# Patient Record
Sex: Female | Born: 1980
Health system: Southern US, Community
[De-identification: ages and names within clinical notes are randomized; demographics above are authoritative.]

## PROBLEM LIST (undated history)

## (undated) DIAGNOSIS — Z91018 Allergy to other foods: Secondary | ICD-10-CM

## (undated) DIAGNOSIS — M791 Myalgia, unspecified site: Secondary | ICD-10-CM

## (undated) DIAGNOSIS — I889 Nonspecific lymphadenitis, unspecified: Secondary | ICD-10-CM

## (undated) DIAGNOSIS — N83209 Unspecified ovarian cyst, unspecified side: Secondary | ICD-10-CM

## (undated) DIAGNOSIS — R5383 Other fatigue: Secondary | ICD-10-CM

## (undated) DIAGNOSIS — G8929 Other chronic pain: Secondary | ICD-10-CM

## (undated) DIAGNOSIS — R519 Headache, unspecified: Secondary | ICD-10-CM

## (undated) DIAGNOSIS — J40 Bronchitis, not specified as acute or chronic: Secondary | ICD-10-CM

## (undated) DIAGNOSIS — M797 Fibromyalgia: Secondary | ICD-10-CM

## (undated) DIAGNOSIS — R51 Headache: Secondary | ICD-10-CM

## (undated) DIAGNOSIS — R5382 Chronic fatigue, unspecified: Secondary | ICD-10-CM

## (undated) DIAGNOSIS — F17200 Nicotine dependence, unspecified, uncomplicated: Secondary | ICD-10-CM

## (undated) HISTORY — DX: Allergy to other foods: Z91.018

## (undated) HISTORY — DX: Other fatigue: R53.83

## (undated) HISTORY — DX: Fibromyalgia: M79.7

## (undated) HISTORY — PX: OVARIAN CYST SURGERY: SHX726

## (undated) HISTORY — DX: Nonspecific lymphadenitis, unspecified: I88.9

## (undated) HISTORY — PX: TUBAL LIGATION: SHX77

## (undated) HISTORY — DX: Chronic fatigue, unspecified: R53.82

## (undated) HISTORY — DX: Myalgia, unspecified site: M79.10

## (undated) HISTORY — DX: Nicotine dependence, unspecified, uncomplicated: F17.200

## (undated) HISTORY — PX: APPENDECTOMY: SHX54

---

## 2000-07-27 ENCOUNTER — Emergency Department (HOSPITAL_COMMUNITY): Admission: EM | Admit: 2000-07-27 | Discharge: 2000-07-28 | Payer: Self-pay | Admitting: Internal Medicine

## 2000-07-29 ENCOUNTER — Ambulatory Visit (HOSPITAL_COMMUNITY): Admission: RE | Admit: 2000-07-29 | Discharge: 2000-07-29 | Payer: Self-pay | Admitting: Internal Medicine

## 2000-07-29 ENCOUNTER — Encounter: Payer: Self-pay | Admitting: Internal Medicine

## 2000-09-25 ENCOUNTER — Emergency Department (HOSPITAL_COMMUNITY): Admission: EM | Admit: 2000-09-25 | Discharge: 2000-09-26 | Payer: Self-pay | Admitting: *Deleted

## 2000-09-26 ENCOUNTER — Encounter: Payer: Self-pay | Admitting: *Deleted

## 2000-10-09 ENCOUNTER — Emergency Department (HOSPITAL_COMMUNITY): Admission: EM | Admit: 2000-10-09 | Discharge: 2000-10-09 | Payer: Self-pay | Admitting: *Deleted

## 2000-10-09 ENCOUNTER — Encounter: Payer: Self-pay | Admitting: *Deleted

## 2000-10-22 ENCOUNTER — Emergency Department (HOSPITAL_COMMUNITY): Admission: EM | Admit: 2000-10-22 | Discharge: 2000-10-22 | Payer: Self-pay | Admitting: Emergency Medicine

## 2001-01-23 ENCOUNTER — Emergency Department (HOSPITAL_COMMUNITY): Admission: EM | Admit: 2001-01-23 | Discharge: 2001-01-23 | Payer: Self-pay | Admitting: Emergency Medicine

## 2001-05-22 ENCOUNTER — Emergency Department (HOSPITAL_COMMUNITY): Admission: EM | Admit: 2001-05-22 | Discharge: 2001-05-22 | Payer: Self-pay | Admitting: Emergency Medicine

## 2001-05-24 ENCOUNTER — Emergency Department (HOSPITAL_COMMUNITY): Admission: EM | Admit: 2001-05-24 | Discharge: 2001-05-24 | Payer: Self-pay | Admitting: *Deleted

## 2001-07-05 ENCOUNTER — Emergency Department (HOSPITAL_COMMUNITY): Admission: EM | Admit: 2001-07-05 | Discharge: 2001-07-06 | Payer: Self-pay | Admitting: Emergency Medicine

## 2001-07-29 ENCOUNTER — Encounter: Payer: Self-pay | Admitting: *Deleted

## 2001-07-29 ENCOUNTER — Emergency Department (HOSPITAL_COMMUNITY): Admission: EM | Admit: 2001-07-29 | Discharge: 2001-07-29 | Payer: Self-pay | Admitting: *Deleted

## 2001-10-12 ENCOUNTER — Observation Stay (HOSPITAL_COMMUNITY): Admission: AD | Admit: 2001-10-12 | Discharge: 2001-10-14 | Payer: Self-pay | Admitting: Obstetrics & Gynecology

## 2001-12-30 ENCOUNTER — Emergency Department (HOSPITAL_COMMUNITY): Admission: EM | Admit: 2001-12-30 | Discharge: 2001-12-30 | Payer: Self-pay | Admitting: Emergency Medicine

## 2002-06-17 ENCOUNTER — Ambulatory Visit (HOSPITAL_COMMUNITY): Admission: AD | Admit: 2002-06-17 | Discharge: 2002-06-17 | Payer: Self-pay | Admitting: Obstetrics & Gynecology

## 2002-06-27 ENCOUNTER — Observation Stay (HOSPITAL_COMMUNITY): Admission: RE | Admit: 2002-06-27 | Discharge: 2002-06-28 | Payer: Self-pay | Admitting: Obstetrics & Gynecology

## 2002-06-30 ENCOUNTER — Observation Stay (HOSPITAL_COMMUNITY): Admission: RE | Admit: 2002-06-30 | Discharge: 2002-07-01 | Payer: Self-pay | Admitting: Obstetrics & Gynecology

## 2002-07-08 ENCOUNTER — Ambulatory Visit (HOSPITAL_COMMUNITY): Admission: AD | Admit: 2002-07-08 | Discharge: 2002-07-08 | Payer: Self-pay | Admitting: Obstetrics & Gynecology

## 2002-07-22 ENCOUNTER — Inpatient Hospital Stay (HOSPITAL_COMMUNITY): Admission: RE | Admit: 2002-07-22 | Discharge: 2002-07-24 | Payer: Self-pay | Admitting: Obstetrics & Gynecology

## 2002-11-06 ENCOUNTER — Encounter: Payer: Self-pay | Admitting: Obstetrics and Gynecology

## 2002-11-06 ENCOUNTER — Ambulatory Visit (HOSPITAL_COMMUNITY): Admission: RE | Admit: 2002-11-06 | Discharge: 2002-11-06 | Payer: Self-pay | Admitting: Obstetrics and Gynecology

## 2011-01-10 ENCOUNTER — Emergency Department (HOSPITAL_COMMUNITY): Payer: Self-pay

## 2011-01-10 ENCOUNTER — Emergency Department (HOSPITAL_COMMUNITY)
Admission: EM | Admit: 2011-01-10 | Discharge: 2011-01-10 | Disposition: A | Discharge status: Home / Self Care | Payer: Self-pay | Attending: Emergency Medicine | Admitting: Emergency Medicine

## 2011-01-10 ENCOUNTER — Encounter: Payer: Self-pay | Admitting: Emergency Medicine

## 2011-01-10 DIAGNOSIS — N390 Urinary tract infection, site not specified: Secondary | ICD-10-CM | POA: Insufficient documentation

## 2011-01-10 DIAGNOSIS — R51 Headache: Secondary | ICD-10-CM

## 2011-01-10 DIAGNOSIS — J111 Influenza due to unidentified influenza virus with other respiratory manifestations: Secondary | ICD-10-CM | POA: Insufficient documentation

## 2011-01-10 DIAGNOSIS — S0990XA Unspecified injury of head, initial encounter: Secondary | ICD-10-CM | POA: Insufficient documentation

## 2011-01-10 DIAGNOSIS — IMO0002 Reserved for concepts with insufficient information to code with codable children: Secondary | ICD-10-CM | POA: Insufficient documentation

## 2011-01-10 DIAGNOSIS — R059 Cough, unspecified: Secondary | ICD-10-CM | POA: Insufficient documentation

## 2011-01-10 DIAGNOSIS — R42 Dizziness and giddiness: Secondary | ICD-10-CM | POA: Insufficient documentation

## 2011-01-10 DIAGNOSIS — F17200 Nicotine dependence, unspecified, uncomplicated: Secondary | ICD-10-CM | POA: Insufficient documentation

## 2011-01-10 DIAGNOSIS — R05 Cough: Secondary | ICD-10-CM | POA: Insufficient documentation

## 2011-01-10 HISTORY — DX: Unspecified ovarian cyst, unspecified side: N83.209

## 2011-01-10 LAB — URINALYSIS, ROUTINE W REFLEX MICROSCOPIC
Bilirubin Urine: NEGATIVE
Glucose, UA: NEGATIVE mg/dL
Ketones, ur: NEGATIVE mg/dL
Protein, ur: NEGATIVE mg/dL
pH: 6 (ref 5.0–8.0)

## 2011-01-10 LAB — COMPREHENSIVE METABOLIC PANEL
BUN: 7 mg/dL (ref 6–23)
CO2: 30 mEq/L (ref 19–32)
Chloride: 98 mEq/L (ref 96–112)
Creatinine, Ser: 0.57 mg/dL (ref 0.50–1.10)
GFR calc Af Amer: >90 mL/min (ref >90)
GFR calc non Af Amer: >90 mL/min (ref >90)
Total Bilirubin: 0.6 mg/dL (ref 0.3–1.2)

## 2011-01-10 LAB — POCT PREGNANCY, URINE: Preg Test, Ur: NEGATIVE

## 2011-01-10 LAB — CBC
HCT: 40.1 % (ref 36.0–46.0)
MCH: 32.1 pg (ref 26.0–34.0)
MCV: 89.5 fL (ref 78.0–100.0)
RDW: 12.8 % (ref 11.5–15.5)
WBC: 3.7 10*3/uL — ABNORMAL LOW (ref 4.0–10.5)

## 2011-01-10 LAB — DIFFERENTIAL
Basophils Absolute: 0 10*3/uL (ref 0.0–0.1)
Eosinophils Absolute: 0 10*3/uL (ref 0.0–0.7)
Eosinophils Relative: 1 % (ref 0–5)
Lymphocytes Relative: 37 % (ref 12–46)
Lymphs Abs: 1.3 10*3/uL (ref 0.7–4.0)
Monocytes Absolute: 0.6 10*3/uL (ref 0.1–1.0)

## 2011-01-10 LAB — URINE MICROSCOPIC-ADD ON

## 2011-01-10 MED ORDER — SODIUM CHLORIDE 0.9 % IV SOLN: INTRAVENOUS | Status: DC

## 2011-01-10 MED ORDER — ALBUTEROL SULFATE HFA 108 (90 BASE) MCG/ACT IN AERS: 2.0000 | INHALATION_SPRAY | RESPIRATORY_TRACT | Status: DC | PRN

## 2011-01-10 MED ORDER — PREDNISONE 20 MG PO TABS
60.0000 mg | ORAL_TABLET | Freq: Once | ORAL | Status: AC
  Administered 2011-01-10: 60 mg via ORAL
  Filled 2011-01-10: qty 3

## 2011-01-10 MED ORDER — ALBUTEROL SULFATE (5 MG/ML) 0.5% IN NEBU
2.5000 mg | INHALATION_SOLUTION | Freq: Once | RESPIRATORY_TRACT | Status: AC
  Administered 2011-01-10: 2.5 mg via RESPIRATORY_TRACT
  Filled 2011-01-10: qty 0.5

## 2011-01-10 MED ORDER — SODIUM CHLORIDE 0.9 % IV BOLUS (SEPSIS)
2000.0000 mL | Freq: Once | INTRAVENOUS | Status: AC
  Administered 2011-01-10: 2000 mL via INTRAVENOUS

## 2011-01-10 MED ORDER — METOCLOPRAMIDE HCL 10 MG PO TABS: 10.0000 mg | ORAL_TABLET | Freq: Four times a day (QID) | ORAL | Status: AC | PRN

## 2011-01-10 MED ORDER — PREDNISONE 20 MG PO TABS: ORAL_TABLET | ORAL | Status: AC

## 2011-01-10 MED ORDER — FENTANYL CITRATE 0.05 MG/ML IJ SOLN
100.0000 ug | Freq: Once | INTRAMUSCULAR | Status: AC
  Administered 2011-01-10: 100 ug via INTRAVENOUS
  Filled 2011-01-10: qty 2

## 2011-01-10 MED ORDER — CEPHALEXIN 500 MG PO CAPS: ORAL_CAPSULE | ORAL | Status: AC

## 2011-01-10 MED ORDER — METOCLOPRAMIDE HCL 5 MG/ML IJ SOLN
10.0000 mg | Freq: Once | INTRAMUSCULAR | Status: AC
  Administered 2011-01-10: 10 mg via INTRAVENOUS
  Filled 2011-01-10: qty 2

## 2011-01-10 NOTE — ED Notes (Signed)
Patient reports generalized body aches, dizziness, and nose pain for 4 days. Patient reports she hit her nose with a door Thursday. Swelling and bruising noted to nose. Patient c/o chills, denies fever. Patient c/o headache since hitting her head, Pupils 5mm and PERRL.

## 2011-01-10 NOTE — ED Provider Notes (Signed)
History   This chart was scribed for Lisa Horn, MD by Clarita Crane. The patient was seen in room APA01/APA01 and the patient's care was started at 10:12AM.   CSN: [id number] Arrival date & time: 01/10/2011  9:36 AM   First MD Initiated Contact with Patient 01/10/11 1009      Chief Complaint  Patient presents with  . Dizziness  . Generalized Body Aches  . Facial Injury    (Consider location/radiation/quality/duration/timing/severity/associated sxs/prior treatment) HPI Lisa Fields is a 30 y.o. female who presents to the Emergency Department complaining of constant moderate to severe flu-like symptoms including fever, sore throat, cough, congestion, myalgias, arthralgias, fatigue, decreased appetite and generalized weakness onset 4 days ago and persistent since. Patient also reports sustaining facial/head injury 3 days ago when she accidentally walked into a door. Denies chest pain, SOB, abdominal pain, States after she was struck by the door she had awaken on the floor by her child. Notes experiencing moderate to severe nasal pain and a constant throbbing HA with associated photophobia since injury was sustained. Also indicates experiencing nausea and vomiting initially following incident but states n/v resolved 2 days ago. Family member states patient has also c/o numbness/tingling of bilateral upper and lower extremities intermittently since the incident occurred. Denies confusion, changes in vision, diplopia.  Past Medical History  Diagnosis Date  . Ovarian cyst     Past Surgical History  Procedure Date  . Tubal ligation   . Appendectomy   . Ovarian cyst surgery     No family history on file.  History  Substance Use Topics  . Smoking status: Current Everyday Smoker -- 0.5 packs/day    Types: Cigarettes  . Smokeless tobacco: Not on file  . Alcohol Use: No    OB History    Grav Para Term Preterm Abortions TAB SAB Ect Mult Living                  Review of  Systems  Constitutional: Negative for fever.       10 Systems reviewed and are negative for acute change except as noted in the HPI.  HENT: Positive for congestion, sore throat and rhinorrhea.        Facial injury.   Eyes: Positive for photophobia. Negative for discharge, redness and visual disturbance.  Respiratory: Positive for cough. Negative for shortness of breath.   Cardiovascular: Negative for chest pain.  Gastrointestinal: Positive for nausea and vomiting. Negative for abdominal pain and diarrhea.  Musculoskeletal: Positive for myalgias and arthralgias. Negative for back pain.  Skin: Negative for rash.  Neurological: Positive for dizziness, weakness, numbness and headaches. Negative for syncope.       Possible LOC  Psychiatric/Behavioral: Negative for confusion.       No behavior change.    Allergies  Ivp dye; Sulfa antibiotics; Codeine; Latex; Morphine and related; and Vicodin  Home Medications   Current Outpatient Rx  Name Route Sig Dispense Refill  . DM-GUAIFENESIN ER 30-600 MG PO TB12 Oral Take 1 tablet by mouth every 12 (twelve) hours.      . IBUPROFEN 800 MG PO TABS Oral Take 800 mg by mouth every 8 (eight) hours as needed. For headaches     . THERA M PLUS PO TABS Oral Take 1 tablet by mouth daily.      . ALBUTEROL SULFATE HFA 108 (90 BASE) MCG/ACT IN AERS Inhalation Inhale 2 puffs into the lungs every 2 (two) hours as needed for wheezing or shortness  of breath (cough). 1 Inhaler 0  . CEPHALEXIN 500 MG PO CAPS  2 caps po bid x 3 days 12 capsule 0  . METOCLOPRAMIDE HCL 10 MG PO TABS Oral Take 1 tablet (10 mg total) by mouth every 6 (six) hours as needed (nausea/headache). 6 tablet 0  . PREDNISONE 20 MG PO TABS  2 tabs po daily x 4 days 8 tablet 0    BP 107/65  Pulse 88  Temp(Src) 98 F (36.7 C) (Oral)  Resp 18  Ht 5\' 9"  (1.753 m)  Wt 130 lb (58.968 kg)  BMI 19.20 kg/m2  SpO2 100%  LMP 01/03/2011  Physical Exam  Nursing note and vitals  reviewed. Constitutional: She is oriented to person, place, and time. She appears well-developed and well-nourished.       Awake, alert, nontoxic appearance.  HENT:  Head: Normocephalic and atraumatic.  Mouth/Throat: Oropharynx is clear and moist. No oropharyngeal exudate.       Nasal bridge is tender but no deformity. No septal hematoma or edema.   Eyes: Conjunctivae and EOM are normal. Pupils are equal, round, and reactive to light. Right eye exhibits no discharge. Left eye exhibits no discharge.       Peripheral vision intact.   Neck: Neck supple.       Neck non-tender.   Cardiovascular: Normal rate and regular rhythm.   No murmur heard. Pulmonary/Chest: Effort normal. No stridor. No respiratory distress. She has wheezes. She has no rales. She exhibits no tenderness.       Scattered rhonchi and wheezes throughout all fields.   Abdominal: Soft. Bowel sounds are normal. She exhibits no mass. There is no tenderness. There is no rebound.  Musculoskeletal: Normal range of motion. She exhibits tenderness.       Baseline ROM, no obvious new focal weakness. No C-spine tenderness. Back diffusely tender. Upper and lower extremities diffusely tender.   Lymphadenopathy:    She has no cervical adenopathy.  Neurological: She is alert and oriented to person, place, and time.       Mental status and motor strength appears baseline for patient and situation. Distal sensation intact. Awake, alert, cooperative and aware of situation; motor strength bilaterally; sensation normal to light touch bilaterally; peripheral visual fields full to confrontation; no facial asymmetry; tongue midline; major cranial nerves appear intact; no pronator drift, normal finger to nose bilaterally.  Skin: Skin is warm and dry. No rash noted.  Psychiatric: She has a normal mood and affect.    ED Course  Procedures (including critical care time)  DIAGNOSTIC STUDIES: Oxygen Saturation is 100% on room air, normal by my  interpretation.    COORDINATION OF CARE: 10:20AM- Patient and family member informed of intent to obtain CT- Head, CXR and additional labs as well as treat current symptoms with medications and nebulizer treatment. Patient agreeable to plan. 1:36PM- PAtient reports she is feeling better at this time following administration of albuterol, Prednisone, IVFs, Reglan and fentanyl via IV. Informed of intent to d/c home with prescription for Reglan. Patient refuses offer to prescribe narcotic pain medication. Patient agrees with current plan set forth.  Pt feels improved after observation and/or treatment in ED.Patient / Family / Caregiver informed of clinical course, understand medical decision-making process, and agree with plan.  Labs Reviewed  CBC - Abnormal; Notable for the following:    WBC 3.7 (*)    All other components within normal limits  DIFFERENTIAL - Abnormal; Notable for the following:  Monocytes Relative 16 (*)    All other components within normal limits  URINALYSIS, ROUTINE W REFLEX MICROSCOPIC - Abnormal; Notable for the following:    Leukocytes, UA TRACE (*)    All other components within normal limits  URINE MICROSCOPIC-ADD ON - Abnormal; Notable for the following:    Squamous Epithelial / LPF MANY (*)    Bacteria, UA MANY (*)    All other components within normal limits  COMPREHENSIVE METABOLIC PANEL  POCT PREGNANCY, URINE   Dg Chest 2 View  01/10/2011  *RADIOLOGY REPORT*  Clinical Data: Cough and headache.  Fall.  Dizziness  CHEST - 2 VIEW  Comparison: None  Findings: The heart size and mediastinal contours are within normal limits.  Both lungs are clear.  The visualized skeletal structures are unremarkable.  IMPRESSION: No active cardiopulmonary abnormalities.  Original Report Authenticated By: Rosealee Albee, M.D.   Ct Head Wo Contrast  01/10/2011  *RADIOLOGY REPORT*  Clinical Data: Dizziness.  Fall for 3 days.  CT HEAD WITHOUT CONTRAST  Technique:   Contiguous axial images were obtained from the base of the skull through the vertex without contrast.  Comparison: None  Findings: The brain has a normal appearance without evidence for hemorrhage, infarction, hydrocephalus, or mass lesion.  There is no extra axial fluid collection. The skull appears intact.  There is mild mucosal thickening involving the maxillary sinuses and ethmoid air cells.  IMPRESSION:  1. No acute intracranial abnormalities. 2.  Mucosal thickening within the sinuses.  Original Report Authenticated By: Rosealee Albee, M.D.     1. Influenza   2. Headache   3. Minor head injury   4. Urinary tract infection       MDM   Ct scan, CXR, medications to treat symptoms, nebulizer treatment.  I doubt any other EMC precluding discharge at this time including, but not necessarily limited to the following:sepsis, meningitis.  I personally performed the services described in this documentation, which was scribed in my presence. The recorded information has been reviewed and considered.    Lisa Horn, MD 01/10/11 2111

## 2011-01-10 NOTE — ED Notes (Signed)
Pt reports hit nose with door 4 days ago.  C/O headache, dizziness, and nose pain.  Pt also reports nonproductive cough, fever, and body aches as well.  Pt alert and oriented.  C/O headache, pupils equal and reactive.  Bruising noted across bridge of nose.

## 2012-06-10 ENCOUNTER — Emergency Department (HOSPITAL_COMMUNITY)
Admission: EM | Admit: 2012-06-10 | Discharge: 2012-06-10 | Disposition: A | Discharge status: Home / Self Care | Payer: PRIVATE HEALTH INSURANCE | Attending: Emergency Medicine | Admitting: Emergency Medicine

## 2012-06-10 ENCOUNTER — Encounter (HOSPITAL_COMMUNITY): Payer: Self-pay

## 2012-06-10 ENCOUNTER — Emergency Department (HOSPITAL_COMMUNITY): Payer: PRIVATE HEALTH INSURANCE

## 2012-06-10 DIAGNOSIS — Y93F9 Activity, other caregiving: Secondary | ICD-10-CM | POA: Insufficient documentation

## 2012-06-10 DIAGNOSIS — S9000XA Contusion of unspecified ankle, initial encounter: Secondary | ICD-10-CM | POA: Insufficient documentation

## 2012-06-10 DIAGNOSIS — Z8742 Personal history of other diseases of the female genital tract: Secondary | ICD-10-CM | POA: Insufficient documentation

## 2012-06-10 DIAGNOSIS — Z91040 Latex allergy status: Secondary | ICD-10-CM | POA: Insufficient documentation

## 2012-06-10 DIAGNOSIS — F17200 Nicotine dependence, unspecified, uncomplicated: Secondary | ICD-10-CM | POA: Insufficient documentation

## 2012-06-10 DIAGNOSIS — Z79899 Other long term (current) drug therapy: Secondary | ICD-10-CM | POA: Insufficient documentation

## 2012-06-10 DIAGNOSIS — Y990 Civilian activity done for income or pay: Secondary | ICD-10-CM | POA: Insufficient documentation

## 2012-06-10 DIAGNOSIS — S9002XA Contusion of left ankle, initial encounter: Secondary | ICD-10-CM

## 2012-06-10 DIAGNOSIS — IMO0002 Reserved for concepts with insufficient information to code with codable children: Secondary | ICD-10-CM | POA: Insufficient documentation

## 2012-06-10 DIAGNOSIS — Y9289 Other specified places as the place of occurrence of the external cause: Secondary | ICD-10-CM | POA: Insufficient documentation

## 2012-06-10 NOTE — ED Provider Notes (Signed)
History  This chart was scribed for Geoffery Lyons, MD by Bennett Scrape, ED Scribe. This patient was seen in room APA03/APA03 and the patient's care was started at 4:50 PM.  CSN: [id number]  Arrival date & time 06/10/12  1619   First MD Initiated Contact with Patient 06/10/12 1649      Chief Complaint  Patient presents with  . Ankle Pain    The history is provided by the patient. No language interpreter was used.    HPI Comments: 4:50 PM Lisa Fields is a 32 y.o. female who presents to the Emergency Department complaining of sudden onset, non-changing, constant left ankle pain described as shooting after a wheel chair tipped over onto her foot. Pt states was transferring a pt at work today when the wheelchair tipped over and landed on her ankle. She reports that the pain is worse with ambulation but denies difficulty ambulating. She reports that there was swelling with the original onset of the injury but this has resolved with the application of ice. She denies any other injuries currently. Pt does not have a h/o chronic medical conditions. Pt is a current everyday smoker but denies alcohol use.  Works as a Licensed conveyancer at [employer]  Past Medical History  Diagnosis Date  . Ovarian cyst     Past Surgical History  Procedure Laterality Date  . Tubal ligation    . Appendectomy    . Ovarian cyst surgery      No family history on file.  History  Substance Use Topics  . Smoking status: Current Every Day Smoker -- 0.50 packs/day    Types: Cigarettes  . Smokeless tobacco: Not on file  . Alcohol Use: No    No OB history provided.  Review of Systems  HENT: Negative for neck pain.   Musculoskeletal: Positive for arthralgias (left ankle pain). Negative for back pain.  Skin: Negative for wound.    Allergies  Ivp dye; Sulfa antibiotics; Codeine; Latex; Morphine and related; and Vicodin  Home Medications   Current Outpatient Rx  Name  Route  Sig   Dispense  Refill  . ibuprofen (ADVIL,MOTRIN) 800 MG tablet   Oral   Take 800 mg by mouth every 8 (eight) hours as needed. For headaches          . Multiple Vitamins-Minerals (MULTIVITAMINS THER. W/MINERALS) TABS   Oral   Take 1 tablet by mouth daily.           Marland Kitchen EXPIRED: albuterol (PROVENTIL HFA;VENTOLIN HFA) 108 (90 BASE) MCG/ACT inhaler   Inhalation   Inhale 2 puffs into the lungs every 2 (two) hours as needed for wheezing or shortness of breath (cough).   1 Inhaler   0     Triage Vitals: BP 107/80  Pulse 106  Temp(Src) 97 F (36.1 C) (Oral)  Resp 20  SpO2 100%  LMP 05/27/2012  Physical Exam  Nursing note and vitals reviewed. Constitutional: She is oriented to person, place, and time. She appears well-developed and well-nourished. No distress.  HENT:  Head: Normocephalic and atraumatic.  Eyes: EOM are normal.  Neck: Neck supple. No tracheal deviation present.  Cardiovascular: Normal rate.   Pulmonary/Chest: Effort normal. No respiratory distress.  Musculoskeletal: Normal range of motion.  Small contusion to medial aspect of left ankle. Tender to palpate. No deformity, and appears otherwise grossly normal. Distal pulsles and sensation are intact.   Neurological: She is alert and oriented to person, place, and time.  Skin:  Skin is warm and dry.  Psychiatric: She has a normal mood and affect. Her behavior is normal.    ED Course  Procedures (including critical care time)  DIAGNOSTIC STUDIES: Oxygen Saturation is 100% on room air, normal by my interpretation.    COORDINATION OF CARE: 4:53 PM- Informed pt of radiology results. Discussed discharge plan which includes ACE bandage, ice and rest with pt and pt agreed to plan. Also advised pt to follow up as needed and pt agreed. Addressed symptoms to return for with pt.    Labs Reviewed - No data to display  Dg Ankle Complete Left  06/10/2012   *RADIOLOGY REPORT*  Clinical Data: Ankle pain  LEFT ANKLE COMPLETE - 3+  VIEW  Comparison: None  Findings: There is no evidence of fracture or dislocation.  There is no evidence of arthropathy or other focal bone abnormality. Soft tissues are unremarkable.  IMPRESSION: Negative exam.   Original Report Authenticated By: Signa Kell, M.D.     No diagnosis found.    MDM  No fractures on xray.  Will treat as sprain / contusion with rest, ice, ace, motrin.  Follow up prn.         I personally performed the services described in this documentation, which was scribed in my presence. The recorded information has been reviewed and is accurate.        Geoffery Lyons, MD 06/11/12 [phone]

## 2012-06-10 NOTE — ED Notes (Signed)
Pt was transferring a pt at the [facility], the wheelchair w/ a pt in it landed on the pt's left ankle.  Pt having sever pain and numbness and shooting pain.  Swelling noted to left foot/ankle.

## 2012-07-30 ENCOUNTER — Encounter (HOSPITAL_COMMUNITY): Payer: Self-pay | Admitting: Emergency Medicine

## 2012-07-30 ENCOUNTER — Emergency Department (HOSPITAL_COMMUNITY)
Admission: EM | Admit: 2012-07-30 | Discharge: 2012-07-30 | Disposition: A | Discharge status: Home / Self Care | Payer: Self-pay | Attending: Emergency Medicine | Admitting: Emergency Medicine

## 2012-07-30 ENCOUNTER — Emergency Department (HOSPITAL_COMMUNITY): Payer: Self-pay

## 2012-07-30 DIAGNOSIS — R110 Nausea: Secondary | ICD-10-CM | POA: Insufficient documentation

## 2012-07-30 DIAGNOSIS — R5383 Other fatigue: Secondary | ICD-10-CM | POA: Insufficient documentation

## 2012-07-30 DIAGNOSIS — F17200 Nicotine dependence, unspecified, uncomplicated: Secondary | ICD-10-CM | POA: Insufficient documentation

## 2012-07-30 DIAGNOSIS — R51 Headache: Secondary | ICD-10-CM | POA: Insufficient documentation

## 2012-07-30 DIAGNOSIS — Z8742 Personal history of other diseases of the female genital tract: Secondary | ICD-10-CM | POA: Insufficient documentation

## 2012-07-30 DIAGNOSIS — R5381 Other malaise: Secondary | ICD-10-CM | POA: Insufficient documentation

## 2012-07-30 DIAGNOSIS — R630 Anorexia: Secondary | ICD-10-CM | POA: Insufficient documentation

## 2012-07-30 DIAGNOSIS — J069 Acute upper respiratory infection, unspecified: Secondary | ICD-10-CM

## 2012-07-30 DIAGNOSIS — Z91040 Latex allergy status: Secondary | ICD-10-CM | POA: Insufficient documentation

## 2012-07-30 DIAGNOSIS — R059 Cough, unspecified: Secondary | ICD-10-CM | POA: Insufficient documentation

## 2012-07-30 DIAGNOSIS — Z79899 Other long term (current) drug therapy: Secondary | ICD-10-CM | POA: Insufficient documentation

## 2012-07-30 DIAGNOSIS — K047 Periapical abscess without sinus: Secondary | ICD-10-CM

## 2012-07-30 DIAGNOSIS — K089 Disorder of teeth and supporting structures, unspecified: Secondary | ICD-10-CM | POA: Insufficient documentation

## 2012-07-30 DIAGNOSIS — R0789 Other chest pain: Secondary | ICD-10-CM | POA: Insufficient documentation

## 2012-07-30 DIAGNOSIS — R6883 Chills (without fever): Secondary | ICD-10-CM | POA: Insufficient documentation

## 2012-07-30 DIAGNOSIS — R05 Cough: Secondary | ICD-10-CM | POA: Insufficient documentation

## 2012-07-30 LAB — CBC WITH DIFFERENTIAL/PLATELET
Basophils Absolute: 0 10*3/uL (ref 0.0–0.1)
Eosinophils Relative: 1 % (ref 0–5)
HCT: 36.5 % (ref 36.0–46.0)
Lymphocytes Relative: 12 % (ref 12–46)
MCH: 33.7 pg (ref 26.0–34.0)
MCHC: 36.7 g/dL — ABNORMAL HIGH (ref 30.0–36.0)
MCV: 91.7 fL (ref 78.0–100.0)
Monocytes Absolute: 0.7 10*3/uL (ref 0.1–1.0)
RDW: 12.4 % (ref 11.5–15.5)

## 2012-07-30 LAB — BASIC METABOLIC PANEL
CO2: 28 mEq/L (ref 19–32)
Calcium: 9.1 mg/dL (ref 8.4–10.5)
Creatinine, Ser: 0.92 mg/dL (ref 0.50–1.10)

## 2012-07-30 MED ORDER — SODIUM CHLORIDE 0.9 % IV SOLN: INTRAVENOUS | Status: DC

## 2012-07-30 MED ORDER — ONDANSETRON HCL 4 MG/2ML IJ SOLN
4.0000 mg | Freq: Once | INTRAMUSCULAR | Status: AC
  Administered 2012-07-30: 4 mg via INTRAVENOUS
  Filled 2012-07-30: qty 2

## 2012-07-30 MED ORDER — SODIUM CHLORIDE 0.9 % IV BOLUS (SEPSIS): 1000.0000 mL | Freq: Once | INTRAVENOUS | Status: DC

## 2012-07-30 MED ORDER — CLINDAMYCIN HCL 300 MG PO CAPS: 300.0000 mg | ORAL_CAPSULE | Freq: Three times a day (TID) | ORAL | Status: DC

## 2012-07-30 NOTE — ED Notes (Signed)
zofran given- scanner not working

## 2012-07-30 NOTE — ED Notes (Signed)
Pt c/o sudden sore throat and jaw pain. Has current dental abscess, supposed to have surgery on tooth tomorrow. gen weakness noted. Hx of strep. Denies urinary /gi sx's.

## 2012-07-30 NOTE — ED Provider Notes (Signed)
History    This chart was scribed for Shelda Jakes, MD, MD by Ashley Jacobs, ED Scribe. The patient was seen in room APA10/APA10 and the patient's care was started at 6:45 PM.  CSN: [id number] Arrival date & time 07/30/12  1824     Chief Complaint  Patient presents with  . Sore Throat  . Weakness    The history is provided by the patient, a significant other and medical records. No language interpreter was used.   HPI Comments: Lisa Fields is a 32 y.o. female who presents to the Emergency Department complaining of moderate constant pain in R upper wisdom tooth with onset several weeks ago and progressively worsened 2 days pta.  Pt also complains of severe cough and sore throat that presented yesterday that is gradually worsening.  Pt was dx with an abscess R upper wisdom tooth and is currently taking ibuprofen for pain with no relief. Pt reports bilateral cranial pressure towards her forehead region. Pt reports that she is allergic to codeine, Vicodin, sulfa antibiotics, latex, and IVP dye( developed streaks if administered). Pt reports watering eyes,weakness, feeling cold and" sour bitter taste" in mouth. Pt spouse reports decreased eating and drinking. Pt reports that she has no problem with tramadol.Pt reports having strep several times.Pt's PCP is Dr. Nolon Bussing. Pt has an appt with dentist tomorrow. Past Medical History  Diagnosis Date  . Ovarian cyst    Past Surgical History  Procedure Laterality Date  . Tubal ligation    . Appendectomy    . Ovarian cyst surgery     History reviewed. No pertinent family history. History  Substance Use Topics  . Smoking status: Current Every Day Smoker -- 0.50 packs/day    Types: Cigarettes  . Smokeless tobacco: Not on file  . Alcohol Use: No   OB History   Grav Para Term Preterm Abortions TAB SAB Ect Mult Living                 Review of Systems  Constitutional: Positive for chills, activity change and appetite  change. Negative for fever and fatigue.  HENT: Positive for sore throat. Negative for congestion, rhinorrhea and neck pain.   Eyes: Negative for visual disturbance.  Respiratory: Positive for cough.   Cardiovascular: Positive for chest pain (hruts when breathing). Negative for leg swelling.  Gastrointestinal: Positive for nausea. Negative for vomiting, abdominal pain and diarrhea.  Genitourinary: Negative for dysuria.  Musculoskeletal: Negative for back pain.  Skin: Negative for rash.  Neurological: Positive for headaches.  Hematological: Does not bruise/bleed easily.    Allergies  Ivp dye; Sulfa antibiotics; Codeine; Latex; Morphine and related; and Vicodin  Home Medications   Current Outpatient Rx  Name  Route  Sig  Dispense  Refill  . ibuprofen (ADVIL,MOTRIN) 800 MG tablet   Oral   Take 800 mg by mouth every 8 (eight) hours as needed. For headaches          . penicillin v potassium (VEETID) 500 MG tablet   Oral   Take 500 mg by mouth 4 (four) times daily. (Started on 07/22/2012)         . Phenylephrine-Ibuprofen (ADVIL CONGESTION RELIEF PO)   Oral   Take 2 tablets by mouth every 6 (six) hours as needed. pain         . clindamycin (CLEOCIN) 300 MG capsule   Oral   Take 1 capsule (300 mg total) by mouth 3 (three) times daily.   30  capsule   0    BP 107/66  Pulse 92  Temp(Src) 98.7 F (37.1 C) (Oral)  Resp 17  Ht 5\' 9"  (1.753 m)  Wt 138 lb 2 oz (62.653 kg)  BMI 20.39 kg/m2  SpO2 100%  LMP 07/25/2012 Physical Exam  Nursing note and vitals reviewed. Constitutional: She is oriented to person, place, and time. She appears well-developed. No distress.  HENT:  Mouth/Throat: No oropharyngeal exudate.  Swelling around r upper wisdom tooth  Eyes: Conjunctivae and EOM are normal. Pupils are equal, round, and reactive to light.  Neck: Normal range of motion.  Cardiovascular: Normal rate and regular rhythm.   Abdominal: Soft. Bowel sounds are normal. There is no  tenderness.  Musculoskeletal: She exhibits no edema.  Lymphadenopathy:    She has no cervical adenopathy.  Neurological: She is alert and oriented to person, place, and time. No cranial nerve deficit. She exhibits normal muscle tone. Coordination normal.  Skin: Skin is warm. No rash noted.  Psychiatric: She has a normal mood and affect. Her behavior is normal.    ED Course  Procedures (including critical care time) DIAGNOSTIC STUDIES: Oxygen Saturation is 100% on room air, normal by my interpretation.    COORDINATION OF CARE: 6:46 PM Discussed course of care with pt which includes anti nausea medication,  . Pt understands and agrees.  9:35 Recheck. Pt report feeling better.   Labs Reviewed  CBC WITH DIFFERENTIAL - Abnormal; Notable for the following:    MCHC 36.7 (*)    Neutrophils Relative % 80 (*)    All other components within normal limits  BASIC METABOLIC PANEL - Abnormal; Notable for the following:    GFR calc non Af Amer 82 (*)    All other components within normal limits  RAPID STREP SCREEN  CULTURE, GROUP A STREP   Dg Chest 2 View  07/30/2012   *RADIOLOGY REPORT*  Clinical Data: Sore throat and weakness.  Sudden onset sore throat and jaw pain.  Current dental abscess in the right upper wisdom teeth.  CHEST - 2 VIEW  Comparison: 01/10/2011  Findings: Emphysematous changes and scattered fibrosis in the lungs. The heart size and pulmonary vascularity are normal. The lungs appear clear and expanded without focal air space disease or consolidation. No blunting of the costophrenic angles.  No pneumothorax.  Mediastinal contours appear intact.  Vague opacity over the right lower chest is consistent with soft tissue attenuation.  Mild degenerative changes in the spine.  Incidental note of widened sub acromial spaces in both shoulders.  This is likely to be due to positioning.  IMPRESSION: Emphysematous changes and scattered fibrosis in the lungs.  No evidence of active pulmonary  disease.   Original Report Authenticated By: Burman Nieves, M.D.   Ct Maxillofacial Wo Cm  07/30/2012   *RADIOLOGY REPORT*  Clinical Data: Sore throat.  Right-sided dental pain.  Weakness. The patient is scheduled for right upper wisdom teeth extraction tomorrow.  No IV contrast material given due to contrast allergy.  CT MAXILLOFACIAL WITHOUT CONTRAST  Technique:  Multidetector CT imaging of the maxillofacial structures was performed. Multiplanar CT image reconstructions were also generated.  Comparison: CT head 01/12/2011.  Findings: Multiple dental caries are demonstrated.  Peri apical lucency and bone destruction around the right upper third molar consistent with history of dental abscess.  Focal gas collection in the soft tissues superficial to the right maxilla anterior to the area of bone destruction may represent extension into the soft tissues.  No  discrete abscess collection is identified.  However, lack of contrast material limits evaluation of inflammatory changes.  Mucosal membrane thickening in the maxillary antra and ethmoid air cells consistent with inflammatory change.  No acute air-fluid levels in the paranasal sinuses.  No displaced orbital or facial fractures identified.  Globes and extraocular muscles appear intact and symmetrical.  IMPRESSION: Multiple dental caries.  Peri apical lucency and bone destruction at the right upper third molar consistent with periodontal abscess. Focal gas collection in the overlying soft tissues may represent extension in the soft tissues.   Original Report Authenticated By: Burman Nieves, M.D.   Results for orders placed during the hospital encounter of 07/30/12  RAPID STREP SCREEN      Result Value Range   Streptococcus, Group A Screen (Direct) NEGATIVE  NEGATIVE  CBC WITH DIFFERENTIAL      Result Value Range   WBC 9.4  4.0 - 10.5 K/uL   RBC 3.98  3.87 - 5.11 MIL/uL   Hemoglobin 13.4  12.0 - 15.0 g/dL   HCT 16.1  09.6 - 04.5 %   MCV 91.7  78.0 -  100.0 fL   MCH 33.7  26.0 - 34.0 pg   MCHC 36.7 (*) 30.0 - 36.0 g/dL   RDW 40.9  81.1 - 91.4 %   Platelets 197  150 - 400 K/uL   Neutrophils Relative % 80 (*) 43 - 77 %   Neutro Abs 7.5  1.7 - 7.7 K/uL   Lymphocytes Relative 12  12 - 46 %   Lymphs Abs 1.1  0.7 - 4.0 K/uL   Monocytes Relative 7  3 - 12 %   Monocytes Absolute 0.7  0.1 - 1.0 K/uL   Eosinophils Relative 1  0 - 5 %   Eosinophils Absolute 0.1  0.0 - 0.7 K/uL   Basophils Relative 0  0 - 1 %   Basophils Absolute 0.0  0.0 - 0.1 K/uL  BASIC METABOLIC PANEL      Result Value Range   Sodium 137  135 - 145 mEq/L   Potassium 3.5  3.5 - 5.1 mEq/L   Chloride 102  96 - 112 mEq/L   CO2 28  19 - 32 mEq/L   Glucose, Bld 93  70 - 99 mg/dL   BUN 10  6 - 23 mg/dL   Creatinine, Ser 7.82  0.50 - 1.10 mg/dL   Calcium 9.1  8.4 - 95.6 mg/dL   GFR calc non Af Amer 82 (*) >90 mL/min   GFR calc Af Amer >90  >90 mL/min       1. Tooth abscess   2. URI (upper respiratory infection)     MDM  Workup negative except for evidence of abscess around the right upper molar with soft tissue extension. No evidence of pneumonia. Rapid strep was negative however patient has been taking penicillin so is unlikely to be positive but she wasn't taking that. White count is normal no leukocytosis no sniffing electrolyte abnormalities. CT scan with some limitations due to the fact it can be done with IV dye due to her allergies. How will switch patient from penicillin to clindamycin she has followup with her regular doctor tomorrow. Also think that the other symptoms are a new onset upper respiratory infection and unrelated to the tooth.     I personally performed the services described in this documentation, which was scribed in my presence. The recorded information has been reviewed and is accurate.     Cristiana Yochim  Lilli Few, MD 07/30/12 2204

## 2012-07-30 NOTE — ED Notes (Signed)
Nausea and dry heaving in triage.

## 2012-08-01 LAB — CULTURE, GROUP A STREP

## 2012-10-09 ENCOUNTER — Encounter (HOSPITAL_COMMUNITY): Payer: Self-pay | Admitting: *Deleted

## 2012-10-09 ENCOUNTER — Emergency Department (HOSPITAL_COMMUNITY)
Admission: EM | Admit: 2012-10-09 | Discharge: 2012-10-09 | Disposition: A | Discharge status: Home / Self Care | Payer: PRIVATE HEALTH INSURANCE | Attending: Emergency Medicine | Admitting: Emergency Medicine

## 2012-10-09 DIAGNOSIS — S70261A Insect bite (nonvenomous), right hip, initial encounter: Secondary | ICD-10-CM

## 2012-10-09 DIAGNOSIS — Y9389 Activity, other specified: Secondary | ICD-10-CM | POA: Insufficient documentation

## 2012-10-09 DIAGNOSIS — Z792 Long term (current) use of antibiotics: Secondary | ICD-10-CM | POA: Insufficient documentation

## 2012-10-09 DIAGNOSIS — Z8742 Personal history of other diseases of the female genital tract: Secondary | ICD-10-CM | POA: Insufficient documentation

## 2012-10-09 DIAGNOSIS — S90569A Insect bite (nonvenomous), unspecified ankle, initial encounter: Secondary | ICD-10-CM | POA: Insufficient documentation

## 2012-10-09 DIAGNOSIS — Y9229 Other specified public building as the place of occurrence of the external cause: Secondary | ICD-10-CM | POA: Insufficient documentation

## 2012-10-09 DIAGNOSIS — F17200 Nicotine dependence, unspecified, uncomplicated: Secondary | ICD-10-CM | POA: Insufficient documentation

## 2012-10-09 DIAGNOSIS — Z79899 Other long term (current) drug therapy: Secondary | ICD-10-CM | POA: Insufficient documentation

## 2012-10-09 DIAGNOSIS — Z91040 Latex allergy status: Secondary | ICD-10-CM | POA: Insufficient documentation

## 2012-10-09 DIAGNOSIS — Y990 Civilian activity done for income or pay: Secondary | ICD-10-CM | POA: Insufficient documentation

## 2012-10-09 MED ORDER — DIPHENHYDRAMINE HCL 25 MG PO CAPS: 25.0000 mg | ORAL_CAPSULE | Freq: Once | ORAL | Status: DC

## 2012-10-09 MED ORDER — FAMOTIDINE 20 MG PO TABS
40.0000 mg | ORAL_TABLET | Freq: Once | ORAL | Status: AC
  Administered 2012-10-09: 40 mg via ORAL
  Filled 2012-10-09: qty 2

## 2012-10-09 MED ORDER — FAMOTIDINE 40 MG PO TABS: 40.0000 mg | ORAL_TABLET | Freq: Two times a day (BID) | ORAL | Status: DC | PRN

## 2012-10-09 NOTE — ED Notes (Signed)
Insect bite to rt hip at work in [facility]

## 2012-10-09 NOTE — ED Notes (Signed)
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  Today at 2000, pt was working in a resident's room and felt a bug bite or sting of some sort.  She later noticed it became a raised whelp and the redness around the area had increased.  Her supervisor advised her to seek care.  She is currently itching on her chest, trunk, hip, and her face.

## 2012-10-10 NOTE — ED Provider Notes (Signed)
CSN: [id number]     Arrival date & time 10/09/12  2041 History   First MD Initiated Contact with Patient 10/09/12 2132     No chief complaint on file.  (Consider location/radiation/quality/duration/timing/severity/associated sxs/prior Treatment) HPI Comments: Lisa Fields is a 32 y.o. Female presenting with a suspected insect bite to her right lateral thigh about an hour before arrival tonight. 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  She did not see the insect that bit her.  She did not take any medicines prior to arrival.  She denies cough, shortness of breath, face, mouth or throat swelling.  She reports a history of hives in the past from unknown exposure and states she feels this will progress to hives as the sensation is similar.     The history is provided by the patient.    Past Medical History  Diagnosis Date  . Ovarian cyst    Past Surgical History  Procedure Laterality Date  . Tubal ligation    . Appendectomy    . Ovarian cyst surgery     History reviewed. No pertinent family history. History  Substance Use Topics  . Smoking status: Current Every Day Smoker -- 0.50 packs/day    Types: Cigarettes  . Smokeless tobacco: Not on file  . Alcohol Use: No   OB History   Grav Para Term Preterm Abortions TAB SAB Ect Mult Living                 Review of Systems  Constitutional: Negative for fever and chills.  HENT: Negative for facial swelling.   Respiratory: Negative for shortness of breath and wheezing.   Skin: Positive for color change and wound.  Neurological: Negative for numbness.    Allergies  Ivp dye; Sulfa antibiotics; Codeine; Latex; Morphine and related; and Vicodin  Home Medications   Current Outpatient Rx  Name  Route  Sig  Dispense  Refill   . clindamycin (CLEOCIN) 300 MG capsule   Oral   Take 1 capsule (300 mg total) by mouth 3 (three) times daily.   30 capsule   0   . famotidine (PEPCID) 40 MG tablet   Oral   Take 1 tablet (40 mg total) by mouth 2 (two) times daily as needed.   15 tablet   0   . ibuprofen (ADVIL,MOTRIN) 800 MG tablet   Oral   Take 800 mg by mouth every 8 (eight) hours as needed. For headaches          . penicillin v potassium (VEETID) 500 MG tablet   Oral   Take 500 mg by mouth 4 (four) times daily. (Started on 07/22/2012)         . Phenylephrine-Ibuprofen (ADVIL CONGESTION RELIEF PO)   Oral   Take 2 tablets by mouth every 6 (six) hours as needed. pain          BP 108/65  Pulse 81  Temp(Src) 98.1 F (36.7 C) (Oral)  Resp 20  Ht 5\' 9"  (1.753 m)  Wt 142 lb (64.411 kg)  BMI 20.96 kg/m2  SpO2 97%  LMP 08/08/2012 Physical Exam  Constitutional: She appears well-developed and well-nourished. No distress.  HENT:  Head: Normocephalic.  Mouth/Throat: Oropharynx is clear and moist.  Neck: Neck supple.  Cardiovascular:  Normal rate.   Pulmonary/Chest: Effort normal. She has no wheezes.  Musculoskeletal: Normal range of motion. She exhibits no edema.  Skin: There is erythema.  2 cm diameter round raised papule right lateral thigh with 3 smaller raised papules within the larger area,  Erythema without induration.  No red streaking.  Patient actively scratching abdomen and neck.  No rash or hives noted.    ED Course  Procedures (including critical care time) Labs Review Labs Reviewed - No data to display Imaging Review No results found.  MDM   1. Insect bite of hip with local reaction, right, initial encounter    Pt was offered benadryl but deferred as she wants to complete her shift and this will make her too drowsy.  Will take benadryl once she is home.  She was given pepcid and first dose of prednisone,  Prescriptions for same given.  Encouraged ice pack to site.    Exam and  history c/w insect bite.  No respiratory distress, no signs of anaphylactic reaction.  Encouraged recheck/return here for any worsened sx.     Burgess Amor, PA-C 10/10/12 [phone]

## 2012-10-10 NOTE — ED Provider Notes (Signed)
Medical screening examination/treatment/procedure(s) were performed by non-physician practitioner and as supervising physician I was immediately available for consultation/collaboration.  Donnetta Hutching, MD 10/10/12 2227

## 2013-02-22 ENCOUNTER — Encounter: Payer: Self-pay | Admitting: Family Medicine

## 2013-02-22 ENCOUNTER — Ambulatory Visit (INDEPENDENT_AMBULATORY_CARE_PROVIDER_SITE_OTHER): Payer: 59 | Admitting: Family Medicine

## 2013-02-22 VITALS — Ht 69.0 in | Wt 143.0 lb

## 2013-02-22 DIAGNOSIS — J111 Influenza due to unidentified influenza virus with other respiratory manifestations: Secondary | ICD-10-CM

## 2013-02-22 MED ORDER — ONDANSETRON HCL 8 MG PO TABS: 8.0000 mg | ORAL_TABLET | Freq: Three times a day (TID) | ORAL | Status: DC | PRN

## 2013-02-22 MED ORDER — AZITHROMYCIN 250 MG PO TABS: ORAL_TABLET | ORAL | Status: DC

## 2013-02-22 NOTE — Patient Instructions (Signed)

## 2013-02-22 NOTE — Progress Notes (Signed)
   Subjective:    Patient ID: Lisa Fields, female    DOB: [birth year], 33 y.o.   MRN: [id number]  Cough This is a new problem. The current episode started in the past 7 days. Associated symptoms include a fever, headaches, myalgias and nasal congestion.  severe fatigue Patient relates sinus pressure pain discomfort. She relates coughing discomfort started off few days ago progressively worse now fairly stable but severe fatigue and sinus pain PMH benign She does smoke.   Review of Systems  Constitutional: Positive for fever.  Respiratory: Positive for cough.   Musculoskeletal: Positive for myalgias.  Neurological: Positive for headaches.       Objective:   Physical Exam  Nursing note and vitals reviewed. Constitutional: She appears well-developed.  HENT:  Head: Normocephalic.  Nose: Nose normal.  Mouth/Throat: Oropharynx is clear and moist. No oropharyngeal exudate.  Sinus pain on percussion  Neck: Neck supple.  Cardiovascular: Normal rate and normal heart sounds.   No murmur heard. Pulmonary/Chest: Effort normal and breath sounds normal. She has no wheezes.  Lymphadenopathy:    She has no cervical adenopathy.  Skin: Skin is warm and dry.          Assessment & Plan:  Influenza-the patient was diagnosed with influenza. Patient/family educated about the flu and warning signs to watch for. If difficulty breathing, severe neck pain and stiffness, cyanosis, disorientation, or progressive worsening then immediately get rechecked at that ER. If progressive symptoms be certain to be rechecked. Supportive measures such as Tylenol/ibuprofen was discussed. No aspirin use in children. And influenza home care instruction sheet was given.  Secondary bronchitis-there may even be mild element of sinusitis Z-Pak prescribed followup if ongoing trouble  Patient is to try to quit smoking  Off work the rest of this week through the weekend. There is a possibility she may not be able  to go back to work on Monday. Patient has been out from this past Sunday all the way through next Sunday.

## 2013-02-26 DIAGNOSIS — Z0289 Encounter for other administrative examinations: Secondary | ICD-10-CM

## 2013-03-06 ENCOUNTER — Telehealth: Payer: Self-pay | Admitting: Family Medicine

## 2013-03-06 ENCOUNTER — Encounter: Payer: Self-pay | Admitting: Family Medicine

## 2013-03-06 ENCOUNTER — Ambulatory Visit (INDEPENDENT_AMBULATORY_CARE_PROVIDER_SITE_OTHER): Payer: 59 | Admitting: Family Medicine

## 2013-03-06 VITALS — BP 120/80 | Ht 69.0 in | Wt 142.0 lb

## 2013-03-06 DIAGNOSIS — R51 Headache: Secondary | ICD-10-CM

## 2013-03-06 DIAGNOSIS — R519 Headache, unspecified: Secondary | ICD-10-CM

## 2013-03-06 MED ORDER — CITALOPRAM HYDROBROMIDE 10 MG PO TABS: 10.0000 mg | ORAL_TABLET | Freq: Every day | ORAL | Status: DC

## 2013-03-06 MED ORDER — TOPIRAMATE 25 MG PO TABS
25.0000 mg | ORAL_TABLET | Freq: Two times a day (BID) | ORAL | Status: DC
Start: 2013-03-06 — End: 2013-03-16

## 2013-03-06 NOTE — Progress Notes (Signed)
   Subjective:    Patient ID: Lisa Fields, female    DOB: Aug 13, 1980, 33 y.o.   MRN: [id number]  HPI Comments: Also having moles? Pop up on her scalp and chest. Noticed them about 3 months ago.   Migraine  This is a new problem. The current episode started 1 to 4 weeks ago. The problem occurs daily. The problem has been gradually worsening. The pain is located in the bilateral, occipital, temporal and frontal region. The pain does not radiate. The pain quality is not similar to prior headaches. The quality of the pain is described as throbbing. The pain is moderate. Associated symptoms include blurred vision, dizziness and photophobia. The symptoms are aggravated by bright light, noise, emotional stress and work. Treatments tried: Excedrine Migraine. The treatment provided no relief.   Significant amount of time was spent discussing stress issues both at home that she's having with the custody issue with her child who is in the grandmother's care currently and she hopes to get back under her care. Also discussed the overuse of analgesic medicines and how this can cause rebound headaches.   Review of Systems  Eyes: Positive for blurred vision and photophobia.  Neurological: Positive for dizziness.   headaches do not wake her up in the middle night there is no fevers associated with this.     Objective:   Physical Exam Neck is supple eardrums normal throat is normal lungs are clear hearts regular       Assessment & Plan:  #1 overwhelming stress some elements of depression use low dose Celexa 10 mg daily followup patient in 4 weeks have advised patient to consider counseling more than likely she states her just talk with her sister she denies being suicidal  #2 chronic headaches-not helped by the use of Excedrin Migraine. This is causing rebound headaches she needs to stop using this. Also Topamax 25 mg 1 daily for 7 days then one twice a day. She is to followup in 4 weeks. I do not  feel this patient needs any scanning.

## 2013-03-06 NOTE — Telephone Encounter (Signed)
Patient states that [facility] in [place] did not receive Rx for Celexa

## 2013-03-06 NOTE — Telephone Encounter (Signed)
Sent [facility] to [facility] [place] through [facility] again. Not able to contact patient because home number in Epic is disconnected.

## 2013-03-16 ENCOUNTER — Ambulatory Visit (INDEPENDENT_AMBULATORY_CARE_PROVIDER_SITE_OTHER): Payer: 59 | Admitting: Family Medicine

## 2013-03-16 ENCOUNTER — Encounter: Payer: Self-pay | Admitting: Family Medicine

## 2013-03-16 VITALS — BP 108/70 | Temp 99.4°F | Ht 69.0 in | Wt 141.6 lb

## 2013-03-16 DIAGNOSIS — J310 Chronic rhinitis: Secondary | ICD-10-CM

## 2013-03-16 DIAGNOSIS — J329 Chronic sinusitis, unspecified: Secondary | ICD-10-CM

## 2013-03-16 MED ORDER — BENZONATATE 100 MG PO CAPS: 100.0000 mg | ORAL_CAPSULE | Freq: Three times a day (TID) | ORAL | Status: DC | PRN

## 2013-03-16 MED ORDER — CEFDINIR 300 MG PO CAPS: 300.0000 mg | ORAL_CAPSULE | Freq: Two times a day (BID) | ORAL | Status: DC

## 2013-03-16 NOTE — Progress Notes (Signed)
   Subjective:    Patient ID: Lisa Fields, female    DOB: 06-Nov-1980, 33 y.o.   MRN: [id number]  Fever  This is a new problem. The current episode started in the past 7 days. The maximum temperature noted was 102 to 102.9 F. Associated symptoms include coughing and muscle aches. Associated symptoms comments: Runny nose. She has tried acetaminophen for the symptoms.    tmax 102, no sig headache  Chills  otc took [facility] robitussin and nyquil  Notes a significant sore throat.  Productive of yellowish phlegm.  Review of Systems  Constitutional: Positive for fever.  Respiratory: Positive for cough.        Objective:   Physical Exam Alert moderate malaise. Temp 99.4. Vital stable. H&T moderate his congestion frontal tenderness. Neck supple. Lungs clear. Heart regular in rhythm.       Assessment & Plan:  Impression post viral rhinosinusitis/bronchitis plan Omnicef twice a day 10 days. Symptomatic care discussed. Warning signs discussed. WSL

## 2013-03-30 ENCOUNTER — Ambulatory Visit (INDEPENDENT_AMBULATORY_CARE_PROVIDER_SITE_OTHER): Payer: 59 | Admitting: Family Medicine

## 2013-03-30 ENCOUNTER — Encounter: Payer: Self-pay | Admitting: Family Medicine

## 2013-03-30 VITALS — BP 104/68 | Ht 69.0 in | Wt 141.0 lb

## 2013-03-30 DIAGNOSIS — R519 Headache, unspecified: Secondary | ICD-10-CM

## 2013-03-30 DIAGNOSIS — D492 Neoplasm of unspecified behavior of bone, soft tissue, and skin: Secondary | ICD-10-CM

## 2013-03-30 DIAGNOSIS — R51 Headache: Secondary | ICD-10-CM

## 2013-03-30 MED ORDER — MELATONIN 3 MG PO TABS: 1.0000 | ORAL_TABLET | Freq: Every evening | ORAL | Status: DC | PRN

## 2013-03-30 NOTE — Progress Notes (Signed)
   Subjective:    Patient ID: WEALTHY DANIELSKI, female    DOB: 08-06-1980, 33 y.o.   MRN: [id number]  HPIFollow up on headaches and celexa. Patient states celexa is working good for her.   Headaches. Patient had to stop taking topamax. It made her feet and legs tingle and stomach cramp. Also she was walking into walls when taking. States she felt drunk on the med. Headaches are doing a lot better. She thinks they were stressed induced.   Patient wants to know if it is safe to take melatonin with celexa.   Patient having diarrhea about 3 times every am for the past week. Also having nausea.  She feels this is due to recent antibiotic. She denies blood or mucus in it.     Review of Systems Patient denies any severe headaches no nausea vomiting or diarrhea energy level overall doing pretty good.    Objective:   Physical Exam Lungs clear heart regular       Assessment & Plan:  Patient's headaches are much better probably stress related. No further medications.  Patient moods are doing well on Celexa she is to continue this measures followup in 4 months at that time if things are going well will consider tapering off.  It is okay for her to take melatonin with this  If she starts having bloody mucous or other problems she needs to immediately let us know. Doubt C. difficile currently.  She does have benign growths in her scalp that seemed to be getting worse they catch on her [facility] often bleed I recommend referral to dermatology for removal

## 2013-04-10 ENCOUNTER — Encounter: Payer: Self-pay | Admitting: Family Medicine

## 2013-07-05 ENCOUNTER — Other Ambulatory Visit: Payer: Self-pay | Admitting: Family Medicine

## 2013-07-05 DIAGNOSIS — Z1231 Encounter for screening mammogram for malignant neoplasm of breast: Secondary | ICD-10-CM

## 2013-07-09 ENCOUNTER — Other Ambulatory Visit: Payer: Self-pay | Admitting: Family Medicine

## 2013-07-09 ENCOUNTER — Ambulatory Visit (HOSPITAL_COMMUNITY)
Admission: RE | Admit: 2013-07-09 | Discharge: 2013-07-09 | Disposition: A | Discharge status: Home / Self Care | Payer: 59 | Source: Ambulatory Visit | Attending: Family Medicine | Admitting: Family Medicine

## 2013-07-09 DIAGNOSIS — N6320 Unspecified lump in the left breast, unspecified quadrant: Secondary | ICD-10-CM

## 2013-07-09 DIAGNOSIS — Z1231 Encounter for screening mammogram for malignant neoplasm of breast: Secondary | ICD-10-CM

## 2013-07-18 ENCOUNTER — Encounter: Payer: Self-pay | Admitting: Family Medicine

## 2013-07-18 ENCOUNTER — Ambulatory Visit (INDEPENDENT_AMBULATORY_CARE_PROVIDER_SITE_OTHER): Payer: 59 | Admitting: Family Medicine

## 2013-07-18 VITALS — BP 120/82 | Temp 98.2°F | Ht 69.0 in | Wt 140.4 lb

## 2013-07-18 DIAGNOSIS — J329 Chronic sinusitis, unspecified: Secondary | ICD-10-CM

## 2013-07-18 DIAGNOSIS — H65191 Other acute nonsuppurative otitis media, right ear: Secondary | ICD-10-CM

## 2013-07-18 DIAGNOSIS — H65199 Other acute nonsuppurative otitis media, unspecified ear: Secondary | ICD-10-CM

## 2013-07-18 MED ORDER — AMOXICILLIN-POT CLAVULANATE 875-125 MG PO TABS: 1.0000 | ORAL_TABLET | Freq: Two times a day (BID) | ORAL | Status: DC

## 2013-07-18 NOTE — Progress Notes (Signed)
   Subjective:    Patient ID: Lisa Fields, female    DOB: [birth year], 33 y.o.   MRN: [id number]  Sinusitis This is a new problem. The current episode started in the past 7 days. The problem has been gradually worsening since onset. Maximum temperature: 99.6. The pain is moderate. Associated symptoms include congestion, coughing, ear pain and headaches. (Body aches) Past treatments include acetaminophen and oral decongestants. The treatment provided no relief.   Patient states that she wants to discuss smoking cessation also.    Remote asthma  heADACHE FRONTAL    Review of Systems  HENT: Positive for congestion and ear pain.   Respiratory: Positive for cough.   Neurological: Positive for headaches.       Objective:   Physical Exam  Alert moderate malaise good hydration left TM normal right TM inflamed erythematous neck supple. Lungs clear heart regular in rhythm.      Assessment & Plan:  Impression right otitis media with rhinosinusitis plan encouraged to stop smoking. Augmentin twice a day 10 days. Symptomatic care discussed. WSL

## 2013-07-24 ENCOUNTER — Emergency Department (HOSPITAL_COMMUNITY)
Admission: EM | Admit: 2013-07-24 | Discharge: 2013-07-24 | Disposition: A | Discharge status: Home / Self Care | Payer: 59 | Attending: Emergency Medicine | Admitting: Emergency Medicine

## 2013-07-24 ENCOUNTER — Encounter (HOSPITAL_COMMUNITY): Payer: Self-pay | Admitting: Emergency Medicine

## 2013-07-24 ENCOUNTER — Emergency Department (HOSPITAL_COMMUNITY): Payer: 59

## 2013-07-24 ENCOUNTER — Telehealth: Payer: Self-pay | Admitting: Family Medicine

## 2013-07-24 DIAGNOSIS — Z79899 Other long term (current) drug therapy: Secondary | ICD-10-CM | POA: Insufficient documentation

## 2013-07-24 DIAGNOSIS — R05 Cough: Secondary | ICD-10-CM

## 2013-07-24 DIAGNOSIS — F17200 Nicotine dependence, unspecified, uncomplicated: Secondary | ICD-10-CM | POA: Insufficient documentation

## 2013-07-24 DIAGNOSIS — B9789 Other viral agents as the cause of diseases classified elsewhere: Secondary | ICD-10-CM | POA: Insufficient documentation

## 2013-07-24 DIAGNOSIS — Z91040 Latex allergy status: Secondary | ICD-10-CM | POA: Insufficient documentation

## 2013-07-24 DIAGNOSIS — R059 Cough, unspecified: Secondary | ICD-10-CM

## 2013-07-24 DIAGNOSIS — Z8709 Personal history of other diseases of the respiratory system: Secondary | ICD-10-CM | POA: Insufficient documentation

## 2013-07-24 DIAGNOSIS — B349 Viral infection, unspecified: Secondary | ICD-10-CM

## 2013-07-24 DIAGNOSIS — Z3202 Encounter for pregnancy test, result negative: Secondary | ICD-10-CM | POA: Insufficient documentation

## 2013-07-24 DIAGNOSIS — G8929 Other chronic pain: Secondary | ICD-10-CM | POA: Insufficient documentation

## 2013-07-24 DIAGNOSIS — Z792 Long term (current) use of antibiotics: Secondary | ICD-10-CM | POA: Insufficient documentation

## 2013-07-24 DIAGNOSIS — Z8742 Personal history of other diseases of the female genital tract: Secondary | ICD-10-CM | POA: Insufficient documentation

## 2013-07-24 HISTORY — DX: Other chronic pain: G89.29

## 2013-07-24 HISTORY — DX: Bronchitis, not specified as acute or chronic: J40

## 2013-07-24 HISTORY — DX: Headache, unspecified: R51.9

## 2013-07-24 HISTORY — DX: Headache: R51

## 2013-07-24 LAB — URINALYSIS, ROUTINE W REFLEX MICROSCOPIC
Glucose, UA: NEGATIVE mg/dL
KETONES UR: 15 mg/dL — AB
Leukocytes, UA: NEGATIVE
NITRITE: NEGATIVE
PROTEIN: NEGATIVE mg/dL
Specific Gravity, Urine: >1.03 — ABNORMAL HIGH (ref 1.005–1.030)
UROBILINOGEN UA: 0.2 mg/dL (ref 0.0–1.0)
pH: 5.5 (ref 5.0–8.0)

## 2013-07-24 LAB — I-STAT CHEM 8, ED
BUN: 12 mg/dL (ref 6–23)
CREATININE: 0.8 mg/dL (ref 0.50–1.10)
Calcium, Ion: 1.1 mmol/L — ABNORMAL LOW (ref 1.12–1.23)
Chloride: 101 mEq/L (ref 96–112)
Glucose, Bld: 89 mg/dL (ref 70–99)
HEMATOCRIT: 41 % (ref 36.0–46.0)
HEMOGLOBIN: 13.9 g/dL (ref 12.0–15.0)
POTASSIUM: 3.5 meq/L — AB (ref 3.7–5.3)
SODIUM: 136 meq/L — AB (ref 137–147)
TCO2: 23 mmol/L (ref 0–100)

## 2013-07-24 LAB — I-STAT TROPONIN, ED: TROPONIN I, POC: 0 ng/mL (ref 0.00–0.08)

## 2013-07-24 LAB — D-DIMER, QUANTITATIVE (NOT AT ARMC)

## 2013-07-24 LAB — URINE MICROSCOPIC-ADD ON

## 2013-07-24 LAB — PREGNANCY, URINE: PREG TEST UR: NEGATIVE

## 2013-07-24 MED ORDER — ACETAMINOPHEN 325 MG PO TABS
650.0000 mg | ORAL_TABLET | Freq: Once | ORAL | Status: AC
  Administered 2013-07-24: 650 mg via ORAL
  Filled 2013-07-24: qty 2

## 2013-07-24 MED ORDER — ALBUTEROL SULFATE HFA 108 (90 BASE) MCG/ACT IN AERS: 2.0000 | INHALATION_SPRAY | RESPIRATORY_TRACT | Status: DC | PRN

## 2013-07-24 MED ORDER — SODIUM CHLORIDE 0.9 % IV BOLUS (SEPSIS)
1000.0000 mL | Freq: Once | INTRAVENOUS | Status: AC
  Administered 2013-07-24: 1000 mL via INTRAVENOUS

## 2013-07-24 MED ORDER — ALBUTEROL SULFATE (2.5 MG/3ML) 0.083% IN NEBU
2.5000 mg | INHALATION_SOLUTION | Freq: Once | RESPIRATORY_TRACT | Status: AC
  Administered 2013-07-24: 2.5 mg via RESPIRATORY_TRACT
  Filled 2013-07-24: qty 3

## 2013-07-24 MED ORDER — BENZONATATE 100 MG PO CAPS: 100.0000 mg | ORAL_CAPSULE | Freq: Three times a day (TID) | ORAL | Status: DC | PRN

## 2013-07-24 MED ORDER — SODIUM CHLORIDE 0.9 % IV SOLN
INTRAVENOUS | Status: DC
  Administered 2013-07-24: 17:00:00 via INTRAVENOUS

## 2013-07-24 MED ORDER — IPRATROPIUM-ALBUTEROL 0.5-2.5 (3) MG/3ML IN SOLN
3.0000 mL | Freq: Once | RESPIRATORY_TRACT | Status: AC
  Administered 2013-07-24: 3 mL via RESPIRATORY_TRACT
  Filled 2013-07-24: qty 3

## 2013-07-24 NOTE — ED Notes (Signed)
Pt dxwith Bronchitis, 6/24. On AB therapy. Pain with expiration, dizziness with walking/standing.

## 2013-07-24 NOTE — ED Provider Notes (Signed)
CSN: [id number]     Arrival date & time 07/24/13  1156 History   First MD Initiated Contact with Patient 07/24/13 1337     Chief Complaint  Patient presents with  . Chest Pain  . Cough  . Fatigue      HPI Pt was seen at 1335.   Per pt, c/o gradual onset and persistence of constant runny/stuffy nose, sinus congestion, and cough for the past 1 week, worse over the past 2-3 days. Has been associated with generalized body aches/fatigue, "lightheadedness" when moving positions from laying to standing, and generalized chest "pain." CP worsens with deep breath, palpation of the area, and body position changes. Pt was evaluated by her PMD 6 days ago for same, dx "bronchitis," and rx augmentin. Pt states her symptoms have not improved. Pt continues to smoke cigarettes daily. Pt's family states "the rest of the family had this already" over the past few weeks. Denies fevers, no rash, no palpitations, no SOB, no N/V/D, no abd pain.     Past Medical History  Diagnosis Date  . Ovarian cyst   . Bronchitis   . Chronic headaches    Past Surgical History  Procedure Laterality Date  . Tubal ligation    . Appendectomy    . Ovarian cyst surgery      History  Substance Use Topics  . Smoking status: Current Every Day Smoker -- 0.50 packs/day    Types: Cigarettes  . Smokeless tobacco: Not on file  . Alcohol Use: No    Review of Systems ROS: Statement: All systems negative except as marked or noted in the HPI; Constitutional: +generalized body aches/fatigue, lightheadedness. Negative for fever and chills. ; ; Eyes: Negative for eye pain, redness and discharge. ; ; ENMT: Negative for ear pain, hoarseness, sore throat. +nasal congestion, rhinorrhea, sinus pressure. ; ; Cardiovascular: Negative for chest pain, palpitations, diaphoresis, dyspnea and peripheral edema. ; ; Respiratory: +cough. Negative for wheezing and stridor. ; ; Gastrointestinal: Negative for nausea, vomiting, diarrhea, abdominal pain,  blood in stool, hematemesis, jaundice and rectal bleeding. . ; ; Genitourinary: Negative for dysuria, flank pain and hematuria. ; ; Musculoskeletal: Negative for back pain and neck pain. Negative for swelling and trauma.; ; Skin: Negative for pruritus, rash, abrasions, blisters, bruising and skin lesion.; ; Neuro: Negative for headache and neck stiffness. Negative for altered level of consciousness , altered mental status, extremity weakness, paresthesias, involuntary movement, seizure and syncope.      Allergies  Ivp dye; Sulfa antibiotics; Codeine; Latex; Morphine and related; Topamax; and Vicodin  Home Medications   Prior to Admission medications   Medication Sig Start Date End Date Taking? Authorizing Provider  amoxicillin-clavulanate (AUGMENTIN) 875-125 MG per tablet Take 1 tablet by mouth 2 (two) times daily. 07/18/13 08/01/13 Yes Mikey Kirschner, MD  citalopram (CELEXA) 10 MG tablet Take 10 mg by mouth daily.   Yes Historical Provider, MD  ibuprofen (ADVIL,MOTRIN) 800 MG tablet Take 800 mg by mouth every 8 (eight) hours as needed. For headaches    Yes Historical Provider, MD  Melatonin 3 MG TABS Take 1 tablet (3 mg total) by mouth at bedtime as needed. 03/30/13   Kathyrn Drown, MD   BP 102/68  Pulse 63  Temp(Src) 98.7 F (37.1 C) (Oral)  Resp 22  Ht 5\' 9"  (1.753 m)  Wt 145 lb (65.772 kg)  BMI 21.40 kg/m2  SpO2 100%  LMP 07/24/2013 Physical Exam 1340: Physical examination:  Nursing notes reviewed; Vital signs and  O2 SAT reviewed;  Constitutional: Well developed, Well nourished, Well hydrated, In no acute distress; Head:  Normocephalic, atraumatic; Eyes: EOMI, PERRL, No scleral icterus; ENMT: Mouth and pharynx normal, Mucous membranes moist; Neck: Supple, Full range of motion, No meningeal signs. No lymphadenopathy; Cardiovascular: Regular rate and rhythm, No gallop; Respiratory: Breath sounds coarse & equal bilaterally, No wheezes.  Speaking full sentences with ease, Normal respiratory  effort/excursion; Chest: +bilateral parasternal and anterior chest wall tenderness to palp. No rash, no soft tissue crepitus, no deformity. Movement normal; Abdomen: Soft, Nontender, Nondistended, Normal bowel sounds; Genitourinary: No CVA tenderness; Extremities: Pulses normal, No tenderness, No edema, No calf edema or asymmetry.; Neuro: AA&Ox3, Major CN grossly intact.  Speech clear. No gross focal motor or sensory deficits in extremities. Climbs on and off stretcher easily by herself. Gait steady.; Skin: Color normal, Warm, Dry.    ED Course  Procedures      EKG Interpretation None      MDM  MDM Reviewed: previous chart, nursing note and vitals Reviewed previous: labs Interpretation: labs and x-ray    Date: 07/24/2013 on arrival  Rate: 81  Rhythm: normal sinus rhythm  QRS Axis: normal  Intervals: normal  ST/T Wave abnormalities: normal  Conduction Disutrbances:none  Narrative Interpretation:   Old EKG Reviewed: none available.   Date: 07/24/2013 repeat  Rate: 72  Rhythm: normal sinus rhythm  QRS Axis: normal  Intervals: normal  ST/T Wave abnormalities: normal  Conduction Disutrbances:none  Narrative Interpretation:   Old EKG Reviewed: unchanged from previous EKG today.    Results for orders placed during the hospital encounter of 07/24/13  D-DIMER, QUANTITATIVE      Result Value Ref Range   D-Dimer, Quant <0.27  0.00 - 0.48 ug/mL-FEU  PREGNANCY, URINE      Result Value Ref Range   Preg Test, Ur NEGATIVE  NEGATIVE  URINALYSIS, ROUTINE W REFLEX MICROSCOPIC      Result Value Ref Range   Color, Urine YELLOW  YELLOW   APPearance CLEAR  CLEAR   Specific Gravity, Urine >1.030 (*) 1.005 - 1.030   pH 5.5  5.0 - 8.0   Glucose, UA NEGATIVE  NEGATIVE mg/dL   Hgb urine dipstick MODERATE (*) NEGATIVE   Bilirubin Urine SMALL (*) NEGATIVE   Ketones, ur 15 (*) NEGATIVE mg/dL   Protein, ur NEGATIVE  NEGATIVE mg/dL   Urobilinogen, UA 0.2  0.0 - 1.0 mg/dL   Nitrite NEGATIVE   NEGATIVE   Leukocytes, UA NEGATIVE  NEGATIVE  URINE MICROSCOPIC-ADD ON      Result Value Ref Range   Squamous Epithelial / LPF RARE  RARE   WBC, UA 0-2  <3 WBC/hpf   RBC / HPF 3-6  <3 RBC/hpf   Bacteria, UA FEW (*) RARE  I-STAT CHEM 8, ED      Result Value Ref Range   Sodium 136 (*) 137 - 147 mEq/L   Potassium 3.5 (*) 3.7 - 5.3 mEq/L   Chloride 101  96 - 112 mEq/L   BUN 12  6 - 23 mg/dL   Creatinine, Ser 0.80  0.50 - 1.10 mg/dL   Glucose, Bld 89  70 - 99 mg/dL   Calcium, Ion 1.10 (*) 1.12 - 1.23 mmol/L   TCO2 23  0 - 100 mmol/L   Hemoglobin 13.9  12.0 - 15.0 g/dL   HCT 41.0  36.0 - 46.0 %  I-STAT TROPOININ, ED      Result Value Ref Range   Troponin i, poc 0.00  0.00 - 0.08 ng/mL   Comment 3            Dg Chest 2 View 07/24/2013   CLINICAL DATA:  Chest pain  EXAM: CHEST  2 VIEW  COMPARISON:  Prior radiograph from 07/30/2012  FINDINGS: The cardiac and mediastinal silhouettes are stable in size and contour, and remain within normal limits.  The lungs are normally inflated. No airspace consolidation, pleural effusion, or pulmonary edema is identified. There is no pneumothorax.  No acute osseous abnormality identified.  IMPRESSION: No active cardiopulmonary disease.   Electronically Signed   By: Jeannine Boga M.D.   On: 07/24/2013 14:24    1700:  Workup reassuring. VS per pt's baseline per EPIC chart review. Pt has tol PO well without N/V. Will tx symptomatically at this time. Pt states she wants to go home now. Dx and testing d/w pt and family.  Questions answered.  Verb understanding, agreeable to d/c home with outpt f/u.     Alfonzo Feller, DO 07/26/13 1625

## 2013-07-24 NOTE — ED Notes (Signed)
Pt c/o cough that started about two weeks ago, was seen on 07/18/2013, diagnosed with bronchitis by pcp, has been taking ;augmenton, chest pain started this am, pain is worse with movement and breathing, has been trying to cut down on smoking due to diagnosis of bronchitis,

## 2013-07-24 NOTE — Discharge Instructions (Signed)
°Emergency Department Resource Guide °1) Find a Doctor and Pay Out of Pocket °Although you won't have to find out who is covered by your insurance plan, it is a good idea to ask around and get recommendations. You will then need to call the office and see if the doctor you have chosen will accept you as a new patient and what types of options they offer for patients who are self-pay. Some doctors offer discounts or will set up payment plans for their patients who do not have insurance, but you will need to ask so you aren't surprised when you get to your appointment. ° °2) Contact Your Local Health Department °Not all health departments have doctors that can see patients for sick visits, but many do, so it is worth a call to see if yours does. If you don't know where your local health department is, you can check in your phone book. The CDC also has a tool to help you locate your state's health department, and many state websites also have listings of all of their local health departments. ° °3) Find a Walk-in Clinic °If your illness is not likely to be very severe or complicated, you may want to try a walk in clinic. These are popping up all over the country in pharmacies, drugstores, and shopping centers. They're usually staffed by nurse practitioners or physician assistants that have been trained to treat common illnesses and complaints. They're usually fairly quick and inexpensive. However, if you have serious medical issues or chronic medical problems, these are probably not your best option. ° °No Primary Care Doctor: °- Call [facility] at  832-8000 - they can help you locate a primary care doctor that  accepts your insurance, provides certain services, etc. °- Physician Referral Service- 1-800-533-3463 ° °Chronic Pain Problems: °Organization         Address  Phone   Notes  °[facility]  ([phone]) [phone] Patients need to be referred by their primary care doctor.  ° °Medication  Assistance: °Organization         Address  Phone   Notes  °[place] [facility] [address] °[place], [place] [address] (336) 641-8030 --Must be a resident of [place] °-- Must have NO insurance coverage whatsoever (no Medicaid/ Medicare, etc.) °-- The pt. MUST have a primary care doctor that directs their care regularly and follows them in the community °  °[facility]  (866) 331-1348   °[employer]  (888) 892-1162   ° °Agencies that provide inexpensive medical care: °Organization         Address  Phone   Notes  °[facility]  (336) 832-8035   °[facility]    (336) 832-7272   °[facility] [address] °[place], [place] [address] (336) 832-4777   °[facility] [address], °[place] (336) 271-4999   °[facility]    (336) 373-0678   °[facility]    (336) 272-1050   °[facility] ° [address], [place] Phone:  (336) 832-4444, Fax:  (336) 832-4440 Hours of Operation:  9 am - 6 pm, M-F.  Also accepts Medicaid/Medicare and self-pay.  °[facility] ° [address], [place] Phone: (336) [phone], Fax: (336) [phone]. Hours of Operation:  8:30 am - 5:30 pm, M-F.  Also accepts Medicaid and self-pay.  °[facility] [place] [address], [place] Phone: (336) 878-6027   °[facility] [address], [place], [place] (336)723-1848, Ext. 123 Mondays & Thursdays: 7-9 AM.  First 15 patients are seen on a first come, first serve basis. °  ° °Medicaid-accepting [place] Providers: ° °Organization         Address  Phone   Notes  °[facility] [address], [place] (336) 641-2100 Also accepts self-pay patients.  °[facility] [address], [place] ° (336) 856-9996   °[facility] [address], [place]  (336) 288-8857   °[facility] [address] [place] [address], [place] (336) 299-7000   °Veita Bland [address], [place]  ° (336) 373-1557 Only accepts [place] Access Medicaid patients after they have their name applied to their card.  ° °Self-Pay (no insurance) in [place]: ° °Organization         Address  Phone   Notes  °Sickle Cell Patients, [facility] [address], [place] (336) 832-1970   °[facility] [address], [place] (336) 832-4400   °[facility] ° [address], [place] (336) 992-4800   °[facility]/Dr. Osei-Bonsu ° [address] [place] [address], [place] or [address], [place] (336) [phone] Phone number for both [place] and [place] locations is the same.  °[facility] [address], [place] (336) 299-0000   °[facility] [address] [place] [address], [place] or [address] (336) 852-7530 °(336) 878-2260   °[facility] [address], [place] (336) 350-1642, phone; (336) 294-5005, fax Sees patients 1st and 3rd Saturday of every month.  Must not qualify for public or private insurance (i.e. Medicaid, Medicare, [place] Health Choice, Veterans' Benefits) • Household income should be no more than 200% of the poverty level •The clinic cannot treat you if you are pregnant or think you are pregnant • Sexually transmitted diseases are not treated at the clinic.  ° ° °Dental Care: °Organization         Address  Phone  Notes  °[place] [facility] [address],  (336) 641-6152 Accepts children up to age 21 who are enrolled in Medicaid or [place] Health Choice; pregnant women with a Medicaid card; and children who have applied for Medicaid or [place] Health Choice, but were declined, whose parents can pay a reduced fee at time of service.  °[place]  [facility] [place]  [address], [place] (336) 641-7733 Accepts children up to age 21 who are enrolled in Medicaid or [place] Health Choice; pregnant women with a Medicaid card; and children who have applied for Medicaid or [place] Health Choice, but were declined, whose parents can pay a reduced fee at time of service.  °[facility] ° [address],  (336) 641-4533 Patients are seen by appointment only. Walk-ins are not accepted. [facility] will see patients 18 years of age and older. °Monday - Tuesday (8am-5pm) °Most Wednesdays (8:30-5pm) °$30 per visit, cash only  °[facility] ° [address], [place] (336) 641-4533 Patients are seen by appointment only. Walk-ins are not accepted. [facility] will see patients 18 years of age and older. °One   Wednesday Evening (Monthly: Volunteer Based).  $30 per visit, cash only  °[facility]  (919) [phone] for adults; Children under age 4, call Graduate Pediatric Dentistry at ([phone]) [phone]. Children aged 4-14, please call ([phone]) [phone] to request a pediatric application. ° Dental services are provided in all areas of dental care including fillings, crowns and bridges, complete and partial dentures, implants, gum treatment, root canals, and extractions. Preventive care is also provided. Treatment is provided to both adults and children. °Patients are selected via a lottery and there is often a waiting list. °  °[facility] [address], °[place] ° (336) 763-8833 [email or web address].[email or web address] °  °[facility] [address], [place], [place] (336)723-1848, Ext. 123 Second and Fourth Thursday of each month, opens at 6:30 AM; Clinic ends at 9 AM.  Patients are seen on a first-come first-served basis, and a limited number are seen during each clinic.  ° °[facility] ° [address], [place], [place] (336) [phone]    Eligibility Requirements °You must have lived in [place], [place], or [place] counties for at least the last three months. °  You cannot be eligible for state or federal sponsored healthcare insurance, including Veterans Administration, Medicaid, or Medicare. °  You generally cannot be eligible for healthcare insurance through your employer.  °  How to apply: °Eligibility screenings are held every Tuesday and Wednesday afternoon from 1:00 pm until 4:00 pm. You do not need an appointment for the interview!  °[facility] [address], [place], [place] 336-631-2330   °[facility]  336-342-8273   °[facility]  336-703-3100   °[facility]  336-570-6415   ° °Behavioral Health Resources in the Community: °Intensive Outpatient Programs °Organization         Address  Phone  Notes  °[facility] [address], [place], [place] 336-878-6098   °[facility] [address], [place], [place] 336-832-9800   °[facility] [address], [place], [place] ° 336-882-2125   °[facility] [address],  °[place], [place] 1-800-853-5163 or 336-641-4981   °Substance Abuse Resources °Organization         Address  Phone  Notes  °[facility]  336-882-2125   °[facility]  336-784-9470   °[facility]  336-285-9073   °[facility]  336-845-3988   °Residential & Outpatient Substance Abuse Program  1-800-659-3381   °Psychological Services °Organization         Address  Phone  Notes  °[facility]  336- 832-9600   °[facility]  336- 378-7881   °[facility] [address], [place] 1-800-853-5163 or 336-641-4981   ° °Mobile Crisis Teams °Organization         Address  Phone  Notes  °[facility], Mobile Crisis Care Unit  1-877-626-1772   °Assertive °[facility] ° [address]  [place], [place] 336-834-9664   °Sharon DeEsch [address] °[place] 336-554-5454   ° °Self-Help/Support Groups °Organization         Address  Phone             Notes  °[facility] - variety of support groups  336- 373-1402 Call for more information  °Narcotics Anonymous (NA), [facility] [address], °[place]  2 meetings at this location  ° °  Residential Treatment Programs Organization         Address  Phone  Notes  [facility] [address],    [place]  1-(602) 668-3242   [facility]  [address], [place] [address], [place], [place]   [place], [place] [phone] Admissions: 8am-3pm M-F  [facility] [address],    [place], [name] [phone]   [facility] [address] [place], [place], [place]   [facility] [address]  [place], [place]   [facility] Intensive Outpatient [address], [place], [place]   [facility] [address]  [place], [name] 1-602-120-3573 or 361-246-3342   [facility] [address] [place], [place] Accepts Medicaid  [facility] [address],  [name] 1-(701)503-9451 Substance Abuse/Addiction Treatment   [facility]         Address  Phone  Notes  [facility]  (254)373-9186   Domenic Schwab, PhD [address] [place], [name]   819-503-7817 or (858)[phone]   [facility] [place] [employer] [place], [name] (810)[phone]   [facility] [address] [place], [name] [phone] Insurance/Medicaid/sponsorship through [facility] and Families [address] [place]                                    [place], [name] [phone] [place] [address], [name] [phone]    Dr. Adele Schilder  ([phone]   [facility] Dept. 1) [address] [place] 2) [place] 3)  [address], [place] [phone] [phone]  [phone]   [place] 2175293576 or [phone] (After Hours)      Take over the counter decongestant (such as sudafed), as directed on packaging, for the next week.  Use over the counter normal saline nasal spray, as instructed in the Emergency Department, several times per day for the next 2 weeks.  Take over the counter tylenol and ibuprofen, as directed on packaging, as needed for discomfort. Take the prescriptions as directed. Call your regular medical doctor tomorrow to schedule a follow up appointment in the next 2 days.  Return to the Emergency Department immediately if worsening.

## 2013-07-24 NOTE — Telephone Encounter (Signed)
error 

## 2013-07-24 NOTE — ED Notes (Signed)
Pt tolerating water po and states she feels hungry.

## 2013-07-30 ENCOUNTER — Ambulatory Visit (INDEPENDENT_AMBULATORY_CARE_PROVIDER_SITE_OTHER): Payer: 59 | Admitting: Nurse Practitioner

## 2013-07-30 ENCOUNTER — Encounter: Payer: Self-pay | Admitting: Nurse Practitioner

## 2013-07-30 ENCOUNTER — Ambulatory Visit: Payer: 59 | Admitting: Family Medicine

## 2013-07-30 ENCOUNTER — Emergency Department (HOSPITAL_COMMUNITY)
Admission: EM | Admit: 2013-07-30 | Discharge: 2013-07-30 | Disposition: A | Discharge status: Home / Self Care | Payer: 59 | Attending: Emergency Medicine | Admitting: Emergency Medicine

## 2013-07-30 ENCOUNTER — Encounter (HOSPITAL_COMMUNITY): Payer: Self-pay | Admitting: Emergency Medicine

## 2013-07-30 ENCOUNTER — Emergency Department (HOSPITAL_COMMUNITY): Payer: 59

## 2013-07-30 VITALS — BP 122/84 | Temp 98.0°F | Ht 69.0 in | Wt 125.0 lb

## 2013-07-30 DIAGNOSIS — Z8709 Personal history of other diseases of the respiratory system: Secondary | ICD-10-CM | POA: Insufficient documentation

## 2013-07-30 DIAGNOSIS — Z79899 Other long term (current) drug therapy: Secondary | ICD-10-CM | POA: Insufficient documentation

## 2013-07-30 DIAGNOSIS — Z8742 Personal history of other diseases of the female genital tract: Secondary | ICD-10-CM | POA: Insufficient documentation

## 2013-07-30 DIAGNOSIS — J189 Pneumonia, unspecified organism: Secondary | ICD-10-CM

## 2013-07-30 DIAGNOSIS — R509 Fever, unspecified: Secondary | ICD-10-CM

## 2013-07-30 DIAGNOSIS — R231 Pallor: Secondary | ICD-10-CM | POA: Insufficient documentation

## 2013-07-30 DIAGNOSIS — J159 Unspecified bacterial pneumonia: Secondary | ICD-10-CM | POA: Insufficient documentation

## 2013-07-30 DIAGNOSIS — Z3202 Encounter for pregnancy test, result negative: Secondary | ICD-10-CM | POA: Insufficient documentation

## 2013-07-30 DIAGNOSIS — Z87891 Personal history of nicotine dependence: Secondary | ICD-10-CM | POA: Insufficient documentation

## 2013-07-30 DIAGNOSIS — G8929 Other chronic pain: Secondary | ICD-10-CM | POA: Insufficient documentation

## 2013-07-30 DIAGNOSIS — Z91040 Latex allergy status: Secondary | ICD-10-CM | POA: Insufficient documentation

## 2013-07-30 LAB — COMPREHENSIVE METABOLIC PANEL
ALT: 7 U/L (ref 0–35)
AST: 14 U/L (ref 0–37)
Albumin: 3.8 g/dL (ref 3.5–5.2)
Alkaline Phosphatase: 63 U/L (ref 39–117)
Anion gap: 10 (ref 5–15)
BILIRUBIN TOTAL: 0.4 mg/dL (ref 0.3–1.2)
BUN: 6 mg/dL (ref 6–23)
CALCIUM: 8.8 mg/dL (ref 8.4–10.5)
CHLORIDE: 103 meq/L (ref 96–112)
CO2: 28 meq/L (ref 19–32)
CREATININE: 0.6 mg/dL (ref 0.50–1.10)
GFR calc non Af Amer: >90 mL/min (ref >90)
GLUCOSE: 85 mg/dL (ref 70–99)
Potassium: 4.1 mEq/L (ref 3.7–5.3)
Sodium: 141 mEq/L (ref 137–147)
Total Protein: 7.3 g/dL (ref 6.0–8.3)

## 2013-07-30 LAB — CBC WITH DIFFERENTIAL/PLATELET
Basophils Absolute: 0 10*3/uL (ref 0.0–0.1)
Basophils Relative: 1 % (ref 0–1)
EOS PCT: 2 % (ref 0–5)
Eosinophils Absolute: 0.1 10*3/uL (ref 0.0–0.7)
HEMATOCRIT: 36 % (ref 36.0–46.0)
Hemoglobin: 13.1 g/dL (ref 12.0–15.0)
LYMPHS ABS: 1.6 10*3/uL (ref 0.7–4.0)
LYMPHS PCT: 24 % (ref 12–46)
MCH: 32.8 pg (ref 26.0–34.0)
MCHC: 36.4 g/dL — ABNORMAL HIGH (ref 30.0–36.0)
MCV: 90 fL (ref 78.0–100.0)
MONO ABS: 0.6 10*3/uL (ref 0.1–1.0)
Monocytes Relative: 9 % (ref 3–12)
NEUTROS ABS: 4.2 10*3/uL (ref 1.7–7.7)
Neutrophils Relative %: 64 % (ref 43–77)
Platelets: 259 10*3/uL (ref 150–400)
RBC: 4 MIL/uL (ref 3.87–5.11)
RDW: 12.2 % (ref 11.5–15.5)
WBC: 6.4 10*3/uL (ref 4.0–10.5)

## 2013-07-30 LAB — URINALYSIS, ROUTINE W REFLEX MICROSCOPIC
Bilirubin Urine: NEGATIVE
Glucose, UA: NEGATIVE mg/dL
Hgb urine dipstick: NEGATIVE
KETONES UR: NEGATIVE mg/dL
Nitrite: NEGATIVE
Protein, ur: NEGATIVE mg/dL
Specific Gravity, Urine: <1.005 — ABNORMAL LOW (ref 1.005–1.030)
UROBILINOGEN UA: 0.2 mg/dL (ref 0.0–1.0)
pH: 7 (ref 5.0–8.0)

## 2013-07-30 LAB — URINE MICROSCOPIC-ADD ON

## 2013-07-30 LAB — PREGNANCY, URINE: PREG TEST UR: NEGATIVE

## 2013-07-30 MED ORDER — AZITHROMYCIN 250 MG PO TABS: ORAL_TABLET | ORAL | Status: DC

## 2013-07-30 MED ORDER — IPRATROPIUM-ALBUTEROL 0.5-2.5 (3) MG/3ML IN SOLN
3.0000 mL | Freq: Once | RESPIRATORY_TRACT | Status: AC
  Administered 2013-07-30: 3 mL via RESPIRATORY_TRACT
  Filled 2013-07-30: qty 3

## 2013-07-30 MED ORDER — DEXTROSE 5 % IV SOLN
500.0000 mg | Freq: Once | INTRAVENOUS | Status: AC
  Administered 2013-07-30: 500 mg via INTRAVENOUS
  Filled 2013-07-30: qty 500

## 2013-07-30 MED ORDER — PREDNISONE 10 MG PO TABS: 20.0000 mg | ORAL_TABLET | Freq: Every day | ORAL | Status: DC

## 2013-07-30 MED ORDER — ALBUTEROL SULFATE (2.5 MG/3ML) 0.083% IN NEBU
2.5000 mg | INHALATION_SOLUTION | Freq: Once | RESPIRATORY_TRACT | Status: AC
  Administered 2013-07-30: 2.5 mg via RESPIRATORY_TRACT
  Filled 2013-07-30: qty 3

## 2013-07-30 MED ORDER — DEXTROSE 5 % IV SOLN
1.0000 g | Freq: Once | INTRAVENOUS | Status: AC
  Administered 2013-07-30: 1 g via INTRAVENOUS
  Filled 2013-07-30: qty 10

## 2013-07-30 MED ORDER — ALBUTEROL SULFATE HFA 108 (90 BASE) MCG/ACT IN AERS: 1.0000 | INHALATION_SPRAY | Freq: Four times a day (QID) | RESPIRATORY_TRACT | Status: DC | PRN

## 2013-07-30 NOTE — Discharge Instructions (Signed)
X-ray shows pneumonia in the right lung. Increase fluids. Stop smoking. Prescription for antibiotic to start tomorrow, inhaler, prednisone. Use over-the-counter cough syrup.

## 2013-07-30 NOTE — ED Notes (Addendum)
Sent  From MD office.  With body aches, chest hurts, headache,  Fever at night.  Seen here 6/30 and went today  M[place] for recheck. Says has lost 20 lbs.  Alert,  Cough,  Vomiting, no diarrhea.

## 2013-07-30 NOTE — ED Notes (Signed)
Pt c/o left lower abdominal pain as well as SOB/cough, and n/v. Pt was seen here last week for same symptoms but states symptoms have since worsened. Pt was seen by Dr Wolfgang Phoenix and advised to return to ED.

## 2013-07-30 NOTE — ED Provider Notes (Signed)
CSN: [id number]     Arrival date & time 07/30/13  1129 History  This chart was scribed for Nat Christen, MD by Elby Beck, ED Scribe. This patient was seen in room APA08/APA08 and the patient's care was started at 12:29 PM.   Chief Complaint  Patient presents with  . Weakness    The history is provided by the patient. No language interpreter was used.    HPI Comments: Lisa Fields is a 33 y.o. female who presents to the Emergency Department complaining of a gradually worsening, nonproductive cough over the past 3 weeks. She states that she has been taking Tessalon pearls and using an Albuterol inhaler without relief. She states that she was seen by her PCP, Dr. Sallee Lange, earlier today for her symptoms. She reports associated fatigue, generalized myalgias and generalized abdominal pain. She states that she has lost about 20 pounds over the past 3 weeks. She states that she is a former smoker who quit 1 week ago. She works as a [employer] manager.   Past Medical History  Diagnosis Date  . Ovarian cyst   . Bronchitis   . Chronic headaches    Past Surgical History  Procedure Laterality Date  . Tubal ligation    . Appendectomy    . Ovarian cyst surgery     History reviewed. No pertinent family history. History  Substance Use Topics  . Smoking status: Former Smoker -- 0.50 packs/day    Types: Cigarettes    Start date: 07/23/2013  . Smokeless tobacco: Not on file  . Alcohol Use: No   OB History   Grav Para Term Preterm Abortions TAB SAB Ect Mult Living                 Review of Systems A complete 10 system review of systems was obtained and all systems are negative except as noted in the HPI and PMH.   Allergies  Ivp dye; Sulfa antibiotics; Codeine; Latex; Morphine and related; Topamax; and Vicodin  Home Medications   Prior to Admission medications   Medication Sig Start Date End Date Taking? Authorizing Provider  acetaminophen (TYLENOL) 500 MG tablet Take 1,000 mg by mouth  every 4 (four) hours as needed for moderate pain or fever.   Yes Historical Provider, MD  albuterol (PROVENTIL HFA;VENTOLIN HFA) 108 (90 BASE) MCG/ACT inhaler Inhale 2 puffs into the lungs every 4 (four) hours as needed for wheezing or shortness of breath (or cough). 07/24/13  Yes Alfonzo Feller, DO  benzonatate (TESSALON) 100 MG capsule Take 1 capsule (100 mg total) by mouth 3 (three) times daily as needed for cough. 07/24/13  Yes Alfonzo Feller, DO  citalopram (CELEXA) 10 MG tablet Take 10 mg by mouth daily.   Yes Historical Provider, MD  ibuprofen (ADVIL,MOTRIN) 200 MG tablet Take 400 mg by mouth every 4 (four) hours as needed for fever or moderate pain.   Yes Historical Provider, MD  ibuprofen (ADVIL,MOTRIN) 800 MG tablet Take 800 mg by mouth every 8 (eight) hours as needed. For headaches    Yes Historical Provider, MD  Melatonin 3 MG TABS Take 1 tablet (3 mg total) by mouth at bedtime as needed. 03/30/13  Yes Kathyrn Drown, MD  nicotine polacrilex (NICORETTE) 4 MG gum Take 4 mg by mouth as needed for smoking cessation.   Yes Historical Provider, MD  albuterol (PROVENTIL HFA;VENTOLIN HFA) 108 (90 BASE) MCG/ACT inhaler Inhale 1-2 puffs into the lungs every 6 (six) hours as needed for  wheezing or shortness of breath. 07/30/13   Nat Christen, MD  azithromycin (ZITHROMAX Z-PAK) 250 MG tablet 2 po day one, then 1 daily x 4 days 07/30/13   Nat Christen, MD  predniSONE (DELTASONE) 10 MG tablet Take 2 tablets (20 mg total) by mouth daily. 07/30/13   Nat Christen, MD   Triage Vitals: BP 105/71  Pulse 84  Temp(Src) 97.5 F (36.4 C) (Oral)  Resp 20  Ht 5\' 9"  (1.753 m)  Wt 125 lb (56.7 kg)  BMI 18.45 kg/m2  SpO2 99%  LMP 07/24/2013  Physical Exam  Nursing note and vitals reviewed. Constitutional: She is oriented to person, place, and time. She appears well-developed and well-nourished.  HENT:  Head: Normocephalic and atraumatic.  Eyes: Conjunctivae and EOM are normal. Pupils are equal, round, and  reactive to light.  Neck: Normal range of motion. Neck supple.  Cardiovascular: Normal rate, regular rhythm and normal heart sounds.   Pulmonary/Chest: Effort normal and breath sounds normal. No respiratory distress. She has no wheezes. She has no rales.  Abdominal: Soft. Bowel sounds are normal. There is no tenderness.  Musculoskeletal: Normal range of motion.  Neurological: She is alert and oriented to person, place, and time.  Skin: Skin is warm and dry. There is pallor.  Psychiatric: She has a normal mood and affect. Her behavior is normal.    ED Course  Procedures (including critical care time)  DIAGNOSTIC STUDIES: Oxygen Saturation is 99% on RA, normal by my interpretation.    COORDINATION OF CARE: 12:33 PM- Pt advised of plan for treatment and pt agrees.  Results for orders placed during the hospital encounter of 07/30/13  CBC WITH DIFFERENTIAL      Result Value Ref Range   WBC 6.4  4.0 - 10.5 K/uL   RBC 4.00  3.87 - 5.11 MIL/uL   Hemoglobin 13.1  12.0 - 15.0 g/dL   HCT 36.0  36.0 - 46.0 %   MCV 90.0  78.0 - 100.0 fL   MCH 32.8  26.0 - 34.0 pg   MCHC 36.4 (*) 30.0 - 36.0 g/dL   RDW 12.2  11.5 - 15.5 %   Platelets 259  150 - 400 K/uL   Neutrophils Relative % 64  43 - 77 %   Neutro Abs 4.2  1.7 - 7.7 K/uL   Lymphocytes Relative 24  12 - 46 %   Lymphs Abs 1.6  0.7 - 4.0 K/uL   Monocytes Relative 9  3 - 12 %   Monocytes Absolute 0.6  0.1 - 1.0 K/uL   Eosinophils Relative 2  0 - 5 %   Eosinophils Absolute 0.1  0.0 - 0.7 K/uL   Basophils Relative 1  0 - 1 %   Basophils Absolute 0.0  0.0 - 0.1 K/uL  COMPREHENSIVE METABOLIC PANEL      Result Value Ref Range   Sodium 141  137 - 147 mEq/L   Potassium 4.1  3.7 - 5.3 mEq/L   Chloride 103  96 - 112 mEq/L   CO2 28  19 - 32 mEq/L   Glucose, Bld 85  70 - 99 mg/dL   BUN 6  6 - 23 mg/dL   Creatinine, Ser 0.60  0.50 - 1.10 mg/dL   Calcium 8.8  8.4 - 10.5 mg/dL   Total Protein 7.3  6.0 - 8.3 g/dL   Albumin 3.8  3.5 - 5.2 g/dL    AST 14  0 - 37 U/L   ALT 7  0 - 35 U/L   Alkaline Phosphatase 63  39 - 117 U/L   Total Bilirubin 0.4  0.3 - 1.2 mg/dL   GFR calc non Af Amer >90  >90 mL/min   GFR calc Af Amer >90  >90 mL/min   Anion gap 10  5 - 15  URINALYSIS, ROUTINE W REFLEX MICROSCOPIC      Result Value Ref Range   Color, Urine YELLOW  YELLOW   APPearance CLEAR  CLEAR   Specific Gravity, Urine <1.005 (*) 1.005 - 1.030   pH 7.0  5.0 - 8.0   Glucose, UA NEGATIVE  NEGATIVE mg/dL   Hgb urine dipstick NEGATIVE  NEGATIVE   Bilirubin Urine NEGATIVE  NEGATIVE   Ketones, ur NEGATIVE  NEGATIVE mg/dL   Protein, ur NEGATIVE  NEGATIVE mg/dL   Urobilinogen, UA 0.2  0.0 - 1.0 mg/dL   Nitrite NEGATIVE  NEGATIVE   Leukocytes, UA TRACE (*) NEGATIVE  PREGNANCY, URINE      Result Value Ref Range   Preg Test, Ur NEGATIVE  NEGATIVE  URINE MICROSCOPIC-ADD ON      Result Value Ref Range   Squamous Epithelial / LPF FEW (*) RARE   WBC, UA 0-2  <3 WBC/hpf   Imaging Review Dg Chest 2 View  07/30/2013   CLINICAL DATA:  Weakness, weight loss  EXAM: CHEST  2 VIEW  COMPARISON:  07/24/2013, 07/30/2012  FINDINGS: Normal heart size, mediastinal contours, and pulmonary vascularity.  Atelectasis versus infiltrate medial RIGHT lower lobe unchanged since prior exam but new since 2014.  Remaining lungs appear hyperinflated but clear.  No pleural effusion or pneumothorax.  No acute osseous findings.  IMPRESSION: Hyperinflated lungs with atelectasis versus consolidation in medial RIGHT lower lobe.   Electronically Signed   By: Lavonia Dana M.D.   On: 07/30/2013 14:00     EKG Interpretation None      MDM   Final diagnoses:  Community acquired pneumonia    Patient is hemodynamically stable. Chest x-ray reveals a medial right lobe consolidation. Rx IV Zithromax, IV Rocephin, Albuterol/Atrovent nebulizer treatment.   Discharge medications Zithromax, albuterol inhaler, prednisone.   I personally performed the services described in this  documentation, which was scribed in my presence. The recorded information has been reviewed and is accurate.   Nat Christen, MD 07/31/13 [phone]

## 2013-08-01 ENCOUNTER — Encounter: Payer: Self-pay | Admitting: Nurse Practitioner

## 2013-08-01 NOTE — Progress Notes (Signed)
Subjective:  Presents for complaints of persistent cough after being seen at the local ED on 6/30 and diagnosed with cough/viral illness. Symptoms have not improved in fact cough is worse especially at nighttime. Nausea with loss of appetite, taking fluids well. Weakness. Joint and muscle aches. Nonproductive cough. Difficulty sleeping due to tightness in her chest with deep breath or cough. No wheezing. No reflux. Mild upper mid abdominal pain. Bowels normal limit. Saw slight improvement wash was on Augmentin, within 2 days of finishing her symptoms came back and were worse. No sore throat. Ethmoid sinus area headache. No urinary frequency or dysuria. No vaginal discharge. Just finished her cycle 2 days ago. Uses pads, no tampons. Has had a low-grade fever Max temp 100.4.  Objective:   BP 122/84  Temp(Src) 98 F (36.7 C)  Ht 5\' 9"  (1.753 m)  Wt 125 lb (56.7 kg)  BMI 18.45 kg/m2  LMP 07/24/2013 NAD. Alert, oriented. Very dramatic during exam, difficulty assessing actual level of pain. TMs mild clear effusion, no erythema. Pharynx clear. Neck supple with mild soft anterior adenopathy. Lungs clear. Heart regular rate rhythm. No wheezing or tachypnea noted. Patient taking frequent deep breaths and complaining of shortness of breath. Also generalized chest wall pain worse with coughing. Complaints of pain even with very light touch her chest wall or abdomen. At one point patient states if she felt like she could not sit up anymore and had to lay down.  Assessment:Febrile illness  Plan: Difficulty assessing the true level of patient's illness due to increased response to pain. Patient sent directly to ER for further workup. Our office contacted the triage nurse. Recheck here as needed. Patient states she could drive herself to ER.

## 2013-11-02 ENCOUNTER — Ambulatory Visit (INDEPENDENT_AMBULATORY_CARE_PROVIDER_SITE_OTHER): Payer: 59 | Admitting: Family Medicine

## 2013-11-02 ENCOUNTER — Encounter: Payer: Self-pay | Admitting: Family Medicine

## 2013-11-02 VITALS — BP 102/70 | Temp 98.4°F | Ht 69.0 in | Wt 138.5 lb

## 2013-11-02 DIAGNOSIS — B349 Viral infection, unspecified: Secondary | ICD-10-CM

## 2013-11-02 DIAGNOSIS — J029 Acute pharyngitis, unspecified: Secondary | ICD-10-CM

## 2013-11-02 LAB — POCT RAPID STREP A (OFFICE): Rapid Strep A Screen: NEGATIVE

## 2013-11-02 MED ORDER — CITALOPRAM HYDROBROMIDE 10 MG PO TABS: 10.0000 mg | ORAL_TABLET | Freq: Every day | ORAL | Status: DC

## 2013-11-02 MED ORDER — DOXYCYCLINE HYCLATE 100 MG PO CAPS: 100.0000 mg | ORAL_CAPSULE | Freq: Two times a day (BID) | ORAL | Status: DC

## 2013-11-02 MED ORDER — TRAMADOL HCL 50 MG PO TABS: 50.0000 mg | ORAL_TABLET | Freq: Four times a day (QID) | ORAL | Status: DC | PRN

## 2013-11-02 NOTE — Progress Notes (Signed)
   Subjective:    Patient ID: Lisa Fields, female    DOB: 1980-07-20, 33 y.o.   MRN: [id number]  Sore Throat  This is a new problem. The current episode started in the past 7 days. The problem has been unchanged. Neither side of throat is experiencing more pain than the other. The maximum temperature recorded prior to her arrival was 101 - 101.9 F. The pain is severe. Associated symptoms include swollen glands and trouble swallowing. Pertinent negatives include no congestion, ear pain or shortness of breath. Exposure to: MRSA. Treatments tried: theraflu, Nyquil. The treatment provided no relief.  Patient states she has no other concerns at this time.     Review of Systems  Constitutional: Positive for chills and fatigue. Negative for fever and activity change.  HENT: Positive for sore throat and trouble swallowing. Negative for congestion, ear pain and rhinorrhea.   Eyes: Negative for discharge.  Respiratory: Negative for shortness of breath and wheezing.   Cardiovascular: Negative for chest pain.       Objective:   Physical Exam Eardrums normal throat is normal neck is supple subjective sore throat lungs clear no crackles       Assessment & Plan:  Viral syndrome/pharyngitis/if not doing better by early next week then I would recommend CBC and possibly other testing

## 2013-11-03 LAB — STREP A DNA PROBE: GASP: NEGATIVE

## 2013-11-05 ENCOUNTER — Telehealth: Payer: Self-pay | Admitting: Family Medicine

## 2013-11-05 DIAGNOSIS — J029 Acute pharyngitis, unspecified: Secondary | ICD-10-CM

## 2013-11-05 NOTE — Telephone Encounter (Signed)
Patient notified that bloodwork has been ordered and she stated she will report to the lab and have it done today.

## 2013-11-05 NOTE — Telephone Encounter (Signed)
First step is doing some blood work she needs to do a CBC and Monospot. Await the findings of this may need to recheck her later in the week if fever is not breaking. You can let the patient know I am happy to receive her sooner but blood work is what is necessary at this point. If she can get it drawn today that would be great. We should be up and notify her with him 48 hours of the results

## 2013-11-05 NOTE — Telephone Encounter (Signed)
Pt was seen 10/9 an was told if she was not better by Monday to call the  Office and let us know. Was told she would have to go get BW done.  Can we put in the order and call the pt when sent

## 2013-11-05 NOTE — Telephone Encounter (Signed)
Pt states her lumph nodes are swollen, sore throat, can only eat soup because crackers and bread get hung in her throat, no trouble breathing, fever at night 101, fatigue, sleeping a lot.

## 2013-11-06 LAB — CBC WITH DIFFERENTIAL/PLATELET
BASOS PCT: 0 % (ref 0–1)
Basophils Absolute: 0 10*3/uL (ref 0.0–0.1)
Eosinophils Absolute: 0.1 10*3/uL (ref 0.0–0.7)
Eosinophils Relative: 1 % (ref 0–5)
HEMATOCRIT: 38.3 % (ref 36.0–46.0)
HEMOGLOBIN: 13.8 g/dL (ref 12.0–15.0)
LYMPHS ABS: 2.4 10*3/uL (ref 0.7–4.0)
LYMPHS PCT: 34 % (ref 12–46)
MCH: 32.2 pg (ref 26.0–34.0)
MCHC: 36 g/dL (ref 30.0–36.0)
MCV: 89.5 fL (ref 78.0–100.0)
MONO ABS: 0.6 10*3/uL (ref 0.1–1.0)
MONOS PCT: 8 % (ref 3–12)
NEUTROS ABS: 4 10*3/uL (ref 1.7–7.7)
NEUTROS PCT: 57 % (ref 43–77)
Platelets: 307 10*3/uL (ref 150–400)
RBC: 4.28 MIL/uL (ref 3.87–5.11)
RDW: 13.2 % (ref 11.5–15.5)
WBC: 7.1 10*3/uL (ref 4.0–10.5)

## 2013-11-06 LAB — MONONUCLEOSIS SCREEN: MONO SCREEN: NEGATIVE

## 2013-11-09 ENCOUNTER — Other Ambulatory Visit: Payer: Self-pay | Admitting: *Deleted

## 2013-11-09 MED ORDER — DOXYCYCLINE HYCLATE 100 MG PO CAPS: 100.0000 mg | ORAL_CAPSULE | Freq: Two times a day (BID) | ORAL | Status: DC

## 2013-11-09 NOTE — Telephone Encounter (Signed)
Patient notified and verbalized understanding. 

## 2013-11-19 ENCOUNTER — Encounter: Payer: Self-pay | Admitting: Family Medicine

## 2013-11-19 ENCOUNTER — Ambulatory Visit (INDEPENDENT_AMBULATORY_CARE_PROVIDER_SITE_OTHER): Payer: 59 | Admitting: Family Medicine

## 2013-11-19 ENCOUNTER — Ambulatory Visit (HOSPITAL_COMMUNITY)
Admission: RE | Admit: 2013-11-19 | Discharge: 2013-11-19 | Disposition: A | Discharge status: Home / Self Care | Payer: 59 | Source: Ambulatory Visit | Attending: Family Medicine | Admitting: Family Medicine

## 2013-11-19 VITALS — Temp 98.2°F | Wt 139.8 lb

## 2013-11-19 DIAGNOSIS — R531 Weakness: Secondary | ICD-10-CM | POA: Insufficient documentation

## 2013-11-19 DIAGNOSIS — R05 Cough: Secondary | ICD-10-CM | POA: Insufficient documentation

## 2013-11-19 DIAGNOSIS — J208 Acute bronchitis due to other specified organisms: Secondary | ICD-10-CM

## 2013-11-19 MED ORDER — CLARITHROMYCIN 500 MG PO TABS: 500.0000 mg | ORAL_TABLET | Freq: Two times a day (BID) | ORAL | Status: DC

## 2013-11-19 NOTE — Progress Notes (Signed)
   Subjective:    Patient ID: Lisa Fields, female    DOB: [birth year], 33 y.o.   MRN: [id number]  Cough This is a new problem. The current episode started 1 to 4 weeks ago. Associated symptoms include a fever and myalgias. She has tried prescription cough suppressant (doxycycline) for the symptoms.   She's been taking the doxycycline. Originally it was prescribed for the possibility of MRSA should it come back but when she spoke with the nurse regarding her lab results today instructed her to go ahead and take the doxycycline. I don't believe it has hurt her but I also don't believe it has helped her. She does smoke she notes she needs to quit.   Review of Systems  Constitutional: Positive for fever.  Respiratory: Positive for cough.   Musculoskeletal: Positive for myalgias.       Objective:   Physical Exam  Nursing note and vitals reviewed. Constitutional: She appears well-developed.  HENT:  Head: Normocephalic.  Nose: Nose normal.  Mouth/Throat: Oropharynx is clear and moist. No oropharyngeal exudate.  Neck: Neck supple.  Cardiovascular: Normal rate and normal heart sounds.   No murmur heard. Pulmonary/Chest: Effort normal and breath sounds normal. She has no wheezes.  Lymphadenopathy:    She has no cervical adenopathy.  Skin: Skin is warm and dry.   I do not hear any crackles in her lungs she does have a deep cough and a persistent cough.       Assessment & Plan:  I believe this patient needs to take the next several days off. I believe she needs to rest take the antibiotic that was prescribed today in follow-up if progressive symptoms we will also go ahead and do a chest x-ray.

## 2013-12-26 ENCOUNTER — Ambulatory Visit (INDEPENDENT_AMBULATORY_CARE_PROVIDER_SITE_OTHER): Payer: 59 | Admitting: Family Medicine

## 2013-12-26 ENCOUNTER — Encounter: Payer: Self-pay | Admitting: Family Medicine

## 2013-12-26 VITALS — BP 100/70 | Temp 98.7°F | Ht 69.0 in | Wt 140.0 lb

## 2013-12-26 DIAGNOSIS — J329 Chronic sinusitis, unspecified: Secondary | ICD-10-CM

## 2013-12-26 MED ORDER — AZITHROMYCIN 250 MG PO TABS: ORAL_TABLET | ORAL | Status: DC

## 2013-12-26 NOTE — Progress Notes (Signed)
   Subjective:    Patient ID: Lisa Fields, female    DOB: October 27, 1980, 33 y.o.   MRN: [id number]  Cough This is a new problem. The current episode started in the past 7 days. The problem has been gradually worsening. The problem occurs constantly. The cough is productive of sputum. Associated symptoms include headaches and myalgias. Associated symptoms comments: Congestion . Nothing aggravates the symptoms. She has tried OTC cough suppressant (Zicam) for the symptoms. The treatment provided no relief.  Patient has no other concerns at this time.   Prod green phlegnm  No sig fever  Frontal headaches  Pneum does smoke  Trying to quit    Review of Systems  Respiratory: Positive for cough.   Musculoskeletal: Positive for myalgias.  Neurological: Positive for headaches.       Objective:   Physical Exam  Alert mild malaise. Vital stable frontal maxillary tenderness. Pharynx normal neck supple. Lungs no wheezes some rhonchi no crackles no tachypnea heart regular rhythm.      Assessment & Plan:  Impression post viral rhinosinusitis/bronchitis plan antibiotics prescribed. Symptomatic care discussed. Warning signs discussed. WSL

## 2013-12-31 ENCOUNTER — Telehealth: Payer: Self-pay | Admitting: Family Medicine

## 2013-12-31 MED ORDER — AMOXICILLIN-POT CLAVULANATE 875-125 MG PO TABS: 1.0000 | ORAL_TABLET | Freq: Two times a day (BID) | ORAL | Status: DC

## 2013-12-31 NOTE — Telephone Encounter (Signed)
Per Dr Nicki Reaper, called in another antibiotic. Told pt to call us back if s/s persist or get worst. Pt verbalized understanding.

## 2013-12-31 NOTE — Telephone Encounter (Signed)
Patient was seen on 12/26/13 for rhinosinusitis.  She is still having cough, head congestion, chest pain, dark green mucous.  Please advise.   [facility].

## 2014-01-04 DIAGNOSIS — Z029 Encounter for administrative examinations, unspecified: Secondary | ICD-10-CM

## 2014-04-30 ENCOUNTER — Encounter: Payer: Self-pay | Admitting: Family Medicine

## 2014-04-30 ENCOUNTER — Ambulatory Visit (INDEPENDENT_AMBULATORY_CARE_PROVIDER_SITE_OTHER): Payer: 59 | Admitting: Family Medicine

## 2014-04-30 ENCOUNTER — Ambulatory Visit (HOSPITAL_COMMUNITY)
Admission: RE | Admit: 2014-04-30 | Discharge: 2014-04-30 | Disposition: A | Discharge status: Home / Self Care | Payer: 59 | Source: Ambulatory Visit | Attending: Family Medicine | Admitting: Family Medicine

## 2014-04-30 VITALS — BP 108/78 | Ht 69.0 in | Wt 149.0 lb

## 2014-04-30 DIAGNOSIS — M25461 Effusion, right knee: Secondary | ICD-10-CM | POA: Diagnosis not present

## 2014-04-30 DIAGNOSIS — M25561 Pain in right knee: Secondary | ICD-10-CM | POA: Insufficient documentation

## 2014-04-30 NOTE — Progress Notes (Signed)
   Subjective:    Patient ID: Lisa Fields, female    DOB: 30-Nov-1980, 34 y.o.   MRN: [id number]  Knee Pain  Incident onset: 4:30 am. The incident occurred in the street. The injury mechanism was a fall. The pain is present in the right knee. The quality of the pain is described as burning. The pain is moderate. The pain has been worsening since onset. Associated symptoms include an inability to bear weight. She reports no foreign bodies present. The symptoms are aggravated by movement and weight bearing. She has tried ice, immobilization and NSAIDs for the symptoms. The treatment provided no relief.      Review of Systems Relates knee pain discomfort.    Objective:   Physical Exam  On physical exam the patient has a very sore and tender right knee there is no obvious swelling but patient is unable to allow for ligament exam because of the severity of the pain ankles normal calf is normal she is able to move her foot up and now      Assessment & Plan:  Probable ligament tear recommend x-ray recommend follow-up office visit in a few days cold compresses frequently use knee brace no work this week follow-up exam later this week if ongoing trouble referral to orthopedics

## 2014-05-01 ENCOUNTER — Telehealth: Payer: Self-pay | Admitting: Family Medicine

## 2014-05-01 NOTE — Telephone Encounter (Signed)
Patient was seen yesterday and now has blister where the ice pack was its tingling,burning red. What can she use? Should she keep using the ice pack? She uses [employer]

## 2014-05-01 NOTE — Telephone Encounter (Signed)
Notified patient per Dr. Nicki Reaper to avoid ice on the area right now and when using cold compresses only leave on the area for no more than 15 minutes and wrap ice pack with a cloth before applying to area.

## 2014-05-03 ENCOUNTER — Telehealth: Payer: Self-pay | Admitting: *Deleted

## 2014-05-03 ENCOUNTER — Ambulatory Visit (INDEPENDENT_AMBULATORY_CARE_PROVIDER_SITE_OTHER): Payer: 59 | Admitting: Family Medicine

## 2014-05-03 ENCOUNTER — Encounter: Payer: Self-pay | Admitting: Family Medicine

## 2014-05-03 VITALS — BP 102/78 | Ht 69.0 in | Wt 145.0 lb

## 2014-05-03 DIAGNOSIS — M25561 Pain in right knee: Secondary | ICD-10-CM

## 2014-05-03 NOTE — Telephone Encounter (Signed)
[facility]. MRI scheduled at [facility] April 13th [place] 3:45.

## 2014-05-03 NOTE — Telephone Encounter (Signed)
Patient notified MRI scheduled at [facility] April 13th [place] 3:45.

## 2014-05-03 NOTE — Progress Notes (Signed)
   Subjective:    Patient ID: Lisa Fields, female    DOB: December 16, 1980, 34 y.o.   MRN: [id number]  HPI Patient is here today for a recheck on her right knee pain.  She said it is slowly getting better, however, she has "ice burn" from the ice pack.  The patient was running a few days ago she felt like something blew out in her knee and immediately went down one was unable to walk on it she came here a few days ago we did x-ray which was negative today the pain is doing better but she still unable to put much pressure on it and complains of severe pain she is unable to straighten her leg and she is unable to flex it more than 45  Review of Systems     Objective:   Physical Exam  The patient has area on the back of her leg that appears to be more related to a second-degree burn of the skin should heal completely there is no active blistering currently. Has excoriated area   The patient is unable to flex her knee more than 45 without severe pain there is some mild instability of the ligaments when anterior drawer test is tried. The knee is swollen. Tender.      Assessment & Plan:  Given the findings on exam and the lack of progress I am highly concerned about ligament injury I recommend MRI of the knee I also recommend orthopedic referral

## 2014-05-08 ENCOUNTER — Telehealth: Payer: Self-pay | Admitting: Family Medicine

## 2014-05-08 ENCOUNTER — Ambulatory Visit (HOSPITAL_COMMUNITY)
Admission: RE | Admit: 2014-05-08 | Discharge: 2014-05-08 | Disposition: A | Discharge status: Home / Self Care | Payer: 59 | Source: Ambulatory Visit | Attending: Family Medicine | Admitting: Family Medicine

## 2014-05-08 DIAGNOSIS — M25561 Pain in right knee: Secondary | ICD-10-CM

## 2014-05-08 DIAGNOSIS — X58XXXA Exposure to other specified factors, initial encounter: Secondary | ICD-10-CM | POA: Diagnosis not present

## 2014-05-08 MED ORDER — TRAMADOL HCL 50 MG PO TABS: 50.0000 mg | ORAL_TABLET | Freq: Four times a day (QID) | ORAL | Status: DC | PRN

## 2014-05-08 NOTE — Telephone Encounter (Signed)
Patient notified

## 2014-05-08 NOTE — Telephone Encounter (Signed)
Last seen 05/03/14

## 2014-05-08 NOTE — Telephone Encounter (Signed)
Pt is requesting a refill on her tramadol.

## 2014-05-08 NOTE — Telephone Encounter (Signed)
May refill tramadol times 2

## 2014-05-23 ENCOUNTER — Encounter: Payer: Self-pay | Admitting: Orthopedic Surgery

## 2014-05-23 ENCOUNTER — Ambulatory Visit (INDEPENDENT_AMBULATORY_CARE_PROVIDER_SITE_OTHER): Payer: 59 | Admitting: Orthopedic Surgery

## 2014-05-23 VITALS — BP 112/81 | Ht 69.0 in | Wt 145.0 lb

## 2014-05-23 DIAGNOSIS — S76301A Unspecified injury of muscle, fascia and tendon of the posterior muscle group at thigh level, right thigh, initial encounter: Secondary | ICD-10-CM | POA: Diagnosis not present

## 2014-05-23 DIAGNOSIS — Z029 Encounter for administrative examinations, unspecified: Secondary | ICD-10-CM

## 2014-05-23 MED ORDER — CYCLOBENZAPRINE HCL 5 MG PO TABS: 5.0000 mg | ORAL_TABLET | Freq: Three times a day (TID) | ORAL | Status: DC | PRN

## 2014-05-23 NOTE — Progress Notes (Signed)
Patient ID: Lisa Fields, female   DOB: [birth year], 33 y.o.   MRN: [id number]  Chief Complaint  Patient presents with  . Knee Pain    Right knee pain, heard a pop and fell,  REF S LUKING     Lisa Fields is a 34 y.o. female.   HPI 34 year old female was injured when she was running up an incline and fell. She says she placed her leg down and it just gave way she felt a pop. 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 Her x-rays were negative she had an MRI because she was not improving and that was normal she comes in for evaluation and treatment  She complains of pain and swelling primarily in the back of the knee on the medial side over the medial hamstrings but she's developed stiffness flexion contracture and inability to fully regain range of motion. She is supported by a brace and that allows her to ambulate although with a flexed knee she complains of dull throbbing constant pain which is 5 out of 10 and she takes Aleve and tramadol noting that when she turns her foot in external rotation she can walk a little bit better Review of Systems Fatigue joint pain swollen joint some back pain ankle leg swelling denies numbness tingling hayfever skin rash or ulceration other than from the ice burn  She we should know she has several several medical allergies SL and sulfa latex contrast dyes codeine. Codeine causes hallucination. I sensitivity to morphine.  Past Medical History  Diagnosis Date  . Ovarian cyst   . Bronchitis   . Chronic headaches     Past Surgical History  Procedure Laterality Date  . Tubal ligation    . Appendectomy    . Ovarian cyst surgery      History reviewed. No pertinent family history.  Social History History  Substance Use Topics  . Smoking status: Current Every Day Smoker -- 0.50 packs/day    Types: Cigarettes    Start  date: 07/23/2013  . Smokeless tobacco: Not on file  . Alcohol Use: No    Allergies  Allergen Reactions  . Ivp Dye [Iodinated Diagnostic Agents] Shortness Of Breath and Rash  . Sulfa Antibiotics Nausea And Vomiting  . Codeine Other (See Comments)    "I'm Delusional"  . Latex Hives  . Morphine And Related Other (See Comments)    "bottoms out my blood pressure"  . Topamax [Topiramate]     Hot flashes, leg pain  . Vicodin [Hydrocodone-Acetaminophen] Other (See Comments)    Hallucinations: "I see spiders"    Current Outpatient Prescriptions  Medication Sig Dispense Refill  . citalopram (CELEXA) 10 MG tablet Take 1 tablet (10 mg total) by mouth daily. 30 tablet 6  . ibuprofen (ADVIL,MOTRIN) 200 MG tablet Take 400 mg by mouth every 4 (four) hours as needed for fever or moderate pain.    . Multiple Vitamin (MULTIVITAMIN) tablet Take 1 tablet by mouth daily.    . Naproxen Sod-Diphenhydramine 220-25 MG TABS Take by mouth.    . traMADol (ULTRAM) 50 MG tablet Take 1 tablet (50 mg total) by mouth every 6 (six) hours as needed. 25 tablet 2  . albuterol (PROVENTIL HFA;VENTOLIN HFA) 108 (90 BASE) MCG/ACT inhaler Inhale 1-2 puffs into the lungs every 6 (six) hours as  needed for wheezing or shortness of breath. (Patient not taking: Reported on 05/23/2014) 1 Inhaler 0  . cyclobenzaprine (FLEXERIL) 5 MG tablet Take 1 tablet (5 mg total) by mouth every 8 (eight) hours as needed for muscle spasms. 60 tablet 1  . Melatonin 3 MG TABS Take 1 tablet (3 mg total) by mouth at bedtime as needed. (Patient not taking: Reported on 05/23/2014) 30 tablet 6   No current facility-administered medications for this visit.       Physical Exam Blood pressure 112/81, height 5\' 9"  (1.753 m), weight 145 lb (65.772 kg), last menstrual period 05/02/2014. Physical Exam The patient is well developed well nourished and well groomed. Orientation to person place and time is normal  Mood is pleasant. Ambulatory status she is  ambulatory but walks on her tiptoes really not with a heel toe gait has a flexion contracture at the knee Reexamined her prone she has tenderness over the medial hamstring no defect she can flex the knee though it is difficult. She has some oozing ecchymosis and skin changes noted there. She also has a residuals from the burn. Motor exam is otherwise normal knee is stable in flexion and extension. Range of motion limited actively from 20 to 90 passively from 15 to 110. Skin as stated sensation normal vascularity and the limb is normal color of the limb is normal. Opposite leg was used for comparison and it was normal with normal range of motion stability and strength testing  Data Reviewed I reviewed her MRI and x-rays are for knee films are normal. I've independently interpreted her MRI is normal as well.  Assessment Encounter Diagnosis  Name Primary?  . Hamstring injury, right, initial encounter Yes    Plan Recommend physical therapy Flexeril for muscle spasms as her hamstring was very tight  Recommend out of work for another 4 weeks follow-up 4 weeks take Flexeril 5 mg every 8 #60 continue tramadol and Aleve

## 2014-05-23 NOTE — Patient Instructions (Addendum)
Out of work four weeks  Continue tramadol and aleve  CALL [facility]PT TO ARRANGE PT

## 2014-06-12 ENCOUNTER — Ambulatory Visit (HOSPITAL_COMMUNITY): Payer: 59 | Attending: Family Medicine | Admitting: Physical Therapy

## 2014-06-12 DIAGNOSIS — S76301D Unspecified injury of muscle, fascia and tendon of the posterior muscle group at thigh level, right thigh, subsequent encounter: Secondary | ICD-10-CM | POA: Insufficient documentation

## 2014-06-12 DIAGNOSIS — R293 Abnormal posture: Secondary | ICD-10-CM | POA: Insufficient documentation

## 2014-06-12 DIAGNOSIS — M25561 Pain in right knee: Secondary | ICD-10-CM | POA: Diagnosis not present

## 2014-06-12 DIAGNOSIS — X58XXXD Exposure to other specified factors, subsequent encounter: Secondary | ICD-10-CM | POA: Insufficient documentation

## 2014-06-12 DIAGNOSIS — R269 Unspecified abnormalities of gait and mobility: Secondary | ICD-10-CM | POA: Diagnosis not present

## 2014-06-12 DIAGNOSIS — R609 Edema, unspecified: Secondary | ICD-10-CM | POA: Insufficient documentation

## 2014-06-12 DIAGNOSIS — S76301A Unspecified injury of muscle, fascia and tendon of the posterior muscle group at thigh level, right thigh, initial encounter: Secondary | ICD-10-CM

## 2014-06-12 DIAGNOSIS — Z7409 Other reduced mobility: Secondary | ICD-10-CM

## 2014-06-12 NOTE — Patient Instructions (Signed)
Achilles / Gastroc, Standing   Stand, right foot behind, heel on floor and turned slightly out, leg straight, forward leg bent. Move hips forward. Hold _30__ seconds. Repeat _2__ times per session. Do _2__ sessions per day.  Copyright  VHI. All rights reserved.   Hamstring Stretch   Place sound foot on the floor, keep back and residual limb knee straight. Wrap a sheet around your toes and pull until you feel a stretch; if you have a sharp pain, reduce your pressure.  . Hold __30__ seconds. Repeat __2__ times. Do __2__ sessions per day.  Copyright  VHI. All rights reserved.   Long [employer]   Tighten muscle on front of thigh and extend leg. . Curl leg back as far as possible.  DO NOT work through any pain- when your pain starts to increase, stop the motion. Repeat __10__ times. Do __2__ sessions per day.  Copyright  VHI. All rights reserved.   Heel Raises   Stand with support. Tighten pelvic floor and hold. With knees straight, raise heels off ground.  Relax for _3__ seconds. Repeat _10__ times. Do _2__ times a day.  Copyright  VHI. All rights reserved.

## 2014-06-12 NOTE — Therapy (Addendum)
[facility] [place], [name], [address] Phone: [phone]   Fax:  [phone]  Physical Therapy Evaluation  Patient Details  Name: Lisa Fields MRN: [id number] Date of Birth: Aug 19, 1980 Referring Provider:  Kathyrn Drown, MD  Encounter Date: 06/12/2014      PT End of Session - 06/12/14 1211    Visit Number 1   Number of Visits 18   Date for PT Re-Evaluation 07/10/14   Authorization Type UMR    Authorization - Visit Number 1   Authorization - Number of Visits 24   Activity Tolerance Patient tolerated treatment well;Patient limited by pain   Behavior During Therapy [facility] for tasks assessed/performed      Past Medical History  Diagnosis Date  . Ovarian cyst   . Bronchitis   . Chronic headaches     Past Surgical History  Procedure Laterality Date  . Tubal ligation    . Appendectomy    . Ovarian cyst surgery      There were no vitals filed for this visit.  Visit Diagnosis:  Hamstring injury, right, initial encounter - Plan: PT plan of care cert/re-cert  Knee pain, right - Plan: PT plan of care cert/re-cert  Abnormality of gait - Plan: PT plan of care cert/re-cert  Poor posture - Plan: PT plan of care cert/re-cert  Impaired functional mobility, balance, gait, and endurance - Plan: PT plan of care cert/re-cert  Edema - Plan: PT plan of care cert/re-cert      Subjective Assessment - 06/12/14 1107    Subjective Knee is pulling and tight, painful during walking; if she walks too long she does get some swelling in the knee. Pain around 3/10 on average.    Pertinent History Was jogging uphill on a sidewalk, heard a pop. Went to work, where knee swelled up and she had increased. Saw MD, who saw it was a hamstring injury and took patient out of work for one month. Per MD note, MRI clear.    How long can you sit comfortably? 45 minutes    How long can you stand comfortably? Stands with weight shifted to L;  unlimited as long as she stands this way   How long can you walk comfortably? 30 minutes    Patient Stated Goals get back to work    Currently in Pain? Yes   Pain Score 3    Pain Location Knee   Pain Orientation Right            [facility] PT Assessment - 06/12/14 0001    Assessment   Medical Diagnosis R hamstring injury    Onset Date 05/08/14   Next MD Visit May 24th    Precautions   Precautions None   Precaution Comments knee brace   Restrictions   Weight Bearing Restrictions No   Balance Screen   Has the patient fallen in the past 6 months Yes   How many times? fell in shower twice last week    Has the patient had a decrease in activity level because of a fear of falling?  Yes   Is the patient reluctant to leave their home because of a fear of falling?  No   Prior Function   Level of Independence Independent with basic ADLs;Independent with gait;Independent with transfers   Vocation Full time employment   Armed forces training and education officer at [employer]    Observation/Other Assessments   Focus on Therapeutic Outcomes (FOTO)  58% limited  Posture/Postural Control   Posture Comments weight shifted to L, increased cervical lordosis, flat T spine, rounded shoulders, R knee flexion    AROM   Right Hip External Rotation  43   Right Hip Internal Rotation  25   Left Hip External Rotation  45   Left Hip Internal Rotation  34   Right Knee Extension 4   Right Knee Flexion 108   Right Ankle Dorsiflexion 14  pain limited   Left Ankle Dorsiflexion 18   Strength   Right Hip Flexion 3/5  pain limited    Right Hip Extension 3-/5   Right Hip ABduction 3-/5  pain limited    Left Hip Flexion 4+/5   Left Hip Extension 4+/5   Left Hip ABduction 4+/5   Right Knee Flexion 2+/5  pain limited    Right Knee Extension 3/5  pain limited    Left Knee Flexion 5/5   Left Knee Extension 5/5   Right Ankle Dorsiflexion 3-/5   Left Ankle Dorsiflexion 5/5   Ambulation/Gait   Gait Comments  reduced wt shift to R, knee flexion on R, reduced step length L, reduced stance time R, reduced TKE R, weak proximal musculature, hip ER greater R than L, reduced ankle mobility R greater than L                            PT Education - 06/12/14 1206    Education provided Yes   Education Details prognosis, plan of care moving forward, HEP with instructions to remain within pain limits; encouraged patient to use cane or crutch during gait to reduce stress on injured hamstring    Person(s) Educated Patient   Methods Explanation;Demonstration;Handout   Comprehension Verbalized understanding;Returned demonstration;Need further instruction          PT Short Term Goals - 06/12/14 1212    PT SHORT TERM GOAL #1   Title Patient will complain of pain no more than 1/10 pain at rest or 3/10 pain during exercise    Time 3   Period Weeks   Status New   PT SHORT TERM GOAL #2   Title Patient will demonstrate R knee extension to 0 degrees and R knee flexion to 120 degrees with pain no more than 1/10   Time 3   Period Weeks   Status New   PT SHORT TERM GOAL #3   Title Patient will demonstrate R lower extremity strength of at least 3+/5 to 4-/5 with pain no more than 1/10    Time 3   Period Weeks   Status New   PT SHORT TERM GOAL #4   Title Patient will demonstrate bilateral hip internal rotation of at least 40 degrees    Time 3   Period Weeks   Status New   PT SHORT TERM GOAL #5   Title Patient will demonstrate the ability to consistently safely and efficiently ambulate community distances with single point cane, pain no more than 1/10, and appropriate sequencing strategy with device    Time 3   Period Weeks   Status New           PT Long Term Goals - 06/12/14 1212    PT LONG TERM GOAL #1   Title Patient will be independent in appropriate HEP, to be advanced as needed, with correct form and on consistent basis    Time 6   Period Weeks   Status New   PT  LONG TERM  GOAL #2   Title Patient will be able to ambulate unlimited distances with no assistive device, pain 0/10,  with equal weight bearing, improved stance time right leg, improved step length left leg, no extensor lag R LE, and good balance during sharp turns    Time 6   Period Weeks   Status New   PT LONG TERM GOAL #3   Title Patient to report that she has been able to return to light jogging on soft surfaces for at least 20 minute bouts 2-3 times per week and pain 0/10   Time 6   Period Weeks   Status New   PT LONG TERM GOAL #4   Title Patient to report that 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 with pain 0/10 and no edema noted in R LE    Time 6   Period Weeks   Status New   PT LONG TERM GOAL #5   Title Patient will demonstrate R LE and proximal muscle strength of at least 4/5 to 4+/5    Time 6   Period Weeks   Status New               Plan - 06/12/14 1212    Clinical Impression Statement Patient presents status-post R hamstring injury with general R leg and proximal muscle weakness, impaired R knee and bilateral hip/ankle range of motion, R knee pain, postural and gait deviations, R knee edema primarily over medial hamstrings, reduced functional activity tolerance, and reduced ability to return to efficient functional task performance. The patient states that MRIs were done on her R leg and came back clear; confirmed via MD note on EPIC. The patient also states that while her MD has recommended that she use an assistive device (crutch or cane), she has not been since she feels embarrassed to use the device in public; strongly encouraged patient to use assisstive device in order to reduce stress on injured hamstring and demonstrated/had patient practice proper use of device. Patient will benefit from skilled PT services in order to address these impairments, facilitate healing process of the hamstring, and assist her in returning to an optimal  level of function.   Pt will benefit from skilled therapeutic intervention in order to improve on the following deficits Abnormal gait;Decreased coordination;Decreased range of motion;Difficulty walking;Impaired flexibility;Improper body mechanics;Decreased endurance;Decreased safety awareness;Increased edema;Postural dysfunction;Decreased activity tolerance;Decreased balance;Increased muscle spasms;Pain;Decreased mobility;Decreased strength   Rehab Potential Good   PT Frequency 3x / week   PT Duration 6 weeks   PT Treatment/Interventions ADLs/Self Care Home Management;Gait training;Neuromuscular re-education;Ultrasound;Stair training;Biofeedback;Functional mobility training;Patient/family education;Passive range of motion;Cryotherapy;Therapeutic activities;Electrical Stimulation;Therapeutic exercise;Manual techniques;Moist Heat;DME Instruction;Balance training   PT Next Visit Plan review goals from initial eval; functional stretching and strengthening within pain limits; do hamstring strength gravity eliminated at this time due to pain, no bands or weights until pain reduces    PT Home Exercise Plan given    Consulted and Agree with Plan of Care Patient         Problem List There are no active problems to display for this patient.   Deniece Ree PT, DPT [phone]  [facility] [address] [place], [name], [address] Phone: [phone]   Fax:  [phone]

## 2014-06-18 ENCOUNTER — Ambulatory Visit (INDEPENDENT_AMBULATORY_CARE_PROVIDER_SITE_OTHER): Payer: 59 | Admitting: Orthopedic Surgery

## 2014-06-18 ENCOUNTER — Encounter: Payer: Self-pay | Admitting: Orthopedic Surgery

## 2014-06-18 VITALS — BP 107/76 | Ht 69.0 in | Wt 145.0 lb

## 2014-06-18 DIAGNOSIS — S76301D Unspecified injury of muscle, fascia and tendon of the posterior muscle group at thigh level, right thigh, subsequent encounter: Secondary | ICD-10-CM | POA: Diagnosis not present

## 2014-06-18 NOTE — Patient Instructions (Addendum)
WORK NOTE 4 WEEKS

## 2014-06-18 NOTE — Progress Notes (Signed)
Chief Complaint  Patient presents with  . Follow-up    4 week follow up right knee, DOI 05/08/14    Recheck. Right hamstring injury. MRI showed no tear  Review of systems other than the giving way no numbness or tingling in the right leg  She got one visit. At therapy she has no improvement in terms of symptoms her knee still gives way she size pain on the medial hamstring  Expanded exam. Vital signs are stable. She is awake alert and oriented 3 mood and affect are normal she is ambulatory with a cane and a small knee brace. She has tenderness over the medial hamstrings her ligaments are stable neurovascular exam is intact  Recommend continued physical therapy continue out of work 4 weeks follow-up 4 weeks continue weightbearing as tolerated in the brace.

## 2014-06-25 ENCOUNTER — Ambulatory Visit (HOSPITAL_COMMUNITY): Payer: 59

## 2014-06-25 DIAGNOSIS — R609 Edema, unspecified: Secondary | ICD-10-CM

## 2014-06-25 DIAGNOSIS — M25561 Pain in right knee: Secondary | ICD-10-CM

## 2014-06-25 DIAGNOSIS — R293 Abnormal posture: Secondary | ICD-10-CM

## 2014-06-25 DIAGNOSIS — S76301D Unspecified injury of muscle, fascia and tendon of the posterior muscle group at thigh level, right thigh, subsequent encounter: Secondary | ICD-10-CM | POA: Diagnosis not present

## 2014-06-25 DIAGNOSIS — Z7409 Other reduced mobility: Secondary | ICD-10-CM

## 2014-06-25 DIAGNOSIS — S76301A Unspecified injury of muscle, fascia and tendon of the posterior muscle group at thigh level, right thigh, initial encounter: Secondary | ICD-10-CM

## 2014-06-25 DIAGNOSIS — R269 Unspecified abnormalities of gait and mobility: Secondary | ICD-10-CM

## 2014-06-25 NOTE — Therapy (Signed)
[place], [name], [address] Phone: [phone]   Fax:  [phone]  Physical Therapy Treatment  Patient Details  Name: Lisa Fields MRN: [id number] Date of Birth: [birth year] Referring Provider:  Carole Civil, MD  Encounter Date: 06/25/2014      PT End of Session - 06/25/14 1413    Visit Number 2   Number of Visits 18   Date for PT Re-Evaluation 07/10/14   Authorization Type UMR    Authorization Time Period 06/12/14 to 07/181/6   Authorization - Visit Number 2   Authorization - Number of Visits 24   PT Start Time 1324   PT Stop Time 1430   PT Time Calculation (min) 48 min   Activity Tolerance Patient tolerated treatment well   Behavior During Therapy [facility] for tasks assessed/performed      Past Medical History  Diagnosis Date  . Ovarian cyst   . Bronchitis   . Chronic headaches     Past Surgical History  Procedure Laterality Date  . Tubal ligation    . Appendectomy    . Ovarian cyst surgery      There were no vitals filed for this visit.  Visit Diagnosis:  Hamstring injury, right, initial encounter  Knee pain, right  Abnormality of gait  Poor posture  Impaired functional mobility, balance, gait, and endurance  Edema      Subjective Assessment - 06/25/14 1334    Subjective Pt entered dept ambulating with SPC stated more pain with walking and numbness   Pertinent History Was jogging uphill on a sidewalk, heard a pop. Went to work, where knee swelled up and she had increased. Saw MD, who saw it was a hamstring injury and took patient out of work for one month. Per MD note, MRI clear.    Currently in Pain? Yes   Pain Score 4    Pain Location Knee   Pain Orientation Right;Anterior   Pain Descriptors / Indicators Aching            [facility] PT Assessment - 06/25/14 0001    Assessment   Medical Diagnosis R hamstring injury    Onset Date/Surgical Date 05/08/14   Next MD  Visit Aline Brochure 07/16/2014              [facility] Adult PT Treatment/Exercise - 06/25/14 0001    Exercises   Exercises Knee/Hip   Knee/Hip Exercises: Stretches   Active Hamstring Stretch 3 reps;30 seconds   Active Hamstring Stretch Limitations supine with rope   Quad Stretch 3 reps;30 seconds   Quad Stretch Limitations prone with rope   Piriformis Stretch 3 reps;30 seconds   Piriformis Stretch Limitations figure 4 supine with towel   Knee/Hip Exercises: Standing   Gait Training Gait training 5RT around dept. with cueing for equalized stride length and sequence with SPC 3 point then 2 point step through   Knee/Hip Exercises: Supine   Quad Sets Right;10 reps   Short Arc Quad Sets Right;10 reps   Heel Slides Right;10 reps   Straight Leg Raises Both;10 reps   Knee/Hip Exercises: Sidelying   Hip ABduction Both;10 reps   Knee/Hip Exercises: Prone   Hip Extension Both;10 reps                  PT Short Term Goals - 06/25/14 1513    PT SHORT TERM GOAL #1   Title Patient will complain of pain no more than 1/10 pain  at rest or 3/10 pain during exercise    Status On-going   PT SHORT TERM GOAL #2   Title Patient will demonstrate R knee extension to 0 degrees and R knee flexion to 120 degrees with pain no more than 1/10   Status On-going   PT SHORT TERM GOAL #3   Title Patient will demonstrate R lower extremity strength of at least 3+/5 to 4-/5 with pain no more than 1/10    Status On-going   PT SHORT TERM GOAL #4   Title Patient will demonstrate bilateral hip internal rotation of at least 40 degrees    Status On-going   PT SHORT TERM GOAL #5   Title Patient will demonstrate the ability to consistently safely and efficiently ambulate community distances with single point cane, pain no more than 1/10, and appropriate sequencing strategy with device    Status On-going           PT Long Term Goals - 06/25/14 1513    PT LONG TERM GOAL #1   Title Patient will be independent  in appropriate HEP, to be advanced as needed, with correct form and on consistent basis    PT LONG TERM GOAL #2   Title Patient will be able to ambulate unlimited distances with no assistive device, pain 0/10,  with equal weight bearing, improved stance time right leg, improved step length left leg, no extensor lag R LE, and good balance during sharp turns    PT LONG TERM GOAL #3   Title Patient to report that she has been able to return to light jogging on soft surfaces for at least 20 minute bouts 2-3 times per week and pain 0/10   PT LONG TERM GOAL #4   Title Patient to report that 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  Began session with gait training for proper mechanics with SPC, initially 3 point step then 2 point sequence with cueing for proper sequencing and to equalize stride length for more normalize gait mechanics.  Session focus on hip and knee strengtheninig exercises with open chain kinetic exercises.  Added stretches to improve flexibilty.  No reports of increased pain through session.  Pt encouraged to apply ice with elevation  for pain and edema control.     PT Next Visit Plan Functional stretching and strengthening within pain limits; do hamstring strength gravity eliminated at this time due to pain, no bands or weights until pain reduces         Problem List There are no active problems to display for this patient.  [name], PTA  [name] 06/25/2014, 3:14 PM  [facility] [address] [place], [name], [address] Phone:  [phone]   Fax:  [phone]

## 2014-06-26 ENCOUNTER — Ambulatory Visit (HOSPITAL_COMMUNITY): Payer: 59 | Attending: Orthopedic Surgery | Admitting: Physical Therapy

## 2014-06-26 DIAGNOSIS — R269 Unspecified abnormalities of gait and mobility: Secondary | ICD-10-CM | POA: Insufficient documentation

## 2014-06-26 DIAGNOSIS — R609 Edema, unspecified: Secondary | ICD-10-CM | POA: Insufficient documentation

## 2014-06-26 DIAGNOSIS — S76301D Unspecified injury of muscle, fascia and tendon of the posterior muscle group at thigh level, right thigh, subsequent encounter: Secondary | ICD-10-CM | POA: Insufficient documentation

## 2014-06-26 DIAGNOSIS — M25561 Pain in right knee: Secondary | ICD-10-CM | POA: Insufficient documentation

## 2014-06-26 DIAGNOSIS — R293 Abnormal posture: Secondary | ICD-10-CM | POA: Insufficient documentation

## 2014-06-28 ENCOUNTER — Telehealth (HOSPITAL_COMMUNITY): Payer: Self-pay

## 2014-06-28 ENCOUNTER — Ambulatory Visit (HOSPITAL_COMMUNITY): Payer: 59

## 2014-07-03 ENCOUNTER — Ambulatory Visit (HOSPITAL_COMMUNITY): Payer: 59 | Admitting: Physical Therapy

## 2014-07-03 DIAGNOSIS — R269 Unspecified abnormalities of gait and mobility: Secondary | ICD-10-CM

## 2014-07-03 DIAGNOSIS — S76301D Unspecified injury of muscle, fascia and tendon of the posterior muscle group at thigh level, right thigh, subsequent encounter: Secondary | ICD-10-CM | POA: Diagnosis not present

## 2014-07-03 DIAGNOSIS — R293 Abnormal posture: Secondary | ICD-10-CM | POA: Diagnosis not present

## 2014-07-03 DIAGNOSIS — M25561 Pain in right knee: Secondary | ICD-10-CM | POA: Diagnosis not present

## 2014-07-03 DIAGNOSIS — S76301A Unspecified injury of muscle, fascia and tendon of the posterior muscle group at thigh level, right thigh, initial encounter: Secondary | ICD-10-CM

## 2014-07-03 DIAGNOSIS — Z7409 Other reduced mobility: Secondary | ICD-10-CM

## 2014-07-03 DIAGNOSIS — R609 Edema, unspecified: Secondary | ICD-10-CM

## 2014-07-03 NOTE — Therapy (Signed)
[facility], [name], [address] Phone: [phone]   Fax:  [phone]  Physical Therapy Treatment  Patient Details  Name: Lisa Fields MRN: [id number] Date of Birth: [birth year] Referring Provider:  Carole Civil, MD  Encounter Date: 07/03/2014      PT End of Session - 07/03/14 1204    Visit Number 3   Number of Visits 18   Date for PT Re-Evaluation 07/10/14   Authorization Type UMR    Authorization Time Period 06/12/14 to 07/181/6   Authorization - Visit Number 3   Authorization - Number of Visits 24   PT Start Time 9794   PT Stop Time 1148   PT Time Calculation (min) 45 min   Activity Tolerance Patient tolerated treatment well   Behavior During Therapy [facility] for tasks assessed/performed      Past Medical History  Diagnosis Date  . Ovarian cyst   . Bronchitis   . Chronic headaches     Past Surgical History  Procedure Laterality Date  . Tubal ligation    . Appendectomy    . Ovarian cyst surgery      There were no vitals filed for this visit.  Visit Diagnosis:  Hamstring injury, right, initial encounter  Knee pain, right  Abnormality of gait  Poor posture  Impaired functional mobility, balance, gait, and endurance  Edema      Subjective Assessment - 07/03/14 1107    Subjective Patient entered dept without SPC, states she actually felt good enough to go for a run with a knee brace the other day without her brace, felt great afterwards    Pertinent History Was jogging uphill on a sidewalk, heard a pop. Went to work, where knee swelled up and she had increased. Saw MD, who saw it was a hamstring injury and took patient out of work for one month. Per MD note, MRI clear.    Currently in Pain? No/denies                         [facility] Adult PT Treatment/Exercise - 07/03/14 1118    Knee/Hip Exercises: Stretches   Active Hamstring Stretch 3 reps;30 seconds   Active  Hamstring Stretch Limitations 12 inch box    Quad Stretch 3 reps;30 seconds   Quad Stretch Limitations prone with rope   Piriformis Stretch 3 reps;30 seconds   Piriformis Stretch Limitations seated    Gastroc Stretch 3 reps;30 seconds   Gastroc Stretch Limitations slantboard    Knee/Hip Exercises: Aerobic   Elliptical 5 minutes level 1   Knee/Hip Exercises: Standing   Heel Raises 10 reps   Heel Raises Limitations toeraises 10 reps   Forward Lunges Both;10 reps;Limitations   Forward Lunges Limitations onto 4" step   Functional Squat 10 reps   Knee/Hip Exercises: Supine   Bridges 15 reps   Straight Leg Raises Both;15 reps   Knee/Hip Exercises: Sidelying   Hip ABduction Both;15 reps   Knee/Hip Exercises: Prone   Hamstring Curl 15 reps   Hip Extension Both;15 reps                  PT Short Term Goals - 06/25/14 1513    PT SHORT TERM GOAL #1   Title Patient will complain of pain no more than 1/10 pain at rest or 3/10 pain during exercise    Status On-going   PT SHORT TERM GOAL #2  Title Patient will demonstrate R knee extension to 0 degrees and R knee flexion to 120 degrees with pain no more than 1/10   Status On-going   PT SHORT TERM GOAL #3   Title Patient will demonstrate R lower extremity strength of at least 3+/5 to 4-/5 with pain no more than 1/10    Status On-going   PT SHORT TERM GOAL #4   Title Patient will demonstrate bilateral hip internal rotation of at least 40 degrees    Status On-going   PT SHORT TERM GOAL #5   Title Patient will demonstrate the ability to consistently safely and efficiently ambulate community distances with single point cane, pain no more than 1/10, and appropriate sequencing strategy with device    Status On-going           PT Long Term Goals - 06/25/14 1513    PT LONG TERM GOAL #1   Title Patient will be independent in appropriate HEP, to be advanced as needed, with correct form and on consistent basis    PT LONG TERM GOAL #2    Title Patient will be able to ambulate unlimited distances with no assistive device, pain 0/10,  with equal weight bearing, improved stance time right leg, improved step length left leg, no extensor lag R LE, and good balance during sharp turns    PT LONG TERM GOAL #3   Title Patient to report that she has been able to return to light jogging on soft surfaces for at least 20 minute bouts 2-3 times per week and pain 0/10   PT LONG TERM GOAL #4   Title Patient to report that 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  Progressing well with return to activites, however doing all while wearing brace.  Advanced to standing hamstring stretch, squats, lunges, heelraises and elliptical today.  No difficutly or deficits with any activites.  Pt returns to MD in 2 weeks.     PT Next Visit Plan Progress patient as able to complete all exercises without pain.  continue with brace until MD discontinues.         Problem List There are no active problems to display for this patient.   Teena Irani, PTA/CLT [phone]  07/03/2014, 12:10 PM  [facility] [address] [place], [name], [address] Phone: [phone]   Fax:  [phone]

## 2014-07-05 ENCOUNTER — Ambulatory Visit (HOSPITAL_COMMUNITY): Payer: 59 | Admitting: Physical Therapy

## 2014-07-05 ENCOUNTER — Telehealth (HOSPITAL_COMMUNITY): Payer: Self-pay | Admitting: Physical Therapy

## 2014-07-05 NOTE — Telephone Encounter (Signed)
Patient did not show for her 1PM appointment today. Called home number and left message reminding patient of date and time of next appointment on Monday, June 13th at [facility], DPT [phone]

## 2014-07-08 ENCOUNTER — Ambulatory Visit (HOSPITAL_COMMUNITY): Payer: 59 | Admitting: Physical Therapy

## 2014-07-08 DIAGNOSIS — R269 Unspecified abnormalities of gait and mobility: Secondary | ICD-10-CM

## 2014-07-08 DIAGNOSIS — S76301D Unspecified injury of muscle, fascia and tendon of the posterior muscle group at thigh level, right thigh, subsequent encounter: Secondary | ICD-10-CM | POA: Diagnosis not present

## 2014-07-08 DIAGNOSIS — S76301A Unspecified injury of muscle, fascia and tendon of the posterior muscle group at thigh level, right thigh, initial encounter: Secondary | ICD-10-CM

## 2014-07-08 DIAGNOSIS — M25561 Pain in right knee: Secondary | ICD-10-CM

## 2014-07-08 NOTE — Patient Instructions (Signed)
Stretching: Hamstring (Supine)   Supporting right thigh behind knee, slowly straighten knee until stretch is felt in back of thigh. Hold ___30_ seconds. Repeat __3__ times per set. Do ___1_ sets per session. Do _2___ sessions per day.  http://orth.exer.us/656   Copyright  VHI. All rights reserved.  Heel Raise: Unilateral (Standing)   Balance on left foot, then rise on ball of foot. Repeat __15__ times per set. Do ___1_ sets per session. Do __2__ sessions per day.  http://orth.exer.us/40   Copyright  VHI. All rights reserved.  Quad Strength: Single-Leg Quarter Squat   Standing on involved leg with back against wall, slide down wall until knee is at 30-45. Return. Repeat __15__ times . Do __1-2__ sessions per day. CAUTION: You should not bend knee deep enough to cause pain.  http://cc.exer.us/11   Copyright  VHI. All rights reserved.

## 2014-07-08 NOTE — Therapy (Signed)
[facility] [place], [name], [address] Phone: [phone]   Fax:  [phone]  Physical Therapy Treatment/reassessment  Patient Details  Name: Lisa Fields MRN: [id number] Date of Birth: 10-17-1980 Referring Provider:  Carole Civil, MD  Encounter Date: 07/08/2014      PT End of Session - 07/08/14 1444    Visit Number 4   Number of Visits 4   Date for PT Re-Evaluation 07/10/14   Authorization Type UMR    Authorization Time Period 06/12/14 to 07/181/6   PT Start Time 1104   PT Stop Time 1149   PT Time Calculation (min) 45 min   Activity Tolerance Patient tolerated treatment well      Past Medical History  Diagnosis Date  . Ovarian cyst   . Bronchitis   . Chronic headaches     Past Surgical History  Procedure Laterality Date  . Tubal ligation    . Appendectomy    . Ovarian cyst surgery      There were no vitals filed for this visit.  Visit Diagnosis:  Hamstring injury, right, initial encounter  Knee pain, right  Abnormality of gait      Subjective Assessment - 07/08/14 1107    Subjective Pt is no longer using a cane but she still uses her brace.  She states if she takes the brace off she has increased pain.     How long can you sit comfortably? able to sit for 2 hours now was 45 minutes    How long can you stand comfortably? Pt is able to stand with equal weight bearing now was standing with weight on her Lt leg.    How long can you walk comfortably? 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 No/denies   Pain Score --  higherst pain in the past week is a 1.    Pain Location Knee   Pain Orientation Right;Medial            [facility] PT Assessment - 07/08/14 0001    Assessment   Medical Diagnosis R hamstring injury    Onset Date/Surgical Date 05/08/14   Next MD Visit Aline Brochure 07/16/2014   Observation/Other Assessments   Focus on Therapeutic Outcomes (FOTO)  20% limitation was 58%   Functional Tests   Functional tests Single leg stance;Sit to Stand   Single Leg Stance   Comments Lt 12 second; Rt 60 sec    Sit to Stand   Comments 10 in 30 seconds    Strength   Right Hip Flexion 5/5  was 3/5    Right Hip Extension 5/5  was 3-/5    Right Hip ABduction 5/5  was 3-/5   Right Knee Flexion 5/5  was 2+/5   Right Knee Extension 5/5  was 5/5   Right Ankle Dorsiflexion --  5-/5 was 3-/5   Flexibility   Soft Tissue Assessment /Muscle Length yes   Hamstrings = B   Quadriceps Lt tighter than Rt               [facility] Adult PT Treatment/Exercise - 07/08/14 0001    Knee/Hip Exercises: Stretches   Active Hamstring Stretch  3 reps;30 seconds   Knee/Hip Exercises: Aerobic   Elliptical 10:00   Knee/Hip Exercises: Standing   Heel Raises 15 reps   Heel Raises Limitations Rt only   Other Standing Knee Exercises Rt minisquat                 PT Education - 07/08/14 1444    Education provided Yes   Education Details New HEP; instruction on how to wean from brace    Person(s) Educated Patient   Methods Explanation;Handout   Comprehension Verbalized understanding;Returned demonstration          PT Short Term Goals - 07/08/14 1127    PT SHORT TERM GOAL #1   Title Patient will complain of pain no more than 1/10 pain at rest or 3/10 pain during exercise    Time 3   Period Weeks   Status Achieved   PT SHORT TERM GOAL #2   Title Patient will demonstrate R knee extension to 0 degrees and R knee flexion to 120 degrees with pain no more than 1/10   Time 3   Period Weeks   Status Achieved   PT SHORT TERM GOAL #3   Title Patient will demonstrate R lower extremity strength of at least 3+/5 to 4-/5 with pain no more than 1/10    Time 3   Period Weeks    Status Achieved   PT SHORT TERM GOAL #4   Title Patient will demonstrate bilateral hip internal rotation of at least 40 degrees    Time 3   Period Weeks   Status Achieved   PT SHORT TERM GOAL #5   Title Patient will demonstrate the ability to consistently safely and efficiently ambulate community distances with single point cane, pain no more than 1/10, and appropriate sequencing strategy with device    Time 3   Period Weeks   Status Achieved           PT Long Term Goals - 07/08/14 1128    PT LONG TERM GOAL #1   Title Patient will be independent in appropriate HEP, to be advanced as needed, with correct form and on consistent basis    Time 6   Period Weeks   Status Achieved   PT LONG TERM GOAL #2   Title Patient will be able to ambulate unlimited distances with no assistive device, pain 0/10,  with equal weight bearing, improved stance time right leg, improved step length left leg, no extensor lag R LE, and good balance during sharp turns    Time 6   Period Weeks   Status On-going   PT LONG TERM GOAL #3   Title Patient to report that she has been able to return to light jogging on soft surfaces for at least 20 minute bouts 2-3 times per week and pain 0/10   Time 6   Period Weeks   Status On-going   PT LONG TERM GOAL #4   Title Patient to report that 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 Sieloff has been completing HEP and is eager to get back to the [facility].  Examination today demonstrates normal ROM, strength and balance.  Pt will be discharged to HEP.  Encouraged pt to start weaning away from her knee brace.    PT  Next Visit Plan D/C        Problem List There are no active problems to display for this patient.  Rayetta Humphrey, PT CLT [phone] 07/08/2014, 2:49 PM  [place] [address] [place], [name], [address] Phone: [phone]   Fax:  [phone]  PHYSICAL THERAPY DISCHARGE SUMMARY  Plan: Patient agrees to discharge.  Patient goals were partially met. Patient is being discharged due to being pleased with the current functional level.  ?????      Rayetta Humphrey, [place] CLT [phone]

## 2014-07-10 ENCOUNTER — Encounter (HOSPITAL_COMMUNITY): Payer: Self-pay | Admitting: Physical Therapy

## 2014-07-12 ENCOUNTER — Encounter (HOSPITAL_COMMUNITY): Payer: Self-pay

## 2014-07-15 ENCOUNTER — Encounter (HOSPITAL_COMMUNITY): Payer: Self-pay | Admitting: Physical Therapy

## 2014-07-16 ENCOUNTER — Ambulatory Visit (INDEPENDENT_AMBULATORY_CARE_PROVIDER_SITE_OTHER): Payer: 59 | Admitting: Orthopedic Surgery

## 2014-07-16 ENCOUNTER — Encounter: Payer: Self-pay | Admitting: Orthopedic Surgery

## 2014-07-16 VITALS — BP 124/81 | Ht 69.0 in | Wt 145.0 lb

## 2014-07-16 DIAGNOSIS — S76301D Unspecified injury of muscle, fascia and tendon of the posterior muscle group at thigh level, right thigh, subsequent encounter: Secondary | ICD-10-CM | POA: Diagnosis not present

## 2014-07-16 NOTE — Progress Notes (Signed)
Patient ID: Lisa Fields, female   DOB: November 24, 1980, 34 y.o.   MRN: [id number] Chief Complaint  Patient presents with  . Follow-up    4 week follow up right knee/hamstring    She has completed all of her physical therapy for her right hamstring injury she had an MRI showed no evidence of intra-articular otology  Review of systems she continues to have giving way symptoms of her right leg. She has intermittent back pain but nothing more than what she usually has  BP 124/81 mmHg  Ht 5\' 9"  (1.753 m)  Wt 145 lb (65.772 kg)  BMI 21.40 kg/m2 She is walking with a knee brace she removed for examination she had no swelling she had full range of motion she had normal stability in all the ligaments her motor exam showed no atrophy good quadriceps strength good straight leg raise normal terminal knee extension neurovascular exam was intact  Encounter Diagnosis  Name Primary?  . Hamstring injury, right, subsequent encounter Yes     Recommend continue brace Follow-up one month Return to work on July 11

## 2014-07-17 ENCOUNTER — Encounter (HOSPITAL_COMMUNITY): Payer: Self-pay | Admitting: Physical Therapy

## 2014-07-19 ENCOUNTER — Encounter (HOSPITAL_COMMUNITY): Payer: Self-pay | Admitting: Physical Therapy

## 2014-07-22 ENCOUNTER — Encounter (HOSPITAL_COMMUNITY): Payer: Self-pay | Admitting: Physical Therapy

## 2014-07-24 ENCOUNTER — Encounter (HOSPITAL_COMMUNITY): Payer: Self-pay | Admitting: Physical Therapy

## 2014-07-26 ENCOUNTER — Encounter (HOSPITAL_COMMUNITY): Payer: Self-pay | Admitting: Physical Therapy

## 2014-08-13 ENCOUNTER — Ambulatory Visit (INDEPENDENT_AMBULATORY_CARE_PROVIDER_SITE_OTHER): Payer: 59 | Admitting: Orthopedic Surgery

## 2014-08-13 VITALS — BP 101/66 | Ht 69.0 in | Wt 145.0 lb

## 2014-08-13 DIAGNOSIS — S76301D Unspecified injury of muscle, fascia and tendon of the posterior muscle group at thigh level, right thigh, subsequent encounter: Secondary | ICD-10-CM | POA: Diagnosis not present

## 2014-08-13 NOTE — Progress Notes (Signed)
Patient ID: Lisa Fields, female   DOB: 12-06-1980, 34 y.o.   MRN: [id number]  Follow up visit  Chief Complaint  Patient presents with  . Follow-up    4 week recheck right hamstring,DOI 05/08/14    BP 101/66 mmHg  Ht 5\' 9"  (1.753 m)  Wt 145 lb (65.772 kg)  BMI 21.40 kg/m2  Encounter Diagnosis  Name Primary?  . Hamstring injury, right, subsequent encounter Yes    Follow-up regarding her right hamstring injury she's now complaining of bilateral leg cramping at night.  She will take this up with her primary care doctor to address her potassium, calcium, vitamin D, magnesium.  System review otherwise normal  Exam shows full range of motion without swelling or tenderness the knee is stable motor exam is normal neurovascular exam is intact. Vitals are stable as noted  Patient is discharged to come back on an as-needed basis

## 2014-08-26 ENCOUNTER — Encounter: Payer: Self-pay | Admitting: Nurse Practitioner

## 2014-08-26 ENCOUNTER — Ambulatory Visit (INDEPENDENT_AMBULATORY_CARE_PROVIDER_SITE_OTHER): Payer: 59 | Admitting: Nurse Practitioner

## 2014-08-26 VITALS — BP 110/72 | Temp 98.3°F | Ht 69.0 in | Wt 155.0 lb

## 2014-08-26 DIAGNOSIS — F329 Major depressive disorder, single episode, unspecified: Secondary | ICD-10-CM

## 2014-08-26 DIAGNOSIS — J329 Chronic sinusitis, unspecified: Secondary | ICD-10-CM

## 2014-08-26 DIAGNOSIS — F32A Depression, unspecified: Secondary | ICD-10-CM

## 2014-08-26 MED ORDER — AZITHROMYCIN 250 MG PO TABS: ORAL_TABLET | ORAL | Status: DC

## 2014-08-26 MED ORDER — CITALOPRAM HYDROBROMIDE 20 MG PO TABS: 20.0000 mg | ORAL_TABLET | Freq: Every day | ORAL | Status: DC

## 2014-08-26 MED ORDER — METHYLPREDNISOLONE ACETATE 40 MG/ML IJ SUSP
40.0000 mg | Freq: Once | INTRAMUSCULAR | Status: AC
  Administered 2014-08-26: 40 mg via INTRAMUSCULAR

## 2014-08-27 ENCOUNTER — Encounter: Payer: Self-pay | Admitting: Nurse Practitioner

## 2014-08-27 DIAGNOSIS — F32A Depression, unspecified: Secondary | ICD-10-CM | POA: Insufficient documentation

## 2014-08-27 DIAGNOSIS — F329 Major depressive disorder, single episode, unspecified: Secondary | ICD-10-CM | POA: Insufficient documentation

## 2014-08-27 NOTE — Progress Notes (Signed)
Subjective:  Presents complaints of sinus symptoms over the past week. Low-grade fever. Sore throat. Frontal area headache. Green nasal drainage. No cough. Frequent sneezing. No ear pain. No wheezing. Also patient wishes to increase her Celexa dose which she is taking for depression. Symptoms are fairly stable but would like to see some more improvement. Denies any adverse affects.  Objective:   BP 110/72 mmHg  Temp(Src) 98.3 F (36.8 C) (Oral)  Ht 5\' 9"  (1.753 m)  Wt 155 lb (70.308 kg)  BMI 22.88 kg/m2 NAD. Alert, oriented. TMs significant clear effusion, no erythema. Pharynx injected with green PND noted. Neck supple with mild soft anterior adenopathy. Lungs clear. Heart regular rate rhythm. Thoughts logical coherent and relevant. Calm affect.  Assessment:  Problem List Items Addressed This Visit      Other   Depression   Relevant Medications   citalopram (CELEXA) 20 MG tablet    Other Visit Diagnoses    Rhinosinusitis    -  Primary    Relevant Medications    azithromycin (ZITHROMAX Z-PAK) 250 MG tablet    methylPREDNISolone acetate (DEPO-MEDROL) injection 40 mg (Completed)      Plan:  Meds ordered this encounter  Medications  . azithromycin (ZITHROMAX Z-PAK) 250 MG tablet    Sig: Take 2 tablets (500 mg) on  Day 1,  followed by 1 tablet (250 mg) once daily on Days 2 through 5.    Dispense:  6 each    Refill:  0    Order Specific Question:  Supervising Provider    Answer:  Mikey Kirschner [[id number]]  . citalopram (CELEXA) 20 MG tablet    Sig: Take 1 tablet (20 mg total) by mouth daily.    Dispense:  90 tablet    Refill:  1    Order Specific Question:  Supervising Provider    Answer:  Mikey Kirschner [[id number]]  . methylPREDNISolone acetate (DEPO-MEDROL) injection 40 mg    Sig:     If any problems with new dosage Celexa, patient to cut dose in half back to 10 mg and call our office. OTC meds as directed for congestion. Continue Flonase as directed. Call back if symptoms  worsen or persist. Otherwise recheck in 3-4 months for depression.

## 2014-08-30 ENCOUNTER — Telehealth: Payer: Self-pay | Admitting: Family Medicine

## 2014-08-30 MED ORDER — PREDNISONE 20 MG PO TABS: ORAL_TABLET | ORAL | Status: DC

## 2014-08-30 NOTE — Telephone Encounter (Signed)
Med sent to pharmacy. Patient was notified.  

## 2014-08-30 NOTE — Telephone Encounter (Signed)
Pt is requesting prednisone to be called in was seen by Hoyle Sauer this week and was going to be given it then but refused it. Pt thinks she now needs it due to not getting better  [facility] 

## 2014-08-30 NOTE — Telephone Encounter (Signed)
Patient dx with sinus infection and given depo medrol shot and z pack

## 2014-08-30 NOTE — Telephone Encounter (Signed)
Short course of prednisone 20 mg tablets, 3 tablets daily for 2 days, 2 tablets daily for 2 days, 1 tablet daily for 2 days, #12, follow-up if ongoing trouble

## 2014-09-04 ENCOUNTER — Ambulatory Visit (INDEPENDENT_AMBULATORY_CARE_PROVIDER_SITE_OTHER): Payer: 59 | Admitting: Nurse Practitioner

## 2014-09-04 ENCOUNTER — Encounter: Payer: Self-pay | Admitting: Nurse Practitioner

## 2014-09-04 VITALS — BP 124/76 | Temp 98.8°F | Ht 69.0 in | Wt 152.4 lb

## 2014-09-04 DIAGNOSIS — J208 Acute bronchitis due to other specified organisms: Secondary | ICD-10-CM

## 2014-09-04 DIAGNOSIS — J019 Acute sinusitis, unspecified: Secondary | ICD-10-CM | POA: Diagnosis not present

## 2014-09-04 DIAGNOSIS — B9689 Other specified bacterial agents as the cause of diseases classified elsewhere: Secondary | ICD-10-CM

## 2014-09-04 DIAGNOSIS — J00 Acute nasopharyngitis [common cold]: Secondary | ICD-10-CM | POA: Diagnosis not present

## 2014-09-04 MED ORDER — AMOXICILLIN-POT CLAVULANATE 875-125 MG PO TABS: 1.0000 | ORAL_TABLET | Freq: Two times a day (BID) | ORAL | Status: DC

## 2014-09-04 MED ORDER — PREDNISONE 20 MG PO TABS: ORAL_TABLET | ORAL | Status: DC

## 2014-09-04 MED ORDER — METHYLPREDNISOLONE ACETATE 40 MG/ML IJ SUSP
40.0000 mg | Freq: Once | INTRAMUSCULAR | Status: AC
  Administered 2014-09-04: 40 mg via INTRAMUSCULAR

## 2014-09-09 ENCOUNTER — Encounter: Payer: Self-pay | Admitting: Nurse Practitioner

## 2014-09-09 NOTE — Progress Notes (Signed)
Subjective:  Presents for complaints of congestion cough for the past 2 weeks, worse over the past 3 days. Recently completed a course of Zithromax see note 08/26/2014. Max temp 101. Sore throat. Frontal/facial area headache. Frequent cough producing thick green mucus. Slight wheezing only at nighttime. Ear pressure. Postnasal drainage. Has used her inhaler at night. Burning and chest pain with cough. No vomiting diarrhea or abdominal pain. No acid reflux or heartburn. Patient did not get prednisone as previously prescribed. Smoker.  Objective:   BP 124/76 mmHg  Temp(Src) 98.8 F (37.1 C) (Oral)  Ht 5\' 9"  (1.753 m)  Wt 152 lb 6 oz (69.117 kg)  BMI 22.49 kg/m2 NAD. Alert, oriented. TMs retracted bilateral, no erythema. Pharynx injected with green PND noted. Neck supple with mild soft anterior adenopathy. Lungs scattered faint crackles posterior, anterior clear. No wheezing or tachypnea. Normal color. Heart regular rate rhythm.  Assessment: Acute bacterial sinusitis - Plan: methylPREDNISolone acetate (DEPO-MEDROL) injection 40 mg  Acute bacterial bronchitis - Plan: methylPREDNISolone acetate (DEPO-MEDROL) injection 40 mg  Plan:  Meds ordered this encounter  Medications  . predniSONE (DELTASONE) 20 MG tablet    Sig: 3 po qd x 3 d then 2 po qd x 3 d then 1 po qd x 3 d    Dispense:  18 tablet    Refill:  0    Order Specific Question:  Supervising Provider    Answer:  Mikey Kirschner [[id number]]  . amoxicillin-clavulanate (AUGMENTIN) 875-125 MG per tablet    Sig: Take 1 tablet by mouth 2 (two) times daily.    Dispense:  20 tablet    Refill:  0    Order Specific Question:  Supervising Provider    Answer:  Mikey Kirschner [[id number]]  . methylPREDNISolone acetate (DEPO-MEDROL) injection 40 mg    Sig:    OTC meds as directed for congestion and cough. Discussed importance of smoking cessation. Warning signs reviewed. Call back in 5-7 days if no improvement, call or go to ED sooner if worse.

## 2014-10-16 ENCOUNTER — Encounter: Payer: Self-pay | Admitting: Nurse Practitioner

## 2014-10-16 ENCOUNTER — Ambulatory Visit (INDEPENDENT_AMBULATORY_CARE_PROVIDER_SITE_OTHER): Payer: 59 | Admitting: Nurse Practitioner

## 2014-10-16 VITALS — BP 108/70 | Ht 68.0 in | Wt 152.0 lb

## 2014-10-16 DIAGNOSIS — Z0000 Encounter for general adult medical examination without abnormal findings: Secondary | ICD-10-CM

## 2014-10-16 DIAGNOSIS — N921 Excessive and frequent menstruation with irregular cycle: Secondary | ICD-10-CM

## 2014-10-16 DIAGNOSIS — F32A Depression, unspecified: Secondary | ICD-10-CM

## 2014-10-16 DIAGNOSIS — Z124 Encounter for screening for malignant neoplasm of cervix: Secondary | ICD-10-CM

## 2014-10-16 DIAGNOSIS — Z1151 Encounter for screening for human papillomavirus (HPV): Secondary | ICD-10-CM | POA: Diagnosis not present

## 2014-10-16 DIAGNOSIS — R5383 Other fatigue: Secondary | ICD-10-CM

## 2014-10-16 DIAGNOSIS — F329 Major depressive disorder, single episode, unspecified: Secondary | ICD-10-CM | POA: Diagnosis not present

## 2014-10-16 NOTE — Patient Instructions (Signed)
Increase Citalopram to 1 1/2 tabs per day  Hormone options: Depo Provera Nexplanon Mirena Progesterone only birth control pills

## 2014-10-17 ENCOUNTER — Encounter: Payer: Self-pay | Admitting: Nurse Practitioner

## 2014-10-17 DIAGNOSIS — N921 Excessive and frequent menstruation with irregular cycle: Secondary | ICD-10-CM | POA: Insufficient documentation

## 2014-10-17 NOTE — Progress Notes (Signed)
Subjective:    Patient ID: Lisa Fields, female    DOB: 1980-09-16, 34 y.o.   MRN: [id number]  HPI presents for her wellness physical. Having occasional irregular menses, had to last month. Extremely heavy requiring a pad change every hour, wearing disposable underwear as well when she's working due to heavy bleeding. Cycles last about 7 days with several days of very heavy bleeding. Her last cycle ended on 9/9. Has had a tubal ligation for birth control. Same sexual partner. Also complaints of no libido. Decreased sensation in the pelvic area. Has been under more stress lately. Has increased her smoking from a half a pack to 1-1/2 packs per day. Regular vision exams. Needs a dental exam. Has felt extreme fatigue, denies suicidal or homicidal thoughts or ideation.    Review of Systems  Constitutional: Positive for fatigue. Negative for fever, activity change and appetite change.  HENT: Negative for dental problem, ear pain, sinus pressure and sore throat.   Respiratory: Negative for cough, chest tightness, shortness of breath and wheezing.   Cardiovascular: Negative for chest pain.  Gastrointestinal: Negative for nausea, vomiting, abdominal pain, diarrhea, constipation and abdominal distention.  Genitourinary: Positive for menstrual problem. Negative for dysuria, urgency, frequency, vaginal discharge, enuresis, difficulty urinating, genital sores and pelvic pain.  Psychiatric/Behavioral: Positive for sleep disturbance and dysphoric mood. The patient is nervous/anxious.        Objective:   Physical Exam  Constitutional: She is oriented to person, place, and time. She appears well-developed. No distress.  HENT:  Right Ear: External ear normal.  Left Ear: External ear normal.  Mouth/Throat: Oropharynx is clear and moist.  Neck: Normal range of motion. Neck supple. No tracheal deviation present. No thyromegaly present.  Cardiovascular: Normal rate, regular rhythm and normal heart  sounds.  Exam reveals no gallop.   No murmur heard. Pulmonary/Chest: Effort normal and breath sounds normal.  Abdominal: Soft. She exhibits no distension. There is no tenderness.  Genitourinary: Vagina normal and uterus normal. No vaginal discharge found.  External GU no rashes or lesions. Vagina a small amount of white mucoid discharge noted. No CMT. Bimanual exam slightly enlarged right ovary with mild tenderness, no rebound or guarding. Note the patient is midcycle.  Musculoskeletal: She exhibits no edema.  Lymphadenopathy:    She has no cervical adenopathy.  Neurological: She is alert and oriented to person, place, and time.  Skin: Skin is warm and dry. No rash noted.  Psychiatric: She has a normal mood and affect. Her behavior is normal.  Vitals reviewed.  Breast exam: Dense tissue with mild fine nodularity, no dominant masses. Axilla no adenopathy.      Assessment & Plan:   Problem List Items Addressed This Visit      Other   Depression   Menorrhagia with irregular cycle   Relevant Orders   CBC with Differential/Platelet    Other Visit Diagnoses    Routine general medical examination at a health care facility    -  Primary    Relevant Orders    Pap IG and HPV (high risk) DNA detection    CBC with Differential/Platelet    Lipid panel    Hepatic function panel    Basic metabolic panel    TSH    Vit D  25 hydroxy (rtn osteoporosis monitoring)    Other fatigue        Relevant Orders    CBC with Differential/Platelet    Basic metabolic panel    TSH  Screening for cervical cancer        Relevant Orders    Pap IG and HPV (high risk) DNA detection    Screening for HPV (human papillomavirus)        Relevant Orders    Pap IG and HPV (high risk) DNA detection        Discussed importance of stress reduction and adequate rest. Recommend dental care. Follow-up in 3 weeks, at that time recheck pelvic area particularly right ovary and review lab work. Increase Celexa 20 mg  to one and a half tabs by mouth daily. Patient to seek help in the meantime if symptoms worsen. Also given information on hormonal options to be discussed at next visit. Explained that she will need to avoid estrogen because of her age and smoking status. Return in about 3 weeks (around 11/06/2014) for recheck.

## 2014-10-18 LAB — PAP IG AND HPV HIGH-RISK
HPV, HIGH-RISK: NEGATIVE
PAP SMEAR COMMENT: 0

## 2014-10-18 LAB — BASIC METABOLIC PANEL
BUN / CREAT RATIO: 12 (ref 8–20)
BUN: 8 mg/dL (ref 6–20)
CHLORIDE: 101 mmol/L (ref 97–108)
CO2: 22 mmol/L (ref 18–29)
Calcium: 9.3 mg/dL (ref 8.7–10.2)
Creatinine, Ser: 0.69 mg/dL (ref 0.57–1.00)
GFR calc non Af Amer: 114 mL/min/{1.73_m2} (ref >59)
GFR, EST AFRICAN AMERICAN: 131 mL/min/{1.73_m2} (ref >59)
Glucose: 87 mg/dL (ref 65–99)
POTASSIUM: 4 mmol/L (ref 3.5–5.2)
SODIUM: 138 mmol/L (ref 134–144)

## 2014-10-18 LAB — HEPATIC FUNCTION PANEL
ALK PHOS: 46 IU/L (ref 39–117)
ALT: 13 IU/L (ref 0–32)
AST: 17 IU/L (ref 0–40)
Albumin: 4.5 g/dL (ref 3.5–5.5)
BILIRUBIN TOTAL: 0.8 mg/dL (ref 0.0–1.2)
BILIRUBIN, DIRECT: 0.17 mg/dL (ref 0.00–0.40)
Total Protein: 7.1 g/dL (ref 6.0–8.5)

## 2014-10-18 LAB — CBC WITH DIFFERENTIAL/PLATELET
BASOS ABS: 0 10*3/uL (ref 0.0–0.2)
Basos: 1 %
EOS (ABSOLUTE): 0 10*3/uL (ref 0.0–0.4)
Eos: 1 %
HEMOGLOBIN: 14.5 g/dL (ref 11.1–15.9)
Hematocrit: 41.1 % (ref 34.0–46.6)
Immature Grans (Abs): 0 10*3/uL (ref 0.0–0.1)
Immature Granulocytes: 0 %
LYMPHS ABS: 1.9 10*3/uL (ref 0.7–3.1)
LYMPHS: 31 %
MCH: 32.8 pg (ref 26.6–33.0)
MCHC: 35.3 g/dL (ref 31.5–35.7)
MCV: 93 fL (ref 79–97)
MONOCYTES: 9 %
Monocytes Absolute: 0.6 10*3/uL (ref 0.1–0.9)
Neutrophils Absolute: 3.7 10*3/uL (ref 1.4–7.0)
Neutrophils: 58 %
Platelets: 234 10*3/uL (ref 150–379)
RBC: 4.42 x10E6/uL (ref 3.77–5.28)
RDW: 13.1 % (ref 12.3–15.4)
WBC: 6.3 10*3/uL (ref 3.4–10.8)

## 2014-10-18 LAB — LIPID PANEL
CHOLESTEROL TOTAL: 148 mg/dL (ref 100–199)
Chol/HDL Ratio: 2.9 ratio units (ref 0.0–4.4)
HDL: 51 mg/dL (ref >39)
LDL CALC: 84 mg/dL (ref 0–99)
TRIGLYCERIDES: 67 mg/dL (ref 0–149)
VLDL Cholesterol Cal: 13 mg/dL (ref 5–40)

## 2014-10-18 LAB — VITAMIN D 25 HYDROXY (VIT D DEFICIENCY, FRACTURES): Vit D, 25-Hydroxy: 26.2 ng/mL — ABNORMAL LOW (ref 30.0–100.0)

## 2014-10-18 LAB — TSH: TSH: 1.9 u[IU]/mL (ref 0.450–4.500)

## 2014-10-21 ENCOUNTER — Encounter: Payer: Self-pay | Admitting: Nurse Practitioner

## 2014-10-25 ENCOUNTER — Encounter: Payer: Self-pay | Admitting: Family Medicine

## 2014-10-25 ENCOUNTER — Ambulatory Visit (INDEPENDENT_AMBULATORY_CARE_PROVIDER_SITE_OTHER): Payer: 59 | Admitting: Family Medicine

## 2014-10-25 VITALS — BP 106/70 | Temp 99.4°F | Ht 68.0 in | Wt 155.0 lb

## 2014-10-25 DIAGNOSIS — J310 Chronic rhinitis: Secondary | ICD-10-CM

## 2014-10-25 DIAGNOSIS — J329 Chronic sinusitis, unspecified: Secondary | ICD-10-CM | POA: Diagnosis not present

## 2014-10-25 MED ORDER — BENZONATATE 100 MG PO CAPS: 100.0000 mg | ORAL_CAPSULE | Freq: Four times a day (QID) | ORAL | Status: DC | PRN

## 2014-10-25 MED ORDER — AMOXICILLIN-POT CLAVULANATE 875-125 MG PO TABS: 1.0000 | ORAL_TABLET | Freq: Two times a day (BID) | ORAL | Status: AC

## 2014-10-25 NOTE — Progress Notes (Signed)
   Subjective:    Patient ID: Lisa Fields, female    DOB: 1980/08/30, 34 y.o.   MRN: [id number]  Cough This is a new problem. Episode onset: 1 week. Associated symptoms include ear pain, a fever and headaches. Associated symptoms comments: vomiting. Treatments tried: alka seltzer cold and flu.    Motrin and tylenol  Non prod uctive  Gunky from the head   alks selt cold and sinus prn   T max 101    Smoke one half pk per da  Review of Systems  Constitutional: Positive for fever.  HENT: Positive for ear pain.   Respiratory: Positive for cough.   Neurological: Positive for headaches.       Objective:   Physical Exam   Alert moderate malaise H&T frontal maxillary tenderness pharynx normal neck supple lungs bronchial cough no wheezes or crackles heart rare rhythm     Assessment & Plan:  Impression post viral rhinosinusitis/bronchitis plan encouraged to stop smoking. Antibiotics prescribed. Symptom care discussed. WSL

## 2014-11-11 ENCOUNTER — Ambulatory Visit (INDEPENDENT_AMBULATORY_CARE_PROVIDER_SITE_OTHER): Payer: 59 | Admitting: Nurse Practitioner

## 2014-11-11 ENCOUNTER — Encounter: Payer: Self-pay | Admitting: Nurse Practitioner

## 2014-11-11 ENCOUNTER — Other Ambulatory Visit: Payer: Self-pay | Admitting: Nurse Practitioner

## 2014-11-11 VITALS — BP 110/78 | Wt 156.0 lb

## 2014-11-11 DIAGNOSIS — A499 Bacterial infection, unspecified: Secondary | ICD-10-CM | POA: Diagnosis not present

## 2014-11-11 DIAGNOSIS — R102 Pelvic and perineal pain: Secondary | ICD-10-CM

## 2014-11-11 DIAGNOSIS — F329 Major depressive disorder, single episode, unspecified: Secondary | ICD-10-CM | POA: Diagnosis not present

## 2014-11-11 DIAGNOSIS — B9689 Other specified bacterial agents as the cause of diseases classified elsewhere: Secondary | ICD-10-CM

## 2014-11-11 DIAGNOSIS — Z23 Encounter for immunization: Secondary | ICD-10-CM

## 2014-11-11 DIAGNOSIS — N760 Acute vaginitis: Secondary | ICD-10-CM

## 2014-11-11 DIAGNOSIS — F32A Depression, unspecified: Secondary | ICD-10-CM

## 2014-11-11 LAB — POCT URINALYSIS DIPSTICK
BILIRUBIN UA: NEGATIVE
Blood, UA: NEGATIVE
GLUCOSE UA: NEGATIVE
Ketones, UA: NEGATIVE
LEUKOCYTES UA: NEGATIVE
NITRITE UA: NEGATIVE
PH UA: 7
Protein, UA: NEGATIVE
Spec Grav, UA: <=1.005
Urobilinogen, UA: NEGATIVE

## 2014-11-11 LAB — POCT WET PREP WITH KOH
CLUE CELLS WET PREP PER HPF POC: NEGATIVE
KOH Prep POC: NEGATIVE
Trichomonas, UA: NEGATIVE
pH, Wet Prep: 5

## 2014-11-11 MED ORDER — CITALOPRAM HYDROBROMIDE 40 MG PO TABS: 40.0000 mg | ORAL_TABLET | Freq: Every day | ORAL | Status: DC

## 2014-11-11 MED ORDER — METRONIDAZOLE 500 MG PO TABS: 500.0000 mg | ORAL_TABLET | Freq: Two times a day (BID) | ORAL | Status: DC

## 2014-11-11 MED ORDER — NORETHINDRONE 0.35 MG PO TABS: 1.0000 | ORAL_TABLET | Freq: Every day | ORAL | Status: DC

## 2014-11-11 NOTE — Progress Notes (Signed)
Subjective:  Presents for recheck on right pelvic pain. Has had it consistently since last visit. Has remote history of cysts on this ovary by [place] but no follow up was done. Cycles regular, normal flow. About midcycle. Has had BTL. Married, same sexual partner. Large amount of white discharge. No fever. Pain during intercourse near introitus. Tried to cut Celexa 20 mg in half but had difficulty. Went ahead and started taking 2 which is working very well. Also, she has made some significant personal changes which has helped her stress level. Mild urgency. No dysuria. No back pain.  Objective:   BP 110/78 mmHg  Wt 156 lb (70.761 kg) NAD. Alert, oriented. Lungs clear. No CVA tenderness. Heart RRR. External GU: mild swelling along skin tags at introitus; no erythema or lesions. Vagina moderate amount white mostly clear stringy discharge consistent with mid cycle ovulation. No CMT. Bimanual exam: tenderness at right adnexa which is slightly larger compared to left. Exam otherwise normal.  Results for orders placed or performed in visit on 11/11/14  POCT urinalysis dipstick  Result Value Ref Range   Color, UA yellow    Clarity, UA clear    Glucose, UA neg    Bilirubin, UA neg    Ketones, UA neg    Spec Grav, UA <=1.005    Blood, UA neg    pH, UA 7.0    Protein, UA neg    Urobilinogen, UA negative    Nitrite, UA neg    Leukocytes, UA Negative Negative  POCT Wet Prep with KOH  Result Value Ref Range   Trichomonas, UA Negative    Clue Cells Wet Prep HPF POC neg    Epithelial Wet Prep HPF POC Few Few, Moderate, Many   Yeast Wet Prep HPF POC none    Bacteria Wet Prep HPF POC None None, Few   RBC Wet Prep HPF POC none    WBC Wet Prep HPF POC none    KOH Prep POC Negative    pH, Wet Prep 5.0      Assessment:  Problem List Items Addressed This Visit      Other   Depression   Relevant Medications   citalopram (CELEXA) 40 MG tablet    Other Visit Diagnoses    Pelvic pain in female    -   Primary    Relevant Orders    GC/Chlamydia Probe Amp    POCT urinalysis dipstick (Completed)    POCT Wet Prep with KOH (Completed)    US Pelvis Complete    Bacterial vaginosis        Relevant Medications    metroNIDAZOLE (FLAGYL) 500 MG tablet    Other Relevant Orders    POCT Wet Prep with KOH (Completed)    US Pelvis Complete       Plan:  Meds ordered this encounter  Medications  . citalopram (CELEXA) 40 MG tablet    Sig: Take 1 tablet (40 mg total) by mouth daily.    Dispense:  90 tablet    Refill:  1    Order Specific Question:  Supervising Provider    Answer:  Mikey Kirschner [[id number]]  . norethindrone (ORTHO MICRONOR) 0.35 MG tablet    Sig: Take 1 tablet (0.35 mg total) by mouth daily.    Dispense:  3 Package    Refill:  1    Order Specific Question:  Supervising Provider    Answer:  Mikey Kirschner [[id number]]  . metroNIDAZOLE (FLAGYL) 500 MG  tablet    Sig: Take 1 tablet (500 mg total) by mouth 2 (two) times daily with a meal.    Dispense:  14 tablet    Refill:  0    Order Specific Question:  Supervising Provider    Answer:  Mikey Kirschner [[id number]]   Start Micronor to help cycles and pelvic pain. GC/CT pending. Refills on Celexa 40 mg. Pelvic US scheduled. Patient to call back sooner or go to ED if symptoms worsen.  Return in about 6 months (around 05/12/2015) for followup.

## 2014-11-12 LAB — GC/CHLAMYDIA PROBE AMP
Chlamydia trachomatis, NAA: NEGATIVE
NEISSERIA GONORRHOEAE BY PCR: NEGATIVE

## 2014-11-18 ENCOUNTER — Ambulatory Visit (HOSPITAL_COMMUNITY): Admission: RE | Admit: 2014-11-18 | Payer: 59 | Source: Ambulatory Visit

## 2015-02-01 ENCOUNTER — Encounter (HOSPITAL_COMMUNITY): Payer: Self-pay

## 2015-02-01 ENCOUNTER — Emergency Department (HOSPITAL_COMMUNITY): Payer: PRIVATE HEALTH INSURANCE

## 2015-02-01 ENCOUNTER — Emergency Department (HOSPITAL_COMMUNITY)
Admission: EM | Admit: 2015-02-01 | Discharge: 2015-02-01 | Disposition: A | Discharge status: Home / Self Care | Payer: PRIVATE HEALTH INSURANCE | Attending: Emergency Medicine | Admitting: Emergency Medicine

## 2015-02-01 DIAGNOSIS — S81831A Puncture wound without foreign body, right lower leg, initial encounter: Secondary | ICD-10-CM | POA: Diagnosis not present

## 2015-02-01 DIAGNOSIS — Z8742 Personal history of other diseases of the female genital tract: Secondary | ICD-10-CM | POA: Insufficient documentation

## 2015-02-01 DIAGNOSIS — Z91040 Latex allergy status: Secondary | ICD-10-CM | POA: Insufficient documentation

## 2015-02-01 DIAGNOSIS — Y9389 Activity, other specified: Secondary | ICD-10-CM | POA: Insufficient documentation

## 2015-02-01 DIAGNOSIS — W01198A Fall on same level from slipping, tripping and stumbling with subsequent striking against other object, initial encounter: Secondary | ICD-10-CM | POA: Insufficient documentation

## 2015-02-01 DIAGNOSIS — F17210 Nicotine dependence, cigarettes, uncomplicated: Secondary | ICD-10-CM | POA: Diagnosis not present

## 2015-02-01 DIAGNOSIS — T1490XA Injury, unspecified, initial encounter: Secondary | ICD-10-CM

## 2015-02-01 DIAGNOSIS — Y9289 Other specified places as the place of occurrence of the external cause: Secondary | ICD-10-CM | POA: Diagnosis not present

## 2015-02-01 DIAGNOSIS — Y990 Civilian activity done for income or pay: Secondary | ICD-10-CM | POA: Insufficient documentation

## 2015-02-01 DIAGNOSIS — G8929 Other chronic pain: Secondary | ICD-10-CM | POA: Diagnosis not present

## 2015-02-01 DIAGNOSIS — S81811A Laceration without foreign body, right lower leg, initial encounter: Secondary | ICD-10-CM | POA: Diagnosis present

## 2015-02-01 DIAGNOSIS — Z79899 Other long term (current) drug therapy: Secondary | ICD-10-CM | POA: Insufficient documentation

## 2015-02-01 DIAGNOSIS — Z8709 Personal history of other diseases of the respiratory system: Secondary | ICD-10-CM | POA: Insufficient documentation

## 2015-02-01 DIAGNOSIS — Z23 Encounter for immunization: Secondary | ICD-10-CM | POA: Insufficient documentation

## 2015-02-01 MED ORDER — TETANUS-DIPHTH-ACELL PERTUSSIS 5-2.5-18.5 LF-MCG/0.5 IM SUSP
0.5000 mL | Freq: Once | INTRAMUSCULAR | Status: AC
  Administered 2015-02-01: 0.5 mL via INTRAMUSCULAR
  Filled 2015-02-01: qty 0.5

## 2015-02-01 MED ORDER — CEPHALEXIN 500 MG PO CAPS: 500.0000 mg | ORAL_CAPSULE | Freq: Three times a day (TID) | ORAL | Status: DC

## 2015-02-01 MED ORDER — CEPHALEXIN 500 MG PO CAPS
500.0000 mg | ORAL_CAPSULE | Freq: Once | ORAL | Status: AC
  Administered 2015-02-01: 500 mg via ORAL
  Filled 2015-02-01: qty 1

## 2015-02-01 NOTE — ED Provider Notes (Signed)
CSN: [id number]     Arrival date & time 02/01/15  1532 History   First MD Initiated Contact with Patient 02/01/15 1548     Chief Complaint  Patient presents with  . Laceration     (Consider location/radiation/quality/duration/timing/severity/associated sxs/prior Treatment) Patient is a 35 y.o. female presenting with skin laceration. The history is provided by the patient. No language interpreter was used.  Laceration Location:  Leg Leg laceration location:  R lower leg Depth:  Through underlying tissue Time since incident:  8 hours Pain details:    Quality:  Foreign body sensation and sharp   Severity:  Moderate   Timing:  Constant   Progression:  Unchanged Foreign body present:  Unable to specify Relieved by:  Nothing Worsened by:  Nothing tried Ineffective treatments:  Pressure Tetanus status:  Unknown  VENETTE LOGEMANN is a 35 y.o. female who presents to the ED with a laceration to the right lower leg. She is an employee at the [employer] and reports she hit her leg against a trash can that had a broken area that was sticking out. She then fell. She is concerned because even after cleaning the area and applying pressure dressing it continues to bleed. She is also concerned that there may be something still in her leg.   Past Medical History  Diagnosis Date  . Ovarian cyst   . Bronchitis   . Chronic headaches    Past Surgical History  Procedure Laterality Date  . Tubal ligation    . Appendectomy    . Ovarian cyst surgery     No family history on file. Social History  Substance Use Topics  . Smoking status: Current Every Day Smoker -- 0.50 packs/day    Types: Cigarettes    Start date: 07/23/2013  . Smokeless tobacco: None  . Alcohol Use: No   OB History    No data available     Review of Systems  Musculoskeletal:       Puncture laceration to the right lower leg  Skin: Positive for wound.  all other systems negative    Allergies  Ivp dye; Sulfa  antibiotics; Codeine; Latex; Morphine and related; Topamax; and Vicodin  Home Medications   Prior to Admission medications   Medication Sig Start Date End Date Taking? Authorizing Provider  Cholecalciferol (VITAMIN D PO) Take 1 tablet by mouth daily.   Yes Historical Provider, MD  citalopram (CELEXA) 40 MG tablet Take 1 tablet (40 mg total) by mouth daily. 11/11/14  Yes Nilda Simmer, NP  ibuprofen (ADVIL,MOTRIN) 200 MG tablet Take 400 mg by mouth every 4 (four) hours as needed for fever or moderate pain.   Yes Historical Provider, MD  Melatonin 10 MG TABS Take 10 mg by mouth at bedtime.   Yes Historical Provider, MD  Multiple Vitamin (MULTIVITAMIN) tablet Take 1 tablet by mouth daily.   Yes Historical Provider, MD  norethindrone (ORTHO MICRONOR) 0.35 MG tablet Take 1 tablet (0.35 mg total) by mouth daily. 11/11/14  Yes Nilda Simmer, NP  PE-GG-APAP & PE-DPH-APAP (MUCINEX SINUS-MAX DAY/NIGHT) MISC Take 10-15 mLs by mouth at bedtime as needed (for cold and flu symptoms).   Yes Historical Provider, MD  cephALEXin (KEFLEX) 500 MG capsule Take 1 capsule (500 mg total) by mouth 3 (three) times daily. 02/01/15   Hope Bunnie Pion, NP   BP 119/71 mmHg  Pulse 105  Temp(Src) 98.5 F (36.9 C) (Oral)  Resp 18  Ht 5\' 8"  (1.727 m)  Wt 72.576 kg  BMI 24.33 kg/m2  SpO2 100%  LMP 12/17/2014 Physical Exam  Constitutional: She is oriented to person, place, and time. She appears well-developed and well-nourished. No distress.  HENT:  Head: Normocephalic.  Eyes: EOM are normal.  Neck: Neck supple.  Cardiovascular: Normal rate.   Pulmonary/Chest: Effort normal.  Musculoskeletal: Normal range of motion.       Right lower leg: She exhibits laceration.       Legs: 0.5 cm puncture laceration to the right lower leg. Adequate circulation, good touch sensation.   Neurological: She is alert and oriented to person, place, and time. No cranial nerve deficit.  Skin: Skin is warm and dry.  Psychiatric: She  has a normal mood and affect. Her behavior is normal.  Nursing note and vitals reviewed.   ED Course  Procedures (including critical care time) Wound cleaned and then irrigated with NSS X-ray Tetanus updated Pressure dressing  Imaging Review Dg Tibia/fibula Right  02/01/2015  CLINICAL DATA:  Injury to mid shaft lower right leg. EXAM: RIGHT TIBIA AND FIBULA - 2 VIEW COMPARISON:  None. FINDINGS: There is no evidence of fracture or other focal bone lesions. Soft tissues are unremarkable. IMPRESSION: Negative. Electronically Signed   By: Franki Cabot M.D.   On: 02/01/2015 16:20   I have personally reviewed and evaluated these image results as part of my medical decision-making.   MDM  35 y.o. female with puncture laceration, bruising and swelling to the right lower leg and bruising to the left knee s/p fall earlier today. Stable for d/c without fracture or foreign body noted on x-ray. Wound cleaned and pressure dressing applied. She will take ibuprofen as needed for pain, rest and elevate the area. Due to the puncture from the trash can will start antibiotics. Discussed with the patient and all questioned fully answered. She will return if any problems arise.   Final diagnoses:  Injury  Puncture wound, lower leg, right, initial encounter       [facility], NP 02/01/15 1848  Merrily Pew, MD 02/01/15 1850

## 2015-02-01 NOTE — ED Notes (Signed)
Pt reports hit her shin on a trash can at work.  Pt says injury happened this morning but says won't stop bleeding.

## 2015-02-07 MED FILL — CITALOPRAM HBR 40 MG TABLET: 40 | 90 days supply | Qty: 90 | Fill #1

## 2015-02-27 ENCOUNTER — Emergency Department (HOSPITAL_COMMUNITY)
Admission: EM | Admit: 2015-02-27 | Discharge: 2015-02-27 | Disposition: A | Discharge status: Home / Self Care | Payer: 59 | Attending: Emergency Medicine | Admitting: Emergency Medicine

## 2015-02-27 ENCOUNTER — Encounter (HOSPITAL_COMMUNITY): Payer: Self-pay | Admitting: *Deleted

## 2015-02-27 DIAGNOSIS — F419 Anxiety disorder, unspecified: Secondary | ICD-10-CM | POA: Diagnosis not present

## 2015-02-27 DIAGNOSIS — Z91040 Latex allergy status: Secondary | ICD-10-CM | POA: Insufficient documentation

## 2015-02-27 DIAGNOSIS — F17210 Nicotine dependence, cigarettes, uncomplicated: Secondary | ICD-10-CM | POA: Diagnosis not present

## 2015-02-27 DIAGNOSIS — S61216A Laceration without foreign body of right little finger without damage to nail, initial encounter: Secondary | ICD-10-CM | POA: Insufficient documentation

## 2015-02-27 DIAGNOSIS — Z8709 Personal history of other diseases of the respiratory system: Secondary | ICD-10-CM | POA: Diagnosis not present

## 2015-02-27 DIAGNOSIS — G8929 Other chronic pain: Secondary | ICD-10-CM | POA: Diagnosis not present

## 2015-02-27 DIAGNOSIS — S6991XA Unspecified injury of right wrist, hand and finger(s), initial encounter: Secondary | ICD-10-CM | POA: Diagnosis present

## 2015-02-27 DIAGNOSIS — Z79899 Other long term (current) drug therapy: Secondary | ICD-10-CM | POA: Diagnosis not present

## 2015-02-27 DIAGNOSIS — Z792 Long term (current) use of antibiotics: Secondary | ICD-10-CM | POA: Insufficient documentation

## 2015-02-27 DIAGNOSIS — Y998 Other external cause status: Secondary | ICD-10-CM | POA: Diagnosis not present

## 2015-02-27 DIAGNOSIS — Y9289 Other specified places as the place of occurrence of the external cause: Secondary | ICD-10-CM | POA: Diagnosis not present

## 2015-02-27 DIAGNOSIS — Y9389 Activity, other specified: Secondary | ICD-10-CM | POA: Diagnosis not present

## 2015-02-27 DIAGNOSIS — S61219A Laceration without foreign body of unspecified finger without damage to nail, initial encounter: Secondary | ICD-10-CM

## 2015-02-27 DIAGNOSIS — Z793 Long term (current) use of hormonal contraceptives: Secondary | ICD-10-CM | POA: Diagnosis not present

## 2015-02-27 DIAGNOSIS — Z8742 Personal history of other diseases of the female genital tract: Secondary | ICD-10-CM | POA: Diagnosis not present

## 2015-02-27 DIAGNOSIS — W268XXA Contact with other sharp object(s), not elsewhere classified, initial encounter: Secondary | ICD-10-CM | POA: Diagnosis not present

## 2015-02-27 MED ORDER — ACETAMINOPHEN 325 MG PO TABS
650.0000 mg | ORAL_TABLET | Freq: Once | ORAL | Status: AC
  Administered 2015-02-27: 650 mg via ORAL
  Filled 2015-02-27: qty 2

## 2015-02-27 MED ORDER — IBUPROFEN 400 MG PO TABS
400.0000 mg | ORAL_TABLET | Freq: Once | ORAL | Status: AC
  Administered 2015-02-27: 400 mg via ORAL
  Filled 2015-02-27: qty 1

## 2015-02-27 NOTE — Discharge Instructions (Signed)
Please cleanse the wound with soap and water. Apply clean dressing daily until healed. Please see your primary physician, or return to the emergency department if the bleeding continues, or there any signs of advancing infection. Laceration Care, Adult A laceration is a cut that goes through all layers of the skin. The cut also goes into the tissue that is right under the skin. Some cuts heal on their own. Others need to be closed with stitches (sutures), staples, skin adhesive strips, or wound glue. Taking care of your cut lowers your risk of infection and helps your cut to heal better. HOW TO TAKE CARE OF YOUR CUT For stitches or staples:  Keep the wound clean and dry.  If you were given a bandage (dressing), you should change it at least one time per day or as told by your doctor. You should also change it if it gets wet or dirty.  Keep the wound completely dry for the first 24 hours or as told by your doctor. After that time, you may take a shower or a bath. However, make sure that the wound is not soaked in water until after the stitches or staples have been removed.  Clean the wound one time each day or as told by your doctor:  Wash the wound with soap and water.  Rinse the wound with water until all of the soap comes off.  Pat the wound dry with a clean towel. Do not rub the wound.  After you clean the wound, put a thin layer of antibiotic ointment on it as told by your doctor. This ointment:  Helps to prevent infection.  Keeps the bandage from sticking to the wound.  Have your stitches or staples removed as told by your doctor. If your doctor used skin adhesive strips:   Keep the wound clean and dry.  If you were given a bandage, you should change it at least one time per day or as told by your doctor. You should also change it if it gets dirty or wet.  Do not get the skin adhesive strips wet. You can take a shower or a bath, but be careful to keep the wound dry.  If the  wound gets wet, pat it dry with a clean towel. Do not rub the wound.  Skin adhesive strips fall off on their own. You can trim the strips as the wound heals. Do not remove any strips that are still stuck to the wound. They will fall off after a while. If your doctor used wound glue:  Try to keep your wound dry, but you may briefly wet it in the shower or bath. Do not soak the wound in water, such as by swimming.  After you take a shower or a bath, gently pat the wound dry with a clean towel. Do not rub the wound.  Do not do any activities that will make you really sweaty until the skin glue has fallen off on its own.  Do not apply liquid, cream, or ointment medicine to your wound while the skin glue is still on.  If you were given a bandage, you should change it at least one time per day or as told by your doctor. You should also change it if it gets dirty or wet.  If a bandage is placed over the wound, do not let the tape for the bandage touch the skin glue.  Do not pick at the glue. The skin glue usually stays on for 5-10  days. Then, it falls off of the skin. General Instructions  To help prevent scarring, make sure to cover your wound with sunscreen whenever you are outside after stitches are removed, after adhesive strips are removed, or when wound glue stays in place and the wound is healed. Make sure to wear a sunscreen of at least 30 SPF.  Take over-the-counter and prescription medicines only as told by your doctor.  If you were given antibiotic medicine or ointment, take or apply it as told by your doctor. Do not stop using the antibiotic even if your wound is getting better.  Do not scratch or pick at the wound.  Keep all follow-up visits as told by your doctor. This is important.  Check your wound every day for signs of infection. Watch for:  Redness, swelling, or pain.  Fluid, blood, or pus.  Raise (elevate) the injured area above the level of your heart while you are  sitting or lying down, if possible. GET HELP IF:  You got a tetanus shot and you have any of these problems at the injection site:  Swelling.  Very bad pain.  Redness.  Bleeding.  You have a fever.  A wound that was closed breaks open.  You notice a bad smell coming from your wound or your bandage.  You notice something coming out of the wound, such as wood or glass.  Medicine does not help your pain.  You have more redness, swelling, or pain at the site of your wound.  You have fluid, blood, or pus coming from your wound.  You notice a change in the color of your skin near your wound.  You need to change the bandage often because fluid, blood, or pus is coming from the wound.  You start to have a new rash.  You start to have numbness around the wound. GET HELP RIGHT AWAY IF:  You have very bad swelling around the wound.  Your pain suddenly gets worse and is very bad.  You notice painful lumps near the wound or on skin that is anywhere on your body.  You have a red streak going away from your wound.  The wound is on your hand or foot and you cannot move a finger or toe like you usually can.  The wound is on your hand or foot and you notice that your fingers or toes look pale or bluish.   This information is not intended to replace advice given to you by your health care provider. Make sure you discuss any questions you have with your health care provider.   Document Released: 06/30/2007 Document Revised: 05/28/2014 Document Reviewed: 01/07/2014 Elsevier Interactive Patient Education [employer].

## 2015-02-27 NOTE — ED Provider Notes (Signed)
CSN: [id number]     Arrival date & time 02/27/15  1913 History   First MD Initiated Contact with Patient 02/27/15 2047     Chief Complaint  Patient presents with  . Extremity Laceration     (Consider location/radiation/quality/duration/timing/severity/associated sxs/prior Treatment) HPI Comments: Patient is a 35 year old female who presents to the emergency department with a laceration to the right fifth finger.  The patient states that around 4 PM she was using a vegetable slicer, when she injured her right fifth finger. She thinks that she took to a portion of the tip and could not get it to stop bleeding. She had tried applying pressure, but could not stand the pain. She took an Aleve but this only minimally helped. She presents now for assistance with the bleeding, and assistance with the discomfort. Her last tetanus shot was 3 weeks ago. Patient denies any bleeding disorders. She denies being on any anticoagulation medications.   Past Medical History  Diagnosis Date  . Ovarian cyst   . Bronchitis   . Chronic headaches    Past Surgical History  Procedure Laterality Date  . Tubal ligation    . Appendectomy    . Ovarian cyst surgery     History reviewed. No pertinent family history. Social History  Substance Use Topics  . Smoking status: Current Every Day Smoker -- 0.50 packs/day    Types: Cigarettes    Start date: 07/23/2013  . Smokeless tobacco: None  . Alcohol Use: No   OB History    No data available     Review of Systems  Constitutional: Negative for activity change.       All ROS Neg except as noted in HPI  HENT: Negative for nosebleeds.   Eyes: Negative for photophobia and discharge.  Respiratory: Negative for cough, shortness of breath and wheezing.   Cardiovascular: Negative for chest pain and palpitations.  Gastrointestinal: Negative for abdominal pain and blood in stool.  Genitourinary: Negative for dysuria, frequency and hematuria.  Musculoskeletal:  Negative for back pain, arthralgias and neck pain.  Skin: Negative.   Neurological: Negative for dizziness, seizures and speech difficulty.  Psychiatric/Behavioral: Negative for hallucinations and confusion.      Allergies  Ivp dye; Sulfa antibiotics; Codeine; Latex; Morphine and related; Topamax; and Vicodin  Home Medications   Prior to Admission medications   Medication Sig Start Date End Date Taking? Authorizing Provider  cephALEXin (KEFLEX) 500 MG capsule Take 1 capsule (500 mg total) by mouth 3 (three) times daily. 02/01/15   Hope Bunnie Pion, NP  Cholecalciferol (VITAMIN D PO) Take 1 tablet by mouth daily.    Historical Provider, MD  citalopram (CELEXA) 40 MG tablet Take 1 tablet (40 mg total) by mouth daily. 11/11/14   Nilda Simmer, NP  ibuprofen (ADVIL,MOTRIN) 200 MG tablet Take 400 mg by mouth every 4 (four) hours as needed for fever or moderate pain.    Historical Provider, MD  Melatonin 10 MG TABS Take 10 mg by mouth at bedtime.    Historical Provider, MD  Multiple Vitamin (MULTIVITAMIN) tablet Take 1 tablet by mouth daily.    Historical Provider, MD  norethindrone (ORTHO MICRONOR) 0.35 MG tablet Take 1 tablet (0.35 mg total) by mouth daily. 11/11/14   Nilda Simmer, NP  PE-GG-APAP & PE-DPH-APAP (MUCINEX SINUS-MAX DAY/NIGHT) MISC Take 10-15 mLs by mouth at bedtime as needed (for cold and flu symptoms).    Historical Provider, MD   BP 116/75 mmHg  Pulse 89  Temp(Src)  98 F (36.7 C) (Oral)  Resp 18  Ht 5\' 8"  (1.727 m)  Wt 73.936 kg  BMI 24.79 kg/m2  SpO2 100%  LMP 01/27/2015 Physical Exam  Constitutional: She is oriented to person, place, and time. She appears well-developed and well-nourished.  Non-toxic appearance.  HENT:  Head: Normocephalic.  Right Ear: Tympanic membrane and external ear normal.  Left Ear: Tympanic membrane and external ear normal.  Eyes: EOM and lids are normal. Pupils are equal, round, and reactive to light.  Neck: Normal range of motion.  Neck supple. Carotid bruit is not present.  Cardiovascular: Normal rate, regular rhythm, normal heart sounds, intact distal pulses and normal pulses.   Pulmonary/Chest: Breath sounds normal. No respiratory distress.  Abdominal: Soft. Bowel sounds are normal. There is no tenderness. There is no guarding.  Musculoskeletal: Normal range of motion.  The patient has an avulsion of some of the tissue of the tip of the right fifth finger. She has full range of motion of all fingers. There is active bleeding at this time. The radial pulses are 2+.  Lymphadenopathy:       Head (right side): No submandibular adenopathy present.       Head (left side): No submandibular adenopathy present.    She has no cervical adenopathy.  Neurological: She is alert and oriented to person, place, and time. She has normal strength. No cranial nerve deficit or sensory deficit.  No motor or sensory deficits noted of the upper extremities.  Skin: Skin is warm and dry.  Psychiatric: Her speech is normal. Her mood appears anxious.  Nursing note and vitals reviewed.   ED Course  .Marland KitchenLaceration Repair Date/Time: 02/27/2015 9:07 PM Performed by: Lily Kocher Authorized by: Lily Kocher Consent: Verbal consent obtained. Risks and benefits: risks, benefits and alternatives were discussed Consent given by: patient Patient understanding: patient states understanding of the procedure being performed Patient identity confirmed: arm band Time out: Immediately prior to procedure a "time out" was called to verify the correct patient, procedure, equipment, support staff and site/side marked as required. Body area: upper extremity Location details: right small finger Laceration length: 1 cm Preparation: Patient was prepped and draped in the usual sterile fashion. Irrigation solution: tap water Amount of cleaning: standard Wound skin closure material used: "Quick Clot" Dressing: gauze roll Patient tolerance: Patient tolerated  the procedure well with no immediate complications   (including critical care time) Labs Review Labs Reviewed - No data to display  Imaging Review No results found. I have personally reviewed and evaluated these images and lab results as part of my medical decision-making.   EKG Interpretation None      MDM  The wound was cleansed and examined. The patient has an avulsion of some of the skin of the tip of the right fifth finger. This was repaired with "quick clot", and a bandage.  The patient will use the naproxen for discomfort, and use Tylenol in between the naproxen doses. Patient is to see the primary physician or return to the emergency department if any signs of infection.    Final diagnoses:  None    **I have reviewed nursing notes, vital signs, and all appropriate lab and imaging results for this patient.    Lily Kocher, PA-C 02/27/15 2110  Noemi Chapel, MD 02/27/15 [phone]

## 2015-02-27 NOTE — ED Notes (Signed)
Pt reporting cutting finger on a vegetable slicer this afternoon.  Reports injury about 4 pm.  Reports last tetanus vaccine about 3 weeks ago.

## 2015-03-21 ENCOUNTER — Ambulatory Visit (INDEPENDENT_AMBULATORY_CARE_PROVIDER_SITE_OTHER): Payer: 59 | Admitting: Family Medicine

## 2015-03-21 ENCOUNTER — Encounter: Payer: Self-pay | Admitting: Family Medicine

## 2015-03-21 VITALS — BP 120/80 | Temp 98.1°F | Ht 68.0 in | Wt 161.4 lb

## 2015-03-21 DIAGNOSIS — J111 Influenza due to unidentified influenza virus with other respiratory manifestations: Secondary | ICD-10-CM

## 2015-03-21 MED ORDER — OSELTAMIVIR PHOSPHATE 75 MG PO CAPS: 75.0000 mg | ORAL_CAPSULE | Freq: Two times a day (BID) | ORAL | Status: DC

## 2015-03-21 MED ORDER — BENZONATATE 100 MG PO CAPS: 100.0000 mg | ORAL_CAPSULE | Freq: Three times a day (TID) | ORAL | Status: DC | PRN
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
## 2015-06-17 ENCOUNTER — Encounter: Payer: Self-pay | Admitting: Family Medicine

## 2015-06-17 ENCOUNTER — Ambulatory Visit (INDEPENDENT_AMBULATORY_CARE_PROVIDER_SITE_OTHER): Payer: 59 | Admitting: Family Medicine

## 2015-06-17 VITALS — BP 120/80 | Temp 98.4°F | Ht 68.0 in | Wt 160.6 lb

## 2015-06-17 DIAGNOSIS — J329 Chronic sinusitis, unspecified: Secondary | ICD-10-CM | POA: Diagnosis not present

## 2015-06-17 MED ORDER — AMOXICILLIN-POT CLAVULANATE 875-125 MG PO TABS: 1.0000 | ORAL_TABLET | Freq: Two times a day (BID) | ORAL | Status: DC

## 2015-06-17 NOTE — Progress Notes (Signed)
   Subjective:    Patient ID: Lisa Fields, female    DOB: 05-06-1980, 35 y.o.   MRN: [id number]  Cough This is a new problem. The current episode started in the past 7 days. Associated symptoms include a fever, headaches and a sore throat. Treatments tried: advil sinus, allegra, tylenol.    h a then ear pain  Now throat hurting  Poss low grade fever  Non prod cough   Feels clear but pressure   Using aleave two twic eper d prn   Review of Systems  Constitutional: Positive for fever.  HENT: Positive for sore throat.   Respiratory: Positive for cough.   Neurological: Positive for headaches.       Objective:   Physical Exam  Alert vital stable right TM retracted right anterior glands tender pharynx normal plus minus frontal tenderness lungs clear heart regular in rhythm.      Assessment & Plan:  Impression probable rhinosinusitis with eustachian tube dysfunction discussed plan antibiotics prescribed symptom care discussed warning signs discussed WSL

## 2015-06-23 ENCOUNTER — Emergency Department (HOSPITAL_COMMUNITY): Payer: 59

## 2015-06-23 ENCOUNTER — Encounter (HOSPITAL_COMMUNITY): Payer: Self-pay | Admitting: Emergency Medicine

## 2015-06-23 ENCOUNTER — Emergency Department (HOSPITAL_COMMUNITY)
Admission: EM | Admit: 2015-06-23 | Discharge: 2015-06-23 | Disposition: A | Discharge status: Home / Self Care | Payer: 59 | Attending: Emergency Medicine | Admitting: Emergency Medicine

## 2015-06-23 DIAGNOSIS — R6884 Jaw pain: Secondary | ICD-10-CM | POA: Diagnosis not present

## 2015-06-23 DIAGNOSIS — M542 Cervicalgia: Secondary | ICD-10-CM | POA: Insufficient documentation

## 2015-06-23 DIAGNOSIS — Z791 Long term (current) use of non-steroidal anti-inflammatories (NSAID): Secondary | ICD-10-CM | POA: Diagnosis not present

## 2015-06-23 DIAGNOSIS — Z79899 Other long term (current) drug therapy: Secondary | ICD-10-CM | POA: Diagnosis not present

## 2015-06-23 DIAGNOSIS — H9209 Otalgia, unspecified ear: Secondary | ICD-10-CM | POA: Diagnosis present

## 2015-06-23 DIAGNOSIS — F17210 Nicotine dependence, cigarettes, uncomplicated: Secondary | ICD-10-CM | POA: Insufficient documentation

## 2015-06-23 DIAGNOSIS — K029 Dental caries, unspecified: Secondary | ICD-10-CM | POA: Diagnosis not present

## 2015-06-23 DIAGNOSIS — R51 Headache: Secondary | ICD-10-CM | POA: Insufficient documentation

## 2015-06-23 LAB — POC URINE PREG, ED: Preg Test, Ur: NEGATIVE

## 2015-06-23 MED ORDER — TRAMADOL HCL 50 MG PO TABS: 50.0000 mg | ORAL_TABLET | Freq: Four times a day (QID) | ORAL | Status: DC | PRN

## 2015-06-23 MED ORDER — CLINDAMYCIN HCL 150 MG PO CAPS: 300.0000 mg | ORAL_CAPSULE | Freq: Four times a day (QID) | ORAL | Status: DC

## 2015-06-23 MED ORDER — FENTANYL CITRATE (PF) 100 MCG/2ML IJ SOLN
50.0000 ug | Freq: Once | INTRAMUSCULAR | Status: AC
  Administered 2015-06-23: 50 ug via INTRAMUSCULAR
  Filled 2015-06-23: qty 2

## 2015-06-23 MED ORDER — TRAMADOL HCL 50 MG PO TABS
50.0000 mg | ORAL_TABLET | Freq: Once | ORAL | Status: AC
  Administered 2015-06-23: 50 mg via ORAL
  Filled 2015-06-23: qty 1

## 2015-06-23 MED ORDER — CLINDAMYCIN HCL 150 MG PO CAPS
300.0000 mg | ORAL_CAPSULE | Freq: Once | ORAL | Status: AC
  Administered 2015-06-23: 300 mg via ORAL
  Filled 2015-06-23: qty 2

## 2015-06-23 NOTE — ED Notes (Signed)
Pt states she has been having right ear pain with pain behind ear and into temple.  States she has been on Amoxicillin for a week with pain worsening.  States at times her face will spasm on that side and her right eye is also being affected.

## 2015-06-23 NOTE — ED Notes (Signed)
Patient verbalizes understanding of discharge instructions, prescriptions, home care and follow up care. Patient out of department at this time. 

## 2015-06-23 NOTE — Discharge Instructions (Signed)
Dental Caries Dental caries is tooth decay. This decay can cause a hole in teeth (cavity) that can get bigger and deeper over time. HOME CARE  Brush and floss your teeth. Do this at least two times a day.  Use a fluoride toothpaste.  Use a mouth rinse if told by your dentist or doctor.  Eat less sugary and starchy foods. Drink less sugary drinks.  Avoid snacking often on sugary and starchy foods. Avoid sipping often on sugary drinks.  Keep regular checkups and cleanings with your dentist.  Use fluoride supplements if told by your dentist or doctor.  Allow fluoride to be applied to teeth if told by your dentist or doctor.   This information is not intended to replace advice given to you by your health care provider. Make sure you discuss any questions you have with your health care provider.   Document Released: 10/21/2007 Document Revised: 02/01/2014 Document Reviewed: 01/14/2012 Elsevier Interactive Patient Education 2016 [address] is a great source of information about community services available.  Access by dialing 2-1-1 from anywhere in [place], or by website -  CustodianSupply.fi.   Other Local Resources (Updated 01/2015)  Dental  Care   Services    Phone Number and Address  Cost  [facility] For children 33 - 71 years of age:   Cleaning  Tooth brushing/flossing instruction  Sealants, fillings, crowns  Extractions  Emergency treatment  [phone] [address], [place] [address] Charges based on family income.  Medicaid and some insurance plans accepted.     [facility], fillings, crowns  Extractions  Emergency treatment [phone] [address] [place], [name]  Pregnant women 19 years of age or older with a Medicaid card  [facility] [place]  Cleaning  Sealants, fillings, crowns  Extractions  Emergency treatment [phone] [address] [place], [name] Pregnant women 42 years of age or older with a Medicaid card  [facility] For children 22 - 36 years of age:   Cleaning  Tooth brushing/flossing instruction  Sealants, fillings, crowns  Extractions  Emergency treatment Limited orthodontic services for patients with Medicaid [phone], [place] [address] Medicaid and [facility] Health Choice cover for children up to age 13 and pregnant women.  Parents of children up to age 66 without Medicaid pay a reduced fee at time of service.  [place] For children 57 - 24 years of age:   Cleaning  Tooth brushing/flossing instruction  Sealants, fillings, crowns  Extractions  Emergency treatment Limited orthodontic services for patients with Medicaid [phone] [place], [name].  Medicaid and [place] Health Choice cover for children up to age 71 and pregnant women.  Parents of children up to age 73 without Medicaid pay a reduced fee.  [facility]  Sealants, fillings, crowns  Extractions  Hours: Tuesdays and Thursdays, 4:15 - 8 pm [phone] [address], [place], [place] [address] Services free of charge to [facility] residents ages 18-64 who do not have health insurance, Medicare, [place], or [place] benefits and fall within federal poverty guidelines  [place] care in addition to primary medical care, nutritional counseling,  and pharmacy:  Cleaning  Sealants, fillings, crowns  Extractions                  [phone] [facility], [place], [place], [place]  [place], [place], [facility] [place], [place], [place] [facility] [place], [place], [place], most insurance.  Also provides services available to all with fees adjusted based on ability to pay.    [facility]  Cleaning  Tooth brushing/flossing instruction  Sealants, fillings, crowns  Extractions  Emergency treatment Hours: Tuesdays, Thursdays, and Fridays from 8 am to 5 pm by appointment only. [phone] [place], [place] [address] [facility] residents with Medicaid (depending on eligibility) and children with [facility] Health Choice - call for more information.  [facility]  Extractions only  Hours: 2nd and 4th Thursday of each month from 6:30 am - 9 am.   [phone] ext. [place], [place] [address] Ages 17 and older only.  Patients are seen on a first come, first served basis.  [employer]  [employer]  Extractions  Orthodontics  Endodontics  Implants/Crowns/Bridges  Complete and partial dentures [phone] [place], [place] Patients must complete an application for services.  There is often a waiting list.
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
## 2015-06-26 NOTE — Telephone Encounter (Signed)
No additional info at this time.   

## 2015-07-17 MED FILL — CITALOPRAM HBR 20 MG TABLET: 20 | 90 days supply | Qty: 90 | Fill #1

## 2015-08-29 ENCOUNTER — Encounter: Payer: Self-pay | Admitting: Nurse Practitioner

## 2015-08-29 ENCOUNTER — Ambulatory Visit (INDEPENDENT_AMBULATORY_CARE_PROVIDER_SITE_OTHER): Payer: 59 | Admitting: Nurse Practitioner

## 2015-08-29 VITALS — BP 100/72 | Ht 68.0 in | Wt 158.5 lb

## 2015-08-29 DIAGNOSIS — N912 Amenorrhea, unspecified: Secondary | ICD-10-CM | POA: Diagnosis not present

## 2015-08-29 DIAGNOSIS — Z3042 Encounter for surveillance of injectable contraceptive: Secondary | ICD-10-CM

## 2015-08-29 DIAGNOSIS — N939 Abnormal uterine and vaginal bleeding, unspecified: Secondary | ICD-10-CM

## 2015-08-29 DIAGNOSIS — Z113 Encounter for screening for infections with a predominantly sexual mode of transmission: Secondary | ICD-10-CM

## 2015-08-29 DIAGNOSIS — Z3049 Encounter for surveillance of other contraceptives: Secondary | ICD-10-CM

## 2015-08-29 LAB — POCT URINE PREGNANCY: Preg Test, Ur: NEGATIVE

## 2015-08-29 MED ORDER — MEDROXYPROGESTERONE ACETATE 150 MG/ML IM SUSP
150.0000 mg | Freq: Once | INTRAMUSCULAR | Status: AC
  Administered 2015-08-29: 150 mg via INTRAMUSCULAR

## 2015-08-30 LAB — HIV ANTIBODY (ROUTINE TESTING W REFLEX): HIV SCREEN 4TH GENERATION: NONREACTIVE

## 2015-08-30 LAB — HSV(HERPES SIMPLEX VRS) I + II AB-IGG
HSV 1 GLYCOPROTEIN G AB, IGG: 6.57 {index} — AB (ref 0.00–0.90)
HSV 2 Glycoprotein G Ab, IgG: <0.91 index (ref 0.00–0.90)

## 2015-08-30 LAB — RPR: RPR Ser Ql: NONREACTIVE

## 2015-08-30 LAB — HEPATITIS C ANTIBODY

## 2015-08-31 ENCOUNTER — Encounter: Payer: Self-pay | Admitting: Nurse Practitioner

## 2015-08-31 NOTE — Progress Notes (Signed)
Subjective:  Presents requesting STD testing. States her partner cheated on her. No pelvic pain, fever or vaginal discharge. Smoker. Has had BTL. Regular cycles, no cycle x 2 months. Tried Micronor. Took a pregnancy test 2 weeks ago which was negative.   Objective:   BP 100/72   Ht 5\' 8"  (1.727 m)   Wt 158 lb 8 oz (71.9 kg)   BMI 24.10 kg/m  NAD. Alert, oriented. Lungs clear. Heart RRR.  Urine hcg neg.   Assessment: Amenorrhea - Plan: POCT urine pregnancy  Screen for STD (sexually transmitted disease) - Plan: GC/Chlamydia Probe Amp, HIV antibody, RPR, Hepatitis C Antibody, HSV(herpes simplex vrs) 1+2 ab-IgG  Abnormal uterine bleeding  Encounter for management and injection of depo-Provera - Plan: medroxyPROGESTERone (DEPO-PROVERA) injection 150 mg  Plan:  Meds ordered this encounter  Medications  . medroxyPROGESTERone (DEPO-PROVERA) injection 150 mg   Reviewed safe sex issues. Call back if any problems on Depo Provera. STD testing pending.

## 2015-09-02 LAB — GC/CHLAMYDIA PROBE AMP
CHLAMYDIA, DNA PROBE: NEGATIVE
Neisseria gonorrhoeae by PCR: NEGATIVE

## 2015-09-04 ENCOUNTER — Encounter (HOSPITAL_COMMUNITY): Payer: Self-pay | Admitting: Emergency Medicine

## 2015-09-04 ENCOUNTER — Encounter: Payer: Self-pay | Admitting: Nurse Practitioner

## 2015-09-04 ENCOUNTER — Emergency Department (HOSPITAL_COMMUNITY): Payer: 59

## 2015-09-04 ENCOUNTER — Ambulatory Visit (INDEPENDENT_AMBULATORY_CARE_PROVIDER_SITE_OTHER): Payer: 59 | Admitting: Nurse Practitioner

## 2015-09-04 ENCOUNTER — Emergency Department (HOSPITAL_COMMUNITY)
Admission: EM | Admit: 2015-09-04 | Discharge: 2015-09-04 | Disposition: A | Discharge status: Home / Self Care | Payer: 59 | Attending: Emergency Medicine | Admitting: Emergency Medicine

## 2015-09-04 VITALS — BP 110/68 | Temp 98.1°F | Ht 68.0 in | Wt 158.1 lb

## 2015-09-04 DIAGNOSIS — R590 Localized enlarged lymph nodes: Secondary | ICD-10-CM | POA: Diagnosis not present

## 2015-09-04 DIAGNOSIS — G43911 Migraine, unspecified, intractable, with status migrainosus: Secondary | ICD-10-CM

## 2015-09-04 DIAGNOSIS — R51 Headache: Secondary | ICD-10-CM | POA: Insufficient documentation

## 2015-09-04 DIAGNOSIS — Z7982 Long term (current) use of aspirin: Secondary | ICD-10-CM | POA: Insufficient documentation

## 2015-09-04 DIAGNOSIS — M542 Cervicalgia: Secondary | ICD-10-CM | POA: Insufficient documentation

## 2015-09-04 DIAGNOSIS — G43909 Migraine, unspecified, not intractable, without status migrainosus: Secondary | ICD-10-CM | POA: Diagnosis present

## 2015-09-04 DIAGNOSIS — Z792 Long term (current) use of antibiotics: Secondary | ICD-10-CM | POA: Insufficient documentation

## 2015-09-04 DIAGNOSIS — F17210 Nicotine dependence, cigarettes, uncomplicated: Secondary | ICD-10-CM | POA: Insufficient documentation

## 2015-09-04 DIAGNOSIS — Z79899 Other long term (current) drug therapy: Secondary | ICD-10-CM | POA: Insufficient documentation

## 2015-09-04 DIAGNOSIS — R519 Headache, unspecified: Secondary | ICD-10-CM

## 2015-09-04 MED ORDER — CYCLOBENZAPRINE HCL 10 MG PO TABS: 5.0000 mg | ORAL_TABLET | Freq: Three times a day (TID) | ORAL | 0 refills | Status: DC | PRN

## 2015-09-04 MED ORDER — DIPHENHYDRAMINE HCL 50 MG/ML IJ SOLN
25.0000 mg | Freq: Once | INTRAMUSCULAR | Status: AC
  Administered 2015-09-04: 25 mg via INTRAVENOUS
  Filled 2015-09-04: qty 1

## 2015-09-04 MED ORDER — PROCHLORPERAZINE EDISYLATE 5 MG/ML IJ SOLN
INTRAMUSCULAR | Status: AC
  Filled 2015-09-04: qty 2

## 2015-09-04 MED ORDER — METHYLPREDNISOLONE SODIUM SUCC 125 MG IJ SOLR
125.0000 mg | Freq: Once | INTRAMUSCULAR | Status: AC
  Administered 2015-09-04: 125 mg via INTRAVENOUS
  Filled 2015-09-04: qty 2

## 2015-09-04 MED ORDER — PROCHLORPERAZINE EDISYLATE 5 MG/ML IJ SOLN
10.0000 mg | Freq: Once | INTRAMUSCULAR | Status: AC
  Administered 2015-09-04: 10 mg via INTRAVENOUS
  Filled 2015-09-04: qty 2

## 2015-09-04 MED ORDER — AMOXICILLIN-POT CLAVULANATE 875-125 MG PO TABS: 1.0000 | ORAL_TABLET | Freq: Two times a day (BID) | ORAL | 0 refills | Status: DC

## 2015-09-04 MED ORDER — CLINDAMYCIN HCL 150 MG PO CAPS: 300.0000 mg | ORAL_CAPSULE | Freq: Four times a day (QID) | ORAL | 0 refills | Status: DC

## 2015-09-04 MED ORDER — KETOROLAC TROMETHAMINE 30 MG/ML IJ SOLN
30.0000 mg | Freq: Once | INTRAMUSCULAR | Status: AC
  Administered 2015-09-04: 30 mg via INTRAVENOUS
  Filled 2015-09-04: qty 1
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
## 2015-09-04 NOTE — ED Provider Notes (Signed)
[facility] DEPT Provider Note   CSN: [id number] Arrival date & time: 09/04/15  0950  First Provider Contact:  None    By signing my name below, I, Lisa Fields, attest that this documentation has been prepared under the direction and in the presence of Lisa Pew, MD . Electronically Signed: Higinio Fields, Scribe. 09/04/2015. 10:20 AM.  History   Chief Complaint Chief Complaint  Patient presents with  . Migraine   The history is provided by the patient. No language interpreter was used.   HPI Comments: Lisa Fields is a 35 y.o. female with PMHx of chronic headaches, who presents to the Emergency Department complaining of gradually worsening, "shooting and throbbing," 8/10, headache and neck pain that began at ~4:00 this morning and worsened today. Pt reports her pain begins in the back of her neck and radiates upwards toward the crown of her neck and her bilateral temples. She also states swelling around her lymph node on her right neck. She states her pain is exacerbated with movement of her head. Pt notes she visited her PCP this morning about her symptoms and was advised to visit the ED if her symptoms worsened. She states associated left sided facial tingling, blurry vision, photophobia and phonophobia; she states she is still experiencing blurry vision now but only in her left eye. She reports a hx of migraines but states she has never experienced similar symptoms before. She states her typical migraines only cause pain in her temples but do not radiate anywhere. She denies nausea, vomiting and fever.   Past Medical History:  Diagnosis Date  . Bronchitis   . Chronic headaches   . Ovarian cyst    Patient Active Problem List   Diagnosis Date Noted  . Menorrhagia with irregular cycle 10/17/2014  . Depression 08/27/2014   Past Surgical History:  Procedure Laterality Date  . APPENDECTOMY    . OVARIAN CYST SURGERY    . TUBAL LIGATION     OB History    No data available     Home Medications    Prior to Admission medications   Medication Sig Start Date End Date Taking? Authorizing Provider  aspirin-acetaminophen-caffeine (EXCEDRIN MIGRAINE) [phone] MG tablet Take 2 tablets by mouth every 6 (six) hours as needed for headache.   Yes Historical Provider, MD  Cholecalciferol (VITAMIN D PO) Take 1 tablet by mouth daily.   Yes Historical Provider, MD  citalopram (CELEXA) 40 MG tablet Take 1 tablet (40 mg total) by mouth daily. 11/11/14  Yes Nilda Simmer, NP  medroxyPROGESTERone (DEPO-PROVERA) 150 MG/ML injection Inject 150 mg into the muscle every 3 (three) months.   Yes Historical Provider, MD  Multiple Vitamin (MULTIVITAMIN) tablet Take 1 tablet by mouth daily.   Yes Historical Provider, MD  Naproxen Sod-Diphenhydramine (ALEVE PM) 220-25 MG TABS Take 1-2 tablets by mouth at bedtime.   Yes Historical Provider, MD  naproxen sodium (ALEVE) 220 MG tablet Take 660 mg by mouth every morning.   Yes Historical Provider, MD  amoxicillin-clavulanate (AUGMENTIN) 875-125 MG tablet Take 1 tablet by mouth 2 (two) times daily. 09/04/15   Lisa Pew, MD  clindamycin (CLEOCIN) 150 MG capsule Take 2 capsules (300 mg total) by mouth 4 (four) times daily. For 7 days 09/04/15   Lisa Pew, MD  cyclobenzaprine (FLEXERIL) 10 MG tablet Take 0.5 tablets (5 mg total) by mouth 3 (three) times daily as needed for muscle spasms. 09/04/15   Lisa Pew, MD    Family History History reviewed. No  pertinent family history.  Social History Social History  Substance Use Topics  . Smoking status: Current Every Day Smoker    Packs/day: 0.50    Types: Cigarettes    Start date: 07/23/2013  . Smokeless tobacco: Never Used  . Alcohol use No   Allergies   Ivp dye [iodinated diagnostic agents]; Sulfa antibiotics; [place] [fish allergy]; Cherry; Codeine; Dill oil; Latex; Morphine and related; Topamax [topiramate]; and Vicodin [hydrocodone-acetaminophen]  Review of Systems Review of Systems   Constitutional: Negative for fever.  Eyes: Positive for photophobia and visual disturbance.  Gastrointestinal: Negative for nausea and vomiting.  Musculoskeletal: Positive for neck pain.  Neurological: Positive for headaches.   Physical Exam Updated Vital Signs BP 111/71   Pulse 77   Temp 97.8 F (36.6 C) (Oral)   Resp 18   Ht 5\' 8"  (1.727 m)   Wt 158 lb 9.6 oz (71.9 kg)   SpO2 100%   BMI 24.12 kg/m   Physical Exam  Constitutional: She is oriented to person, place, and time. She appears well-developed and well-nourished.  HENT:  Head: Normocephalic and atraumatic.  Eyes: Conjunctivae and EOM are normal. Pupils are equal, round, and reactive to light. Right eye exhibits no discharge. Left eye exhibits no discharge. No scleral icterus.  Neck: Normal range of motion. No JVD present. No tracheal deviation present.  1 prominent right anterior lymph node ~1 cm that was TTP  Pulmonary/Chest: Effort normal. No stridor.  Neurological: She is alert and oriented to person, place, and time. Coordination normal.  Cranial nerved were otherwise intact  Normal sensation in BUE and BLE Normal patellar and bicep reflex   Psychiatric: She has a normal mood and affect. Her behavior is normal. Judgment and thought content normal.  Nursing note and vitals reviewed.  ED Treatments / Results  Labs (all labs ordered are listed, but only abnormal results are displayed) Labs Reviewed - No data to display  EKG  EKG Interpretation None       Radiology Ct Head Wo Contrast  Result Date: 09/04/2015 CLINICAL DATA:  Headaches and neck pain for several hours, initial encounter EXAM: CT HEAD WITHOUT CONTRAST TECHNIQUE: Contiguous axial images were obtained from the base of the skull through the vertex without intravenous contrast. COMPARISON:  01/10/2011 FINDINGS: The bony calvarium is intact. No gross soft tissue abnormality is noted. No findings to suggest acute hemorrhage, acute infarction or  space-occupying mass lesion are noted. IMPRESSION: No acute intracranial abnormality noted. Electronically Signed   By: Inez Catalina M.D.   On: 09/04/2015 11:35    Procedures Procedures  DIAGNOSTIC STUDIES:  Oxygen Saturation is 100% on RA, normal by my interpretation.    COORDINATION OF CARE:  10:13 AM Discussed treatment Fields, which includes CT head and migraine cocktail with pt at bedside and pt agreed to Fields.  Medications Ordered in ED Medications  methylPREDNISolone sodium succinate (SOLU-MEDROL) 125 mg/2 mL injection 125 mg (125 mg Intravenous Given 09/04/15 1058)  ketorolac (TORADOL) 30 MG/ML injection 30 mg (30 mg Intravenous Given 09/04/15 1059)  prochlorperazine (COMPAZINE) injection 10 mg (10 mg Intravenous Given 09/04/15 1059)  diphenhydrAMINE (BENADRYL) injection 25 mg (25 mg Intravenous Given 09/04/15 1059)    Initial Impression / Assessment and Fields / ED Course  I have reviewed the triage vital signs and the nursing notes.  Pertinent labs & imaging results that were available during my care of the patient were reviewed by me and considered in my medical decision making (see chart for details).  Clinical Course    Here with headache slightly different than normal. Associated with mild tingling in her right face. No neuro or CN abnormalities. Ct done as the headache was different and normal. Doubt cva, sah, meningitis at this time. Suspect tension type headache. Improved significantly with headache cocktail. Will follow up with neurology.   I personally performed the services described in this documentation, which was scribed in my presence. The recorded information has been reviewed and is accurate.   Final Clinical Impressions(s) / ED Diagnoses   Final diagnoses:  Nonintractable headache, unspecified chronicity pattern, unspecified headache type    New Prescriptions Discharge Medication List as of 09/04/2015  1:36 PM    START taking these medications   Details    cyclobenzaprine (FLEXERIL) 10 MG tablet Take 0.5 tablets (5 mg total) by mouth 3 (three) times daily as needed for muscle spasms., Starting Thu 09/04/2015, Print         Lisa Pew, MD 09/05/15 [phone]

## 2015-09-04 NOTE — ED Notes (Signed)
Pt sent by [facility] and NP reports pt went to  Bed last night with some neck stiffness and woke up at 4 am with severe headache in occipital region, dizziness, and blurred vision.  Also reports tingling to left side of face and large lymph node on r side of neck.  EDP notified prior to pt's arrival.

## 2015-09-04 NOTE — ED Triage Notes (Signed)
Woke up this am with migraine, rates pain 8/10.  Sensitive to light and sound.  Node to right neck.

## 2015-09-07 ENCOUNTER — Emergency Department (HOSPITAL_COMMUNITY)
Admission: EM | Admit: 2015-09-07 | Discharge: 2015-09-07 | Disposition: A | Discharge status: Home / Self Care | Payer: 59 | Attending: Emergency Medicine | Admitting: Emergency Medicine

## 2015-09-07 ENCOUNTER — Emergency Department (HOSPITAL_COMMUNITY): Payer: 59

## 2015-09-07 ENCOUNTER — Encounter (HOSPITAL_COMMUNITY): Payer: Self-pay | Admitting: Emergency Medicine

## 2015-09-07 DIAGNOSIS — Z7982 Long term (current) use of aspirin: Secondary | ICD-10-CM | POA: Insufficient documentation

## 2015-09-07 DIAGNOSIS — J029 Acute pharyngitis, unspecified: Secondary | ICD-10-CM | POA: Diagnosis present

## 2015-09-07 DIAGNOSIS — R05 Cough: Secondary | ICD-10-CM | POA: Diagnosis not present

## 2015-09-07 DIAGNOSIS — R590 Localized enlarged lymph nodes: Secondary | ICD-10-CM

## 2015-09-07 DIAGNOSIS — F17210 Nicotine dependence, cigarettes, uncomplicated: Secondary | ICD-10-CM | POA: Insufficient documentation

## 2015-09-07 DIAGNOSIS — J069 Acute upper respiratory infection, unspecified: Secondary | ICD-10-CM | POA: Insufficient documentation

## 2015-09-07 DIAGNOSIS — R591 Generalized enlarged lymph nodes: Secondary | ICD-10-CM | POA: Diagnosis not present

## 2015-09-07 LAB — RAPID STREP SCREEN (MED CTR MEBANE ONLY): Streptococcus, Group A Screen (Direct): NEGATIVE

## 2015-09-07 MED ORDER — BENZONATATE 100 MG PO CAPS: 100.0000 mg | ORAL_CAPSULE | Freq: Three times a day (TID) | ORAL | 0 refills | Status: DC | PRN

## 2015-09-07 MED ORDER — IBUPROFEN 400 MG PO TABS
400.0000 mg | ORAL_TABLET | Freq: Once | ORAL | Status: AC
  Administered 2015-09-07: 400 mg via ORAL
  Filled 2015-09-07: qty 1

## 2015-09-07 NOTE — ED Provider Notes (Signed)
[facility] DEPT Provider Note   CSN: [id number] Arrival date & time: 09/07/15  0700  First Provider Contact:  None       History   Chief Complaint Chief Complaint  Patient presents with  . Sore Throat    HPI Lisa Fields is a 35 y.o. female.  HPI  Pt was seen at 102.  Per pt, c/o gradual onset and persistence of constant sore throat, runny/stuffy nose, sinus congestion, and cough for the past 2-3 days.  Has been associated with home temps to "100.6," as well as acute flair of her chronic headache. LD tylenol approximately 0600 PTA. Denies rash, no CP/SOB, no N/V/D, no abd pain. Denies headache was sudden or maximal in onset or at any time.  Denies visual changes, no focal motor weakness, no tingling/numbness in extremities, no rash, no abd pain, no N/V/D, no CP/SOB.    Past Medical History:  Diagnosis Date  . Bronchitis   . Chronic headaches   . Ovarian cyst     Patient Active Problem List   Diagnosis Date Noted  . Menorrhagia with irregular cycle 10/17/2014  . Depression 08/27/2014    Past Surgical History:  Procedure Laterality Date  . APPENDECTOMY    . OVARIAN CYST SURGERY    . TUBAL LIGATION         Home Medications    Prior to Admission medications   Medication Sig Start Date End Date Taking? Authorizing Provider  amoxicillin-clavulanate (AUGMENTIN) 875-125 MG tablet Take 1 tablet by mouth 2 (two) times daily. 09/04/15   Merrily Pew, MD  aspirin-acetaminophen-caffeine (EXCEDRIN MIGRAINE) [phone] MG tablet Take 2 tablets by mouth every 6 (six) hours as needed for headache.    Historical Provider, MD  Cholecalciferol (VITAMIN D PO) Take 1 tablet by mouth daily.    Historical Provider, MD  citalopram (CELEXA) 40 MG tablet Take 1 tablet (40 mg total) by mouth daily. 11/11/14   Nilda Simmer, NP  clindamycin (CLEOCIN) 150 MG capsule Take 2 capsules (300 mg total) by mouth 4 (four) times daily. For 7 days 09/04/15   Merrily Pew, MD    cyclobenzaprine (FLEXERIL) 10 MG tablet Take 0.5 tablets (5 mg total) by mouth 3 (three) times daily as needed for muscle spasms. 09/04/15   Merrily Pew, MD  medroxyPROGESTERone (DEPO-PROVERA) 150 MG/ML injection Inject 150 mg into the muscle every 3 (three) months.    Historical Provider, MD  Multiple Vitamin (MULTIVITAMIN) tablet Take 1 tablet by mouth daily.    Historical Provider, MD  Naproxen Sod-Diphenhydramine (ALEVE PM) 220-25 MG TABS Take 1-2 tablets by mouth at bedtime.    Historical Provider, MD  naproxen sodium (ALEVE) 220 MG tablet Take 660 mg by mouth every morning.    Historical Provider, MD    Family History   Social History Social History  Substance Use Topics  . Smoking status: Current Every Day Smoker    Packs/day: 0.50    Types: Cigarettes    Start date: 07/23/2013  . Smokeless tobacco: Never Used  . Alcohol use No     Allergies   Ivp dye [iodinated diagnostic agents]; Sulfa antibiotics; Zaleski [fish allergy]; Cherry; Codeine; Dill oil; Latex; Morphine and related; Topamax [topiramate]; and Vicodin [hydrocodone-acetaminophen]   Review of Systems Review of Systems ROS: Statement: All systems negative except as marked or noted in the HPI; Constitutional: Negative for fever and chills. ; ; Eyes: Negative for eye pain, redness and discharge. ; ; ENMT: Negative for ear pain, hoarseness, +nasal  congestion, sinus pressure and sore throat. ; ; Cardiovascular: Negative for chest pain, palpitations, diaphoresis, dyspnea and peripheral edema. ; ; Respiratory: +cough. Negative for wheezing and stridor. ; ; Gastrointestinal: Negative for nausea, vomiting, diarrhea, abdominal pain, blood in stool, hematemesis, jaundice and rectal bleeding. ; ; Genitourinary: Negative for dysuria, flank pain and hematuria. ; ; Musculoskeletal: Negative for back pain and neck pain. Negative for swelling and trauma.; ; Skin: Negative for pruritus, rash, abrasions, blisters, bruising and skin lesion.; ;  Neuro: +chronic headache. Negative for lightheadedness and neck stiffness. Negative for weakness, altered level of consciousness, altered mental status, extremity weakness, paresthesias, involuntary movement, seizure and syncope.       Physical Exam Updated Vital Signs BP 121/80 (BP Location: Left Arm)   Pulse 92   Temp 98.3 F (36.8 C) (Oral)   Resp 16   Ht 5\' 8"  (1.727 m)   Wt 158 lb (71.7 kg)   LMP 08/30/2015 (Exact Date)   SpO2 99%   BMI 24.02 kg/m   Physical Exam 0725: Physical examination:  Nursing notes reviewed; Vital signs and O2 SAT reviewed;  Constitutional: Well developed, Well nourished, Well hydrated, In no acute distress; Head:  Normocephalic, atraumatic; Eyes: EOMI, PERRL, No scleral icterus; ENMT: Clear fluid behind TM's bilat. +edemetous nasal turbinates bilat with clear rhinorrhea. Mouth and pharynx without lesions. No tonsillar exudates. No intra-oral edema. No submandibular or sublingual edema. +hoarse voice. No drooling, no stridor. No pain with manipulation of larynx. No trismus. Mouth and pharynx normal, Mucous membranes moist; Neck: Supple, Full range of motion, +1 right lower cervical lymph node that is TTP, no rash, no ecchymosis. No meningeal signs.; Cardiovascular: Regular rate and rhythm, No gallop; Respiratory: Breath sounds clear & equal bilaterally, No wheezes.  Speaking full sentences with ease, Normal respiratory effort/excursion; Chest: Nontender, Movement normal; Abdomen: Soft, Nontender, Nondistended, Normal bowel sounds;; Extremities: Pulses normal, No tenderness, No edema, No calf edema or asymmetry.; Neuro: AA&Ox3, Major CN grossly intact.  Speech clear. No gross focal motor or sensory deficits in extremities. Climbs on and off stretcher easily by herself. Gait steady.; Skin: Color normal, Warm, Dry.    ED Treatments / Results  Labs (all labs ordered are listed, but only abnormal results are displayed)   EKG  EKG Interpretation None        Radiology   Procedures Procedures (including critical care time)  Medications Ordered in ED Medications - No data to display   Initial Impression / Assessment and Plan / ED Course  I have reviewed the triage vital signs and the nursing notes.  Pertinent labs & imaging results that were available during my care of the patient were reviewed by me and considered in my medical decision making (see chart for details).   MDM Reviewed: previous chart, nursing note and vitals Interpretation: x-ray and labs   Results for orders placed or performed during the hospital encounter of 09/07/15  Rapid strep screen  Result Value Ref Range   Streptococcus, Group A Screen (Direct) NEGATIVE NEGATIVE   Dg Chest 2 View Result Date: 09/07/2015 CLINICAL DATA:  Cough, laryngitis, Woke up this am with migraine, rates pain 8/10. Sensitive to light and sound. Node to right neck. EXAM: CHEST  2 VIEW COMPARISON:  11/19/2013 FINDINGS: Cardiac silhouette is normal in size and configuration. No mediastinal or hilar masses or evidence of adenopathy. The lungs are hyperexpanded but clear. No pleural effusion or pneumothorax. Skeletal structures are unremarkable. IMPRESSION: No acute cardiopulmonary disease. Electronically Signed  By: Lajean Manes M.D.   On: 09/07/2015 08:00     0750:  EPIC chart review reveals pt has had this right LN for the past 3+ months, has been associated with URI symptoms and was rx abx by her PMD for same. No emergent need at this time for imaging; pt can f/u with PMD for [place] or CT scan after prep (IVP dye allergy).   0915:  Workup reassuring. Tx symptomatically at this time. Dx and testing d/w pt.  Questions answered.  Verb understanding, agreeable to d/c home with outpt f/u.       Final Clinical Impressions(s) / ED Diagnoses   Final diagnoses:  None    New Prescriptions New Prescriptions   No medications on file     Francine Graven, DO 09/09/15 1657

## 2015-09-07 NOTE — Discharge Instructions (Signed)
Take over the counter tylenol and ibuprofen, as directed on packaging, as needed for discomfort.  Gargle with warm water several times per day to help with discomfort.  May also use over the counter sore throat pain medicines such as chloraseptic or sucrets, as directed on packaging, as needed for discomfort. Take over the counter decongestant (such as sudafed), as directed on packaging, for the next week.  Use over the counter normal saline nasal spray, as instructed in the Emergency Department, several times per day for the next 2 weeks.  Call your regular medical doctor tomorrow to schedule a follow up appointment this week and to re-check the lymph node in your neck (you may need outpatient imaging studies).  Return to the Emergency Department immediately if worsening.

## 2015-09-07 NOTE — ED Triage Notes (Signed)
Patient c/o sore throat, swollen lymph nodes to right side of neck, headache, fever, and dry cough since Friday. Per patient fever of 100.6 in which she has been taking tylenol. Patient reports taking tylenol last at 6am this morning.

## 2015-09-08 ENCOUNTER — Encounter: Payer: Self-pay | Admitting: Family Medicine

## 2015-09-08 ENCOUNTER — Ambulatory Visit (INDEPENDENT_AMBULATORY_CARE_PROVIDER_SITE_OTHER): Payer: 59 | Admitting: Family Medicine

## 2015-09-08 VITALS — BP 108/64 | Temp 99.0°F | Ht 68.0 in | Wt 158.0 lb

## 2015-09-08 DIAGNOSIS — N921 Excessive and frequent menstruation with irregular cycle: Secondary | ICD-10-CM

## 2015-09-08 DIAGNOSIS — J029 Acute pharyngitis, unspecified: Secondary | ICD-10-CM

## 2015-09-08 DIAGNOSIS — R590 Localized enlarged lymph nodes: Secondary | ICD-10-CM

## 2015-09-08 DIAGNOSIS — J040 Acute laryngitis: Secondary | ICD-10-CM | POA: Diagnosis not present

## 2015-09-08 LAB — POCT HEMOGLOBIN: HEMOGLOBIN: 14.1 g/dL (ref 12.2–16.2)

## 2015-09-08 MED ORDER — AMOXICILLIN-POT CLAVULANATE 400-57 MG/5ML PO SUSR: ORAL | 0 refills | Status: DC

## 2015-09-08 MED ORDER — PREDNISONE 20 MG PO TABS: ORAL_TABLET | ORAL | 0 refills | Status: DC

## 2015-09-08 NOTE — Progress Notes (Signed)
   Subjective:    Patient ID: Lisa Fields, female    DOB: 1980/02/27, 35 y.o.   MRN: [id number]  Sore Throat   This is a new problem. Episode onset: 4 days.   Seen last thurdsay by carolyn and sent to ED. Script for augmentin printed but was not given to pt. Went to [facility] again yesterday and did strep test that was negative and chest xray.   Eyes feel scrathy for the past week.   Heavy menstrual cycle for about 12 days. Feels weak. Hb today 14.1  Migraine has eased off to just a regular headache.   Pt not able to take any meds because she cannot swallow. Hard to eat. But able to drink fluids.  Does have a cat states that it did scratch her. Review of Systems Pain discomfort in the side of her neck difficulty speaking because of laryngitis able to swallow but it burns in her throat denies high fever chills sweats.    Objective:   Physical Exam Laryngitis noted lungs clear heart regular significant enlarged lymph node cervical lymph node probably it'll probably reactive lymph node no abscess seena light [place].  Short course of prednisone and antibiotic prescribed referral to ENT     Assessment & Plan:  If patient has ongoing irregular cycles follow-up with Pearson Forster FNP  Cervical lymphadenopathy significant tenderness I think this is a reactive lymph node possibility to cat scratch antibiotics prescribed in addition to this lab work ordered. Consultation with ENT because of adenopathy with laryngitis.

## 2015-09-09 ENCOUNTER — Encounter: Payer: Self-pay | Admitting: Family Medicine

## 2015-09-09 DIAGNOSIS — J029 Acute pharyngitis, unspecified: Secondary | ICD-10-CM | POA: Diagnosis not present

## 2015-09-09 DIAGNOSIS — N921 Excessive and frequent menstruation with irregular cycle: Secondary | ICD-10-CM | POA: Diagnosis not present

## 2015-09-10 LAB — CBC WITH DIFFERENTIAL/PLATELET
BASOS ABS: 0 {cells}/uL (ref 0–200)
BASOS PCT: 0 %
EOS ABS: 174 {cells}/uL (ref 15–500)
Eosinophils Relative: 3 %
HEMATOCRIT: 43.1 % (ref 35.0–45.0)
HEMOGLOBIN: 14.6 g/dL (ref 11.7–15.5)
LYMPHS ABS: 1914 {cells}/uL (ref 850–3900)
Lymphocytes Relative: 33 %
MCH: 32.5 pg (ref 27.0–33.0)
MCHC: 33.9 g/dL (ref 32.0–36.0)
MCV: 96 fL (ref 80.0–100.0)
MONO ABS: 580 {cells}/uL (ref 200–950)
MONOS PCT: 10 %
MPV: 9.7 fL (ref 7.5–12.5)
NEUTROS ABS: 3132 {cells}/uL (ref 1500–7800)
Neutrophils Relative %: 54 %
Platelets: 249 10*3/uL (ref 140–400)
RBC: 4.49 MIL/uL (ref 3.80–5.10)
RDW: 13.1 % (ref 11.0–15.0)
WBC: 5.8 10*3/uL (ref 3.8–10.8)

## 2015-09-10 LAB — CULTURE, GROUP A STREP (THRC)

## 2015-09-11 ENCOUNTER — Ambulatory Visit (INDEPENDENT_AMBULATORY_CARE_PROVIDER_SITE_OTHER): Payer: 59 | Admitting: Otolaryngology

## 2015-09-11 DIAGNOSIS — R590 Localized enlarged lymph nodes: Secondary | ICD-10-CM

## 2015-09-11 DIAGNOSIS — R070 Pain in throat: Secondary | ICD-10-CM

## 2015-09-17 LAB — BARTONELLA AB W/REFLEX TO TITER
B. HENSELAE IGG SCREEN: POSITIVE — AB
B. QUINTANA IGM SCREEN: NEGATIVE
B. henselae IgM Screen: POSITIVE — AB
B. quintana IgG Screen: NEGATIVE

## 2015-09-17 LAB — RFLX B. HENSELAE IGM TITER

## 2015-09-17 LAB — RFLX B. HENSELAE IGG TITER

## 2015-09-19 ENCOUNTER — Telehealth: Payer: Self-pay | Admitting: Family Medicine

## 2015-09-19 NOTE — Telephone Encounter (Signed)
Patient states that she had labs drawn at [facility]

## 2015-09-19 NOTE — Telephone Encounter (Signed)
Please call [facility] get results then show them to me please

## 2015-09-19 NOTE — Telephone Encounter (Signed)
[facility] and they stated that the do not have any labs for patient. Left message to have patient return call.

## 2015-09-19 NOTE — Telephone Encounter (Signed)
Calling to check on results to recent labwork.  She feels better than she did but not 100%.

## 2015-09-19 NOTE — Telephone Encounter (Signed)
Nurse's-please talk with the patient. Find out where she went to get her labs drawn. I do not see results in the system. #2-please find these results to show them to me this afternoon if at all possible

## 2015-09-21 NOTE — Telephone Encounter (Signed)
Sort of hard to tell the patient the results of tests for which no one knows what the test results are. Please let the patient know the dilemma. If necessary reorder the tests to be drawn at [facility] typically we don't have a problem with them(also [facility] may have been bought out by [facility]-possibly the patient can help get a copy of the sent to [place])

## 2015-09-22 NOTE — Telephone Encounter (Signed)
Left message to return call 

## 2015-09-22 NOTE — Telephone Encounter (Signed)
Discussed with patient. Patient will go by lab when she gets off work this evening and see if she can get a copy to bring by

## 2015-09-25 ENCOUNTER — Ambulatory Visit (INDEPENDENT_AMBULATORY_CARE_PROVIDER_SITE_OTHER): Payer: Self-pay | Admitting: Otolaryngology

## 2015-10-02 ENCOUNTER — Ambulatory Visit (INDEPENDENT_AMBULATORY_CARE_PROVIDER_SITE_OTHER): Payer: 59 | Admitting: Otolaryngology

## 2015-10-02 DIAGNOSIS — R590 Localized enlarged lymph nodes: Secondary | ICD-10-CM | POA: Diagnosis not present

## 2015-10-09 ENCOUNTER — Ambulatory Visit (INDEPENDENT_AMBULATORY_CARE_PROVIDER_SITE_OTHER): Payer: 59 | Admitting: Family Medicine

## 2015-10-09 ENCOUNTER — Encounter: Payer: Self-pay | Admitting: Family Medicine

## 2015-10-09 VITALS — BP 114/76 | Ht 68.0 in | Wt 163.0 lb

## 2015-10-09 DIAGNOSIS — R252 Cramp and spasm: Secondary | ICD-10-CM | POA: Diagnosis not present

## 2015-10-09 DIAGNOSIS — R6883 Chills (without fever): Secondary | ICD-10-CM

## 2015-10-09 DIAGNOSIS — M791 Myalgia, unspecified site: Secondary | ICD-10-CM

## 2015-10-09 DIAGNOSIS — Z23 Encounter for immunization: Secondary | ICD-10-CM | POA: Diagnosis not present

## 2015-10-09 DIAGNOSIS — R5383 Other fatigue: Secondary | ICD-10-CM | POA: Diagnosis not present

## 2015-10-09 DIAGNOSIS — R61 Generalized hyperhidrosis: Secondary | ICD-10-CM

## 2015-10-09 DIAGNOSIS — R590 Localized enlarged lymph nodes: Secondary | ICD-10-CM

## 2015-10-09 MED ORDER — CITALOPRAM HYDROBROMIDE 40 MG PO TABS: 40.0000 mg | ORAL_TABLET | Freq: Every day | ORAL | 1 refills | Status: DC

## 2015-10-09 NOTE — Progress Notes (Signed)
   Subjective:    Patient ID: Lisa Fields, female    DOB: [birth year], 35 y.o.   MRN: [id number]  HPIFollow up on bloodwork results. Saw Dr. Benjamine Mola and he recommended that she follow up with infectious disease.  Having neck pain, headhache, and fatigue. No better.   Has been out of citalopram for about one week. Last time pharm 20mg  instead of 40mg . She doubled up like she was told but pharm would not fill because it was too early.    Review of Systems Patient relates body aches muscle aches joint pains sweats chills she feels she runs fever but she done check her temperature if she does have night sweats all of this gone on over the past 30 days.    Objective:   Physical Exam  Lungs clear heart regular neck minimal adenopathy 18 T is benign extremities no edema skin warm dry      Assessment & Plan:  Severe fatigue tiredness not feeling good. She relates night sweats myalgias and discomforts. We will be working on getting her in with infectious disease for a second opinion. Lab work was ordered extensive. Await the results of this no further medication currently-see she is seen ENT they recommended infectious disease consult

## 2015-10-09 NOTE — Addendum Note (Signed)
Addended by: Carmelina Noun on: 10/09/2015 01:50 PM   Modules accepted: Orders

## 2015-10-10 LAB — BASIC METABOLIC PANEL
BUN/Creatinine Ratio: 13 (ref 9–23)
BUN: 9 mg/dL (ref 6–20)
CO2: 23 mmol/L (ref 18–29)
Calcium: 9.4 mg/dL (ref 8.7–10.2)
Chloride: 100 mmol/L (ref 96–106)
Creatinine, Ser: 0.7 mg/dL (ref 0.57–1.00)
GFR calc Af Amer: 130 mL/min/{1.73_m2} (ref >59)
GFR calc non Af Amer: 113 mL/min/{1.73_m2} (ref >59)
Glucose: 58 mg/dL — ABNORMAL LOW (ref 65–99)
Potassium: 4.1 mmol/L (ref 3.5–5.2)
Sodium: 140 mmol/L (ref 134–144)

## 2015-10-10 LAB — CBC WITH DIFFERENTIAL/PLATELET
Basophils Absolute: 0 10*3/uL (ref 0.0–0.2)
Basos: 0 %
EOS (ABSOLUTE): 0.1 10*3/uL (ref 0.0–0.4)
EOS: 1 %
HEMATOCRIT: 40.1 % (ref 34.0–46.6)
Hemoglobin: 13.8 g/dL (ref 11.1–15.9)
IMMATURE GRANS (ABS): 0 10*3/uL (ref 0.0–0.1)
IMMATURE GRANULOCYTES: 0 %
LYMPHS: 37 %
Lymphocytes Absolute: 2.2 10*3/uL (ref 0.7–3.1)
MCH: 32.7 pg (ref 26.6–33.0)
MCHC: 34.4 g/dL (ref 31.5–35.7)
MCV: 95 fL (ref 79–97)
MONOCYTES: 9 %
MONOS ABS: 0.5 10*3/uL (ref 0.1–0.9)
NEUTROS PCT: 53 %
Neutrophils Absolute: 3.1 10*3/uL (ref 1.4–7.0)
PLATELETS: 230 10*3/uL (ref 150–379)
RBC: 4.22 x10E6/uL (ref 3.77–5.28)
RDW: 12.7 % (ref 12.3–15.4)
WBC: 5.9 10*3/uL (ref 3.4–10.8)

## 2015-10-10 LAB — RHEUMATOID FACTOR

## 2015-10-10 LAB — MAGNESIUM: MAGNESIUM: 2.4 mg/dL — AB (ref 1.6–2.3)

## 2015-10-10 LAB — TSH: TSH: 2.04 u[IU]/mL (ref 0.450–4.500)

## 2015-10-10 LAB — B. BURGDORFI ANTIBODIES: Lyme IgG/IgM Ab: <0.91 {ISR} (ref 0.00–0.90)

## 2015-10-10 LAB — SEDIMENTATION RATE: Sed Rate: 2 mm/hr (ref 0–32)

## 2015-10-10 LAB — CMV ANTIBODY, IGG (EIA): CMV Ab - IgG: 5.8 U/mL — ABNORMAL HIGH (ref 0.00–0.59)

## 2015-10-10 LAB — ANA: Anti Nuclear Antibody(ANA): NEGATIVE

## 2015-10-22 NOTE — Addendum Note (Signed)
Addended by: Dairl Ponder on: 10/22/2015 11:13 AM   Modules accepted: Orders

## 2015-10-27 DIAGNOSIS — M791 Myalgia: Secondary | ICD-10-CM | POA: Diagnosis not present

## 2015-10-27 DIAGNOSIS — R590 Localized enlarged lymph nodes: Secondary | ICD-10-CM | POA: Diagnosis not present

## 2015-10-27 DIAGNOSIS — R6883 Chills (without fever): Secondary | ICD-10-CM | POA: Diagnosis not present

## 2015-10-27 DIAGNOSIS — R61 Generalized hyperhidrosis: Secondary | ICD-10-CM | POA: Diagnosis not present

## 2015-10-27 DIAGNOSIS — R252 Cramp and spasm: Secondary | ICD-10-CM | POA: Diagnosis not present

## 2015-10-27 DIAGNOSIS — R5383 Other fatigue: Secondary | ICD-10-CM | POA: Diagnosis not present

## 2015-10-28 LAB — CMV IGM

## 2015-11-10 ENCOUNTER — Encounter: Payer: Self-pay | Admitting: Infectious Disease

## 2015-11-10 ENCOUNTER — Ambulatory Visit (INDEPENDENT_AMBULATORY_CARE_PROVIDER_SITE_OTHER): Payer: 59 | Admitting: Infectious Disease

## 2015-11-10 VITALS — BP 111/74 | HR 88 | Temp 97.9°F | Wt 164.0 lb

## 2015-11-10 DIAGNOSIS — G9331 Postviral fatigue syndrome: Secondary | ICD-10-CM

## 2015-11-10 DIAGNOSIS — F17200 Nicotine dependence, unspecified, uncomplicated: Secondary | ICD-10-CM

## 2015-11-10 DIAGNOSIS — M791 Myalgia, unspecified site: Secondary | ICD-10-CM

## 2015-11-10 DIAGNOSIS — R5383 Other fatigue: Secondary | ICD-10-CM

## 2015-11-10 DIAGNOSIS — G933 Postviral fatigue syndrome: Secondary | ICD-10-CM | POA: Diagnosis not present

## 2015-11-10 DIAGNOSIS — M797 Fibromyalgia: Secondary | ICD-10-CM | POA: Diagnosis not present

## 2015-11-10 DIAGNOSIS — F331 Major depressive disorder, recurrent, moderate: Secondary | ICD-10-CM | POA: Diagnosis not present

## 2015-11-10 DIAGNOSIS — I889 Nonspecific lymphadenitis, unspecified: Secondary | ICD-10-CM

## 2015-11-10 DIAGNOSIS — R5382 Chronic fatigue, unspecified: Secondary | ICD-10-CM

## 2015-11-10 HISTORY — DX: Fibromyalgia: M79.7

## 2015-11-10 HISTORY — DX: Nicotine dependence, unspecified, uncomplicated: F17.200

## 2015-11-10 HISTORY — DX: Chronic fatigue, unspecified: R53.82

## 2015-11-10 HISTORY — DX: Other fatigue: R53.83

## 2015-11-10 HISTORY — DX: Myalgia, unspecified site: M79.10

## 2015-11-10 HISTORY — DX: Nonspecific lymphadenitis, unspecified: I88.9

## 2015-11-10 NOTE — Progress Notes (Signed)
Reason for Consult: Cervical lymphadenopathy and maliase, myalgias  Requesting Physician: Dr. Wolfgang Phoenix  Subjective:    Patient ID: Lisa Fields, female    DOB: 04-19-1980, 35 y.o.   MRN: [id number]  HPI  35 year old with history of migraine headaches who apparently developed cervical lymphadenopathy in July into August. She made multiple trips to her PCP to ER and was even seen by ENT who told her she had a reactive lymphadenopathy.  Her HIV was negative.   Her CMV titers done in September were c/w past infection which may be due to more remote infection vs the period when she had the lympadenopathy. She was monospot negative and had prior hx of mono.  For some reason Lyme antibodies checked --which should not have been done since she did not fit any syndrome c/w Lyme but they were negative.   She did have Bartonella ab checked and these were positive and she did sustain a scratch from a stray cat but on her leg not near her face or neck and she did not have regional LA near scratch  The majority of her current symptoms are centered around chronic fatigue, myalgias in calves, lower back. She also is complaining of a "sinus infection" and was wearing a mask on her face lying with her head on the counter when I went into the exam room.   Past Medical History:  Diagnosis Date  . Bronchitis   . Chronic headaches   . Fatigue 11/10/2015  . Myalgia 11/10/2015  . Ovarian cyst     Past Surgical History:  Procedure Laterality Date  . APPENDECTOMY    . OVARIAN CYST SURGERY    . TUBAL LIGATION      No family history on file.    Social History   Social History  . Marital status: Married    Spouse name: N/A  . Number of children: N/A  . Years of education: N/A   Social History Main Topics  . Smoking status: Current Every Day Smoker    Packs/day: 0.50    Types: Cigarettes    Start date: 07/23/2013  . Smokeless tobacco: Never Used  . Alcohol use No  . Drug use: No  .  Sexual activity: Yes    Birth control/ protection: Surgical   Other Topics Concern  . None   Social History Narrative  . None    Allergies  Allergen Reactions  . Ivp Dye [Iodinated Diagnostic Agents] Shortness Of Breath and Rash  . Sulfa Antibiotics Nausea And Vomiting  . Tuna [Fish Allergy] Shortness Of Breath and Swelling  . Cherry Hives  . Codeine Other (See Comments)    "I'm Delusional"  . Dill Oil Hives  . Latex Hives  . Morphine And Related Other (See Comments)    "bottoms out my blood pressure"  . Topamax [Topiramate]     Hot flashes, leg pain  . Vicodin [Hydrocodone-Acetaminophen] Other (See Comments)    Hallucinations: "I see spiders"     Current Outpatient Prescriptions:  .  aspirin-acetaminophen-caffeine (EXCEDRIN MIGRAINE) 250-250-65 MG tablet, Take 2 tablets by mouth every 6 (six) hours as needed for headache., Disp: , Rfl:  .  Cholecalciferol (VITAMIN D PO), Take by mouth., Disp: , Rfl:  .  citalopram (CELEXA) 40 MG tablet, Take 1 tablet (40 mg total) by mouth daily., Disp: 90 tablet, Rfl: 1 .  medroxyPROGESTERone (DEPO-PROVERA) 150 MG/ML injection, Inject 150 mg into the muscle every 3 (three) months., Disp: , Rfl:  .  Multiple Vitamin (MULTIVITAMIN) tablet, Take 1 tablet by mouth daily., Disp: , Rfl:  .  naproxen sodium (ALEVE) 220 MG tablet, Take 660 mg by mouth every morning., Disp: , Rfl:  .  vitamin B-12 (CYANOCOBALAMIN) 1000 MCG tablet, Take 1,000 mcg by mouth 2 (two) times daily., Disp: , Rfl:    Review of Systems  Constitutional: Positive for fatigue. Negative for chills, diaphoresis and fever.  HENT: Positive for congestion, postnasal drip, rhinorrhea, sinus pressure, sneezing and sore throat. Negative for hearing loss and tinnitus.   Respiratory: Negative for cough, shortness of breath and wheezing.   Cardiovascular: Negative for chest pain, palpitations and leg swelling.  Gastrointestinal: Negative for abdominal pain, blood in stool, constipation,  diarrhea, nausea and vomiting.  Genitourinary: Negative for dysuria, flank pain and hematuria.  Musculoskeletal: Positive for arthralgias and back pain. Negative for myalgias.  Skin: Negative for rash.  Neurological: Positive for headaches. Negative for dizziness and weakness.  Hematological: Does not bruise/bleed easily.  Psychiatric/Behavioral: Positive for confusion, decreased concentration and dysphoric mood. Negative for suicidal ideas. The patient is not nervous/anxious.        Objective:   Physical Exam  Constitutional: She is oriented to person, place, and time. She appears well-developed and well-nourished. No distress.  HENT:  Head: Normocephalic and atraumatic.  Mouth/Throat: Oropharynx is clear and moist. No oropharyngeal exudate.  Eyes: Conjunctivae and EOM are normal. No scleral icterus.  Neck: Normal range of motion. Neck supple.  Cardiovascular: Normal rate, regular rhythm and normal heart sounds.  Exam reveals no gallop and no friction rub.   No murmur heard. Pulmonary/Chest: Effort normal and breath sounds normal. No respiratory distress. She has no wheezes. She has no rales.  Abdominal: Soft. She exhibits no distension. There is no tenderness.  Musculoskeletal: She exhibits tenderness. She exhibits no edema.       Arms:      Legs: Lymphadenopathy:       Head (right side): No submental, no submandibular, no tonsillar, no preauricular, no posterior auricular and no occipital adenopathy present.       Head (left side): No submental, no submandibular, no tonsillar, no preauricular, no posterior auricular and no occipital adenopathy present.    She has no cervical adenopathy.  Neurological: She is alert and oriented to person, place, and time. She exhibits normal muscle tone. Coordination normal.  Skin: Skin is warm and dry. No rash noted. She is not diaphoretic. No erythema. No pallor.  Psychiatric: Her speech is normal and behavior is normal. Judgment and thought  content normal. She exhibits a depressed mood.          Assessment & Plan:   Malaise and fatigue with myalgias and prior cervical LA:  This conceivably be a post viral syndrome but by no means is an active infectious disease  More likely this patient suffers from chronic fatigue and or fibromyalgia  Cervical LA: she does not have any adenopathy now  Bartonella titers +: c/w Cat scratch several months ago but not an indication for need for treatment  Smoking: needs to stop  Sinus congestion: viral URI, symptomatic rx  Depression: says her celexa cannot be tolerated now that she became ill  RTC PRN   I spent greater than 40 minutes with the patient including greater than 50% of time in face to face counsel of the patient re her prior cervical lymphadenopathy, fatigue, malaise, myalgias, depression URI, and smoking and in coordination of her care.

## 2015-11-12 MED FILL — CITALOPRAM HBR 40 MG TABLET: 40 | 90 days supply | Qty: 90 | Fill #0

## 2015-11-17 ENCOUNTER — Ambulatory Visit: Payer: Self-pay | Admitting: Internal Medicine

## 2015-11-18 ENCOUNTER — Ambulatory Visit: Payer: 59

## 2015-12-09 ENCOUNTER — Encounter: Payer: Self-pay | Admitting: Family Medicine

## 2015-12-09 ENCOUNTER — Ambulatory Visit (INDEPENDENT_AMBULATORY_CARE_PROVIDER_SITE_OTHER): Payer: 59 | Admitting: Family Medicine

## 2015-12-09 VITALS — BP 100/62 | Temp 98.3°F | Ht 68.0 in | Wt 166.4 lb

## 2015-12-09 DIAGNOSIS — J310 Chronic rhinitis: Secondary | ICD-10-CM

## 2015-12-09 DIAGNOSIS — J329 Chronic sinusitis, unspecified: Secondary | ICD-10-CM

## 2015-12-09 DIAGNOSIS — J209 Acute bronchitis, unspecified: Secondary | ICD-10-CM

## 2015-12-09 MED ORDER — ALBUTEROL SULFATE HFA 108 (90 BASE) MCG/ACT IN AERS: 2.0000 | INHALATION_SPRAY | Freq: Four times a day (QID) | RESPIRATORY_TRACT | 0 refills | Status: DC | PRN

## 2015-12-09 MED ORDER — CLARITHROMYCIN 500 MG PO TABS: 500.0000 mg | ORAL_TABLET | Freq: Two times a day (BID) | ORAL | 0 refills | Status: DC

## 2015-12-09 MED ORDER — BENZONATATE 100 MG PO CAPS: 100.0000 mg | ORAL_CAPSULE | Freq: Three times a day (TID) | ORAL | 0 refills | Status: DC | PRN

## 2015-12-09 NOTE — Progress Notes (Signed)
   Subjective:    Patient ID: AMANDY ZIENTARA, female    DOB: [birth year], 35 y.o.   MRN: [id number]  Cough  This is a new problem. The current episode started 1 to 4 weeks ago. Associated symptoms include a fever, nasal congestion and a sore throat. Treatments tried: mucinex,theraflu,robitussin.   First week sinus symtoms and watery eyes and miucinex sinus stuff  Prod couugh, no hx of inhaler use     Review of Systems  Constitutional: Positive for fever.  HENT: Positive for sore throat.   Respiratory: Positive for cough.        Objective:   Physical Exam Alert, mild malaise. Hydration good Vitals stable. frontal/ maxillary tenderness evident positive nasal congestion. pharynx normal neck supple  lungs clear/no crackles  wheezes. heart regular in rhythm        Assessment & Plan:  Some positive  impression 1 positive bronchitis/sinusitis with reactive airways, gated by patient's chronic smoking antibiotics prescribed. Albuterol 2 sprays every 4-6 when necessary. Tessalon Perles for cough symptom care discussed encouraged to stop smoking

## 2016-02-13 ENCOUNTER — Encounter (HOSPITAL_COMMUNITY): Payer: Self-pay | Admitting: Emergency Medicine

## 2016-02-13 ENCOUNTER — Emergency Department (HOSPITAL_COMMUNITY)
Admission: EM | Admit: 2016-02-13 | Discharge: 2016-02-13 | Disposition: A | Discharge status: Home / Self Care | Payer: 59 | Attending: Dermatology | Admitting: Dermatology

## 2016-02-13 DIAGNOSIS — Y990 Civilian activity done for income or pay: Secondary | ICD-10-CM | POA: Insufficient documentation

## 2016-02-13 DIAGNOSIS — Z79899 Other long term (current) drug therapy: Secondary | ICD-10-CM | POA: Diagnosis not present

## 2016-02-13 DIAGNOSIS — X509XXA Other and unspecified overexertion or strenuous movements or postures, initial encounter: Secondary | ICD-10-CM | POA: Diagnosis not present

## 2016-02-13 DIAGNOSIS — S3992XA Unspecified injury of lower back, initial encounter: Secondary | ICD-10-CM | POA: Insufficient documentation

## 2016-02-13 DIAGNOSIS — F17210 Nicotine dependence, cigarettes, uncomplicated: Secondary | ICD-10-CM | POA: Diagnosis not present

## 2016-02-13 DIAGNOSIS — Y929 Unspecified place or not applicable: Secondary | ICD-10-CM | POA: Insufficient documentation

## 2016-02-13 DIAGNOSIS — Y9389 Activity, other specified: Secondary | ICD-10-CM | POA: Diagnosis not present

## 2016-02-13 LAB — POC URINE PREG, ED: PREG TEST UR: NEGATIVE

## 2016-02-13 NOTE — ED Triage Notes (Signed)
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 Pain radiate down back of legs towards knees.

## 2016-02-14 ENCOUNTER — Encounter (HOSPITAL_COMMUNITY): Payer: Self-pay | Admitting: Emergency Medicine

## 2016-02-14 ENCOUNTER — Emergency Department (HOSPITAL_COMMUNITY)
Admission: EM | Admit: 2016-02-14 | Discharge: 2016-02-14 | Disposition: A | Discharge status: Home / Self Care | Payer: PRIVATE HEALTH INSURANCE | Attending: Emergency Medicine | Admitting: Emergency Medicine

## 2016-02-14 ENCOUNTER — Emergency Department (HOSPITAL_COMMUNITY): Payer: PRIVATE HEALTH INSURANCE

## 2016-02-14 DIAGNOSIS — Y9389 Activity, other specified: Secondary | ICD-10-CM | POA: Diagnosis not present

## 2016-02-14 DIAGNOSIS — F17210 Nicotine dependence, cigarettes, uncomplicated: Secondary | ICD-10-CM | POA: Diagnosis not present

## 2016-02-14 DIAGNOSIS — Y990 Civilian activity done for income or pay: Secondary | ICD-10-CM | POA: Insufficient documentation

## 2016-02-14 DIAGNOSIS — Y929 Unspecified place or not applicable: Secondary | ICD-10-CM | POA: Insufficient documentation

## 2016-02-14 DIAGNOSIS — S3992XA Unspecified injury of lower back, initial encounter: Secondary | ICD-10-CM | POA: Diagnosis present

## 2016-02-14 DIAGNOSIS — M5442 Lumbago with sciatica, left side: Secondary | ICD-10-CM

## 2016-02-14 DIAGNOSIS — X501XXA Overexertion from prolonged static or awkward postures, initial encounter: Secondary | ICD-10-CM | POA: Insufficient documentation

## 2016-02-14 DIAGNOSIS — M545 Low back pain: Secondary | ICD-10-CM | POA: Diagnosis not present

## 2016-02-14 MED ORDER — PREDNISONE 50 MG PO TABS
60.0000 mg | ORAL_TABLET | Freq: Once | ORAL | Status: AC
  Administered 2016-02-14: 08:00:00 60 mg via ORAL
  Filled 2016-02-14: qty 1

## 2016-02-14 MED ORDER — PREDNISONE 20 MG PO TABS: ORAL_TABLET | ORAL | 0 refills | Status: DC

## 2016-02-14 MED ORDER — TRAMADOL HCL 50 MG PO TABS: 50.0000 mg | ORAL_TABLET | Freq: Four times a day (QID) | ORAL | 0 refills | Status: DC | PRN

## 2016-02-14 MED ORDER — TRAMADOL HCL 50 MG PO TABS
50.0000 mg | ORAL_TABLET | Freq: Once | ORAL | Status: AC
  Administered 2016-02-14: 50 mg via ORAL
  Filled 2016-02-14: qty 1

## 2016-02-14 NOTE — ED Provider Notes (Signed)
[facility] DEPT Provider Note   CSN: [id number] Arrival date & time: 02/14/16  0711     History   Chief Complaint Chief Complaint  Patient presents with  . Back Pain    HPI Lisa Fields is a 36 y.o. female.  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 Pain has been severe, persistent, and radiates down left leg. No loss of sensation or weakness in leg. No saddle anesthesia. No gi or gu c/o. Denies abd or flank pain. No fever or chills. No hx chronic back pain or ddd.    The history is provided by the patient.  Back Pain   Pertinent negatives include no fever, no numbness, no abdominal pain, no dysuria and no weakness.    Past Medical History:  Diagnosis Date  . Bronchitis   . Cervical lymphadenitis 11/10/2015  . Chronic fatigue 11/10/2015  . Chronic headaches   . Fatigue 11/10/2015  . Fibromyalgia 11/10/2015  . Myalgia 11/10/2015  . Ovarian cyst   . Smoker 11/10/2015    Patient Active Problem List   Diagnosis Date Noted  . Fatigue 11/10/2015  . Myalgia 11/10/2015  . Fibromyalgia 11/10/2015  . Cervical lymphadenitis 11/10/2015  . Smoker 11/10/2015  . Chronic fatigue 11/10/2015  . Menorrhagia with irregular cycle 10/17/2014  . Depression 08/27/2014    Past Surgical History:  Procedure Laterality Date  . APPENDECTOMY    . OVARIAN CYST SURGERY    . TUBAL LIGATION      OB History    Gravida Para Term Preterm AB Living             2   SAB TAB Ectopic Multiple Live Births                   Home Medications    Prior to Admission medications   Medication Sig Start Date End Date Taking? Authorizing Provider  albuterol (PROVENTIL HFA;VENTOLIN HFA) 108 (90 Base) MCG/ACT inhaler Inhale 2 puffs into the lungs every 6 (six) hours as needed for wheezing or shortness of breath. 12/09/15   Mikey Kirschner, MD  aspirin-acetaminophen-caffeine  (EXCEDRIN MIGRAINE) [phone] MG tablet Take 2 tablets by mouth every 6 (six) hours as needed for headache.    Historical Provider, MD  benzonatate (TESSALON) 100 MG capsule Take 1 capsule (100 mg total) by mouth 3 (three) times daily as needed for cough. 12/09/15   Mikey Kirschner, MD  Cholecalciferol (VITAMIN D PO) Take by mouth.    Historical Provider, MD  citalopram (CELEXA) 40 MG tablet Take 1 tablet (40 mg total) by mouth daily. 10/09/15   Kathyrn Drown, MD  clarithromycin (BIAXIN) 500 MG tablet Take 1 tablet (500 mg total) by mouth 2 (two) times daily. 12/09/15   Mikey Kirschner, MD  medroxyPROGESTERone (DEPO-PROVERA) 150 MG/ML injection Inject 150 mg into the muscle every 3 (three) months.    Historical Provider, MD  Multiple Vitamin (MULTIVITAMIN) tablet Take 1 tablet by mouth daily.    Historical Provider, MD  naproxen sodium (ALEVE) 220 MG tablet Take 660 mg by mouth every morning.    Historical Provider, MD  vitamin B-12 (CYANOCOBALAMIN) 1000 MCG tablet Take 1,000 mcg by mouth 2 (two) times daily.    Historical Provider, MD    Family History History reviewed. No pertinent family history.  Social History Social History  Substance Use Topics  .  Smoking status: Current Every Day Smoker    Packs/day: 0.50    Types: Cigarettes    Start date: 07/23/2013  . Smokeless tobacco: Never Used  . Alcohol use No     Allergies   Ivp dye [iodinated diagnostic agents]; Sulfa antibiotics; [place] [fish allergy]; Cherry; Codeine; Dill oil; Latex; Morphine and related; Topamax [topiramate]; and Vicodin [hydrocodone-acetaminophen]   Review of Systems Review of Systems  Constitutional: Negative for chills and fever.  Gastrointestinal: Negative for abdominal pain.  Genitourinary: Negative for dysuria.       No incontinence or retention.   Musculoskeletal: Positive for back pain.  Neurological: Negative for weakness and numbness.     Physical Exam Updated Vital Signs BP 111/79 (BP  Location: Left Arm)   Pulse 81   Temp 98 F (36.7 C) (Oral)   Resp 18   Ht 5\' 8"  (1.727 m)   Wt 77.1 kg   LMP 01/23/2016   SpO2 99%   BMI 25.85 kg/m   Physical Exam  Constitutional: She appears well-developed and well-nourished. No distress.  Eyes: Conjunctivae are normal. No scleral icterus.  Neck: Neck supple. No tracheal deviation present.  Cardiovascular: Normal rate and intact distal pulses.   Pulmonary/Chest: Effort normal. No respiratory distress.  Abdominal: Normal appearance.  Musculoskeletal: She exhibits no edema.  Mid to lower umbar tenderness, otherwise, CTLS spine, non tender, aligned, no step off. Good rom left hip and knee without pain, distal pulses palp.    Neurological: She is alert.  Straight leg raise pos on left. Motor intact bil, stre 5/5. sens grossly intact. Patellar/ach reflexes 2+. Steady gait.   Skin: Skin is warm and dry. No rash noted. She is not diaphoretic.  Psychiatric: She has a normal mood and affect.  Nursing note and vitals reviewed.    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 Left lower back pain. EXAM: LUMBAR SPINE - COMPLETE 4+ VIEW COMPARISON:  None. FINDINGS: Mild thoracolumbar curvature. Mild disc space narrowing L4-5 and L5-S1. No evidence of facet arthropathy or pars defect. No evidence of fracture. Sacroiliac joints appear normal. IMPRESSION: No acute or traumatic finding. Mild spinal curvature. Mild disc space narrowing L4-5 and L5-S1. Electronically Signed   By: Nelson Chimes M.D.   On: 02/14/2016 08:38    Procedures Procedures (including critical care time)  Medications Ordered in ED Medications  traMADol (ULTRAM) tablet 50 mg (50 mg Oral Given 02/14/16 0759)  predniSONE  (DELTASONE) tablet 60 mg (60 mg Oral Given 02/14/16 0759)     Initial Impression / Assessment and Plan / ED Course  I have reviewed the triage vital signs and the nursing notes.  Pertinent labs & imaging results that were available during my care of the patient were reviewed by me and considered in my medical decision making (see chart for details).  Pt has ride, does not have to drive.   Ultram po. Prednisone po.  Xrays.  Recheck, pt appears more comfortable.   Patient currently appears stable for d/c.     Final Clinical Impressions(s) / ED Diagnoses   Final diagnoses:  None    New Prescriptions New Prescriptions   No medications on file     Lajean Saver, MD 02/14/16  0844  

## 2016-02-14 NOTE — Discharge Instructions (Signed)
It was our pleasure to provide your ER care today - we hope that you feel better.  Rest. Avoid heavy lifting > 20 lbs, or bending at waist, for the next week.   May try heat and/or gentle massage to sore area.  Take tylenol as need for pain.  Take prednisone as prescribed.   If need additional pain medication, you may take ultram as need for pain - no driving when taking.   Follow up with primary care doctor in the next 1-2 weeks for recheck if symptoms fail to improve/resolve.  Return to ER if worse, new symptoms, loss of sensation/weakness, intractable pain, other concern.   You were given pain medication in the ER - no driving for the next 4 hours.

## 2016-02-14 NOTE — ED Triage Notes (Signed)
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 PT ambulatory from triage.

## 2016-02-16 ENCOUNTER — Emergency Department (HOSPITAL_COMMUNITY)
Admission: EM | Admit: 2016-02-16 | Discharge: 2016-02-16 | Disposition: A | Discharge status: Home / Self Care | Payer: PRIVATE HEALTH INSURANCE | Attending: Emergency Medicine | Admitting: Emergency Medicine

## 2016-02-16 ENCOUNTER — Encounter (HOSPITAL_COMMUNITY): Payer: Self-pay

## 2016-02-16 ENCOUNTER — Emergency Department (HOSPITAL_COMMUNITY): Payer: PRIVATE HEALTH INSURANCE

## 2016-02-16 DIAGNOSIS — F17210 Nicotine dependence, cigarettes, uncomplicated: Secondary | ICD-10-CM | POA: Insufficient documentation

## 2016-02-16 DIAGNOSIS — Z79899 Other long term (current) drug therapy: Secondary | ICD-10-CM | POA: Diagnosis not present

## 2016-02-16 DIAGNOSIS — Y929 Unspecified place or not applicable: Secondary | ICD-10-CM | POA: Insufficient documentation

## 2016-02-16 DIAGNOSIS — Z91040 Latex allergy status: Secondary | ICD-10-CM | POA: Insufficient documentation

## 2016-02-16 DIAGNOSIS — Y999 Unspecified external cause status: Secondary | ICD-10-CM | POA: Diagnosis not present

## 2016-02-16 DIAGNOSIS — Y9389 Activity, other specified: Secondary | ICD-10-CM | POA: Diagnosis not present

## 2016-02-16 DIAGNOSIS — M545 Low back pain: Secondary | ICD-10-CM | POA: Diagnosis present

## 2016-02-16 DIAGNOSIS — M5442 Lumbago with sciatica, left side: Secondary | ICD-10-CM | POA: Insufficient documentation

## 2016-02-16 DIAGNOSIS — M549 Dorsalgia, unspecified: Secondary | ICD-10-CM

## 2016-02-16 DIAGNOSIS — X501XXA Overexertion from prolonged static or awkward postures, initial encounter: Secondary | ICD-10-CM | POA: Insufficient documentation

## 2016-02-16 DIAGNOSIS — Z7982 Long term (current) use of aspirin: Secondary | ICD-10-CM | POA: Insufficient documentation

## 2016-02-16 LAB — URINALYSIS, ROUTINE W REFLEX MICROSCOPIC
BILIRUBIN URINE: NEGATIVE
GLUCOSE, UA: NEGATIVE mg/dL
HGB URINE DIPSTICK: NEGATIVE
Ketones, ur: NEGATIVE mg/dL
Leukocytes, UA: NEGATIVE
Nitrite: NEGATIVE
PH: 6 (ref 5.0–8.0)
Protein, ur: NEGATIVE mg/dL
SPECIFIC GRAVITY, URINE: 1.011 (ref 1.005–1.030)

## 2016-02-16 NOTE — Discharge Instructions (Signed)
Continue to use tramadol as needed for pain. Additionally, you may take Tylenol or ibuprofen as well.  Your MRI showed disc protrusion at L4-L5. This may be causing your symptoms and hopefully will improve over time. However, it will be extremely important that you make quick follow-up with your primary care physician and orthopedic surgeon.Please make an appointment to see your primary care physician as soon as possible. Additionally, please make an appointment to see the orthopedic surgeon as soon as possible.

## 2016-02-16 NOTE — ED Provider Notes (Signed)
[facility] DEPT Provider Note   CSN: [id number] Arrival date & time: 02/16/16  1541     History   Chief Complaint Chief Complaint  Patient presents with  . Back Pain    HPI Lisa Fields is a 36 y.o. female.  HPI Patient is a 36 year old female with a medical history of chronic fatigue, chronic headaches, fibromyalgia and tobacco abuse who presents with back pain. Importantly, patient recently seen in the emergency department on 02/14/2016 complaining of low back pain after work-related injury occurring on 02/12/2016.Patient was at the [facility] today and told the physician that she was incontinent of urine. He sent her to the emergency department for further evaluation.She continues to complain of severe lumbar back pain with radiation to the left butt and thigh area and she has associated numbness and tingling of her left foot. She also states that since the injury she has had several episodes of urinary incontinence. She notes she'll feel a pressure and had urinary incontinence. Other times she is able to control her bladder function. She denies saddle anesthesia but does endorse a feeling of "fullness". She is also endorsing polyuria. Her pain is improved with lying flat or the pillow between her legs. She denies chest pain or short of breath. She denies nausea, vomiting or abdominal pain. She denies diarrhea or constipation. She denies fevers or chills. She has no additional acute complaints or concerns today. Past Medical History:  Diagnosis Date  . Bronchitis   . Cervical lymphadenitis 11/10/2015  . Chronic fatigue 11/10/2015  . Chronic headaches   . Fatigue 11/10/2015  . Fibromyalgia 11/10/2015  . Myalgia 11/10/2015  . Ovarian cyst   . Smoker 11/10/2015    Patient Active Problem List   Diagnosis Date Noted  . Fatigue 11/10/2015  . Myalgia 11/10/2015  . Fibromyalgia 11/10/2015  . Cervical lymphadenitis 11/10/2015  . Smoker 11/10/2015  .  Chronic fatigue 11/10/2015  . Menorrhagia with irregular cycle 10/17/2014  . Depression 08/27/2014    Past Surgical History:  Procedure Laterality Date  . APPENDECTOMY    . OVARIAN CYST SURGERY    . TUBAL LIGATION      OB History    Gravida Para Term Preterm AB Living             2   SAB TAB Ectopic Multiple Live Births                   Home Medications    Prior to Admission medications   Medication Sig Start Date End Date Taking? Authorizing Provider  albuterol (PROVENTIL HFA;VENTOLIN HFA) 108 (90 Base) MCG/ACT inhaler Inhale 2 puffs into the lungs every 6 (six) hours as needed for wheezing or shortness of breath. 12/09/15   Mikey Kirschner, MD  aspirin-acetaminophen-caffeine (EXCEDRIN MIGRAINE) [phone] MG tablet Take 2 tablets by mouth every 6 (six) hours as needed for headache.    Historical Provider, MD  benzonatate (TESSALON) 100 MG capsule Take 1 capsule (100 mg total) by mouth 3 (three) times daily as needed for cough. 12/09/15   Mikey Kirschner, MD  Cholecalciferol (VITAMIN D PO) Take by mouth.    Historical Provider, MD  citalopram (CELEXA) 40 MG tablet Take 1 tablet (40 mg total) by mouth daily. 10/09/15   Kathyrn Drown, MD  clarithromycin (BIAXIN) 500 MG tablet Take 1 tablet (500 mg total) by mouth 2 (two) times daily. 12/09/15   Mikey Kirschner, MD  medroxyPROGESTERone (DEPO-PROVERA) 150 MG/ML injection Inject  150 mg into the muscle every 3 (three) months.    Historical Provider, MD  Multiple Vitamin (MULTIVITAMIN) tablet Take 1 tablet by mouth daily.    Historical Provider, MD  naproxen sodium (ALEVE) 220 MG tablet Take 660 mg by mouth every morning.    Historical Provider, MD  predniSONE (DELTASONE) 20 MG tablet 3 po once a day for 2 days, then 2 po once a day for 3 days, then 1 po once a day for 3 days 02/14/16   Lajean Saver, MD  traMADol (ULTRAM) 50 MG tablet Take 1 tablet (50 mg total) by mouth every 6 (six) hours as needed. 02/14/16   Lajean Saver, MD    vitamin B-12 (CYANOCOBALAMIN) 1000 MCG tablet Take 1,000 mcg by mouth 2 (two) times daily.    Historical Provider, MD    Family History History reviewed. No pertinent family history.  Social History Social History  Substance Use Topics  . Smoking status: Current Every Day Smoker    Packs/day: 0.50    Types: Cigarettes    Start date: 07/23/2013  . Smokeless tobacco: Never Used  . Alcohol use No     Allergies   Ivp dye [iodinated diagnostic agents]; Sulfa antibiotics; [place] [fish allergy]; Cherry; Codeine; Dill oil; Latex; Morphine and related; Topamax [topiramate]; and Vicodin [hydrocodone-acetaminophen]   Review of Systems Review of Systems  All other systems reviewed and are negative.    Physical Exam Updated Vital Signs BP 107/78 (BP Location: Left Arm)   Pulse 77   Temp 97.9 F (36.6 C) (Oral)   Resp 18   LMP 01/23/2016   SpO2 100%   Physical Exam  Constitutional: She is oriented to person, place, and time. She appears well-developed.  HENT:  Head: Normocephalic and atraumatic.  Cardiovascular: Normal rate and regular rhythm.  Exam reveals no gallop and no friction rub.   No murmur heard. Pulmonary/Chest: Breath sounds normal. No respiratory distress. She has no wheezes.  Abdominal: Soft. Bowel sounds are normal. She exhibits no distension.  Musculoskeletal: She exhibits tenderness.  Midline tenderness in the lumbar spine. No paraspinal tenderness. No bony step-offs appreciated.  Neurological: She is alert and oriented to person, place, and time.     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 Severe back pain radiating to LEFT lower extremity. Urinary  incontinence. EXAM: MRI LUMBAR SPINE WITHOUT CONTRAST TECHNIQUE: Multiplanar, multisequence MR imaging of the lumbar spine was performed. No intravenous contrast was administered. COMPARISON:  Lumbar spine radiographs February 22, 2016 FINDINGS: Mild to moderately motion degraded examination. SEGMENTATION: For the purposes of this report, the last well-formed intervertebral disc will be described as L5-S1. ALIGNMENT: Maintenance of the lumbar lordosis. No malalignment. VERTEBRAE:Vertebral bodies are intact. Mild L4-5 disc height loss and desiccation. Scattered old Schmorl's nodes. No bone marrow signal abnormality to suggest acute osseous process. CONUS MEDULLARIS: Conus medullaris terminates at T12-L1 and demonstrates normal morphology and signal characteristics. Cauda equina is normal. PARASPINAL AND SOFT TISSUES: Included prevertebral and paraspinal soft tissues are nonacute. Asymmetrically smaller LEFT psoas muscle, recommend correlation with ambulation. DISC LEVELS: L1-2, L2-3 L3-4: No disc bulge, canal stenosis nor neural foraminal narrowing. Mild L3-4  facet arthropathy. L4-5: 5 mm broad-based RIGHT central disc protrusion and annular fissure. Mild to moderate facet arthropathy and ligamentum flavum redundancy. No canal stenosis though partial effacement of RIGHT lateral recess could affect the traversing RIGHT L5 nerve. Minimal bilateral neural foraminal narrowing. L5-S1: Small broad-based disc bulge asymmetric to the RIGHT could affect the exited RIGHT L5 nerve. Mild facet arthropathy and ligamentum flavum redundancy without canal stenosis. Slight effacement of RIGHT lateral recess. No neural foraminal narrowing. IMPRESSION: Mild to moderately motion degraded examination. Moderate L4-5 disc protrusion annular fissure could affect the traversing RIGHT L5 nerve. No canal stenosis.  Minimal L4-5 neural foraminal narrowing. Electronically Signed   By: Elon Alas M.D.   On: 02/16/2016 20:50     Procedures Procedures (including critical care time)  Medications Ordered in ED Medications - No data to display   Initial Impression / Assessment and Plan / ED Course  I have reviewed the triage vital signs and the nursing notes.  Pertinent labs & imaging results that were available during my care of the patient were reviewed by me and considered in my medical decision making (see chart for details).  Patient presents with continued back pain after an injury. Back pain is predominantly in the lumbar region with radiation inferiorly to the left thigh and leg concerning for potential radiculopathy. Patient recently developed intermittent urinary incontinence and was sent to the emergency department from a referring provider. Given mechanism of injury, complaints of pain and new urinary incontinence there is concern for potential radiculopathy, cord compression or potentially cauda equina syndrome. Patient denies saddle anesthesia but does have systems of urinary incontinence and radicular type pain. We'll proceed with MRI lumbar spine without contrast and urinalysis to rule out potential urinary tract infection. Urinalysis negative for urinary tract infection. We'll wait for results of MRI of lumbar spine. MRI lumbar spine shows moderate L4-5 disc protrusion annular fissure which could affect the traversing right L5 nerve. No canal stenosis. This imaging study isn't consistent with her complaints of left thigh and leg pain. She does not have a current immediate need for surgery. The patient has been able to ambulate while here. Her pain is controlled with tramadol. She is currently continent of urine. I will discharge her with follow-up with her primary care physician for ongoing management. She also states that she will have an appointment scheduled with orthopedic surgery. At this time she is ready for discharge. Return precautions discussed.  Final Clinical Impressions(s) / ED Diagnoses    Final diagnoses:  Back pain  Acute left-sided low back pain with left-sided sciatica    New Prescriptions New Prescriptions   No medications on file     Ophelia Shoulder, MD 02/16/16 2145    Merrily Pew, MD 02/17/16 1251

## 2016-02-16 NOTE — ED Notes (Signed)
Pt ambulated to the bathroom without difficulty. No bladder/bowel incontinence at this time.

## 2016-02-16 NOTE — ED Notes (Signed)
Patient transported to MRI 

## 2016-02-16 NOTE — ED Notes (Signed)
E-signature not available, pt verbalized understanding of DC instructions. Steady gait noted.

## 2016-02-16 NOTE — ED Triage Notes (Signed)
Pt has lower back pain and was diagnosed with bulging disc after a work related injury. She reports she now has some tingling down her left leg and urinary incontinence with pain.

## 2016-02-27 MED FILL — CITALOPRAM HBR 40 MG TABLET: 40 | 90 days supply | Qty: 90 | Fill #1

## 2016-02-27 MED FILL — CYCLOBENZAPRINE 5 MG TABLET: 5 | 30 days supply | Qty: 60 | Fill #0

## 2016-02-27 MED FILL — METHOCARBAMOL 500 MG TABLET: 500 | 30 days supply | Qty: 60 | Fill #0

## 2016-03-16 ENCOUNTER — Ambulatory Visit (HOSPITAL_COMMUNITY): Payer: PRIVATE HEALTH INSURANCE | Attending: Orthopedic Surgery | Admitting: Physical Therapy

## 2016-03-16 DIAGNOSIS — M5416 Radiculopathy, lumbar region: Secondary | ICD-10-CM | POA: Insufficient documentation

## 2016-03-16 DIAGNOSIS — M6281 Muscle weakness (generalized): Secondary | ICD-10-CM | POA: Diagnosis present

## 2016-03-16 NOTE — Therapy (Signed)
[place], [name], [address] Phone: [phone]   Fax:  [phone]  Physical Therapy Evaluation  Patient Details  Name: Lisa Fields MRN: [id number] Date of Birth: 11/12/1980 Referring Provider: Phylliss Fields  Encounter Date: 03/16/2016      PT End of Session - 03/16/16 1619    Visit Number 1   Number of Visits 12   Date for PT Re-Evaluation 04/15/16   Authorization Type work comp    Authorization - Visit Number 1   Authorization - Number of Visits 12   PT Start Time 1523   PT Stop Time 1602   PT Time Calculation (min) 39 min   Activity Tolerance Patient tolerated treatment well   Behavior During Therapy [facility] for tasks assessed/performed      Past Medical History:  Diagnosis Date  . Bronchitis   . Cervical lymphadenitis 11/10/2015  . Chronic fatigue 11/10/2015  . Chronic headaches   . Fatigue 11/10/2015  . Fibromyalgia 11/10/2015  . Myalgia 11/10/2015  . Ovarian cyst   . Smoker 11/10/2015    Past Surgical History:  Procedure Laterality Date  . APPENDECTOMY    . OVARIAN CYST SURGERY    . TUBAL LIGATION      There were no vitals filed for this visit.       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  She has had radicular back pain down her Lt leg to her toes.  She is currently back at work wearing he back brace.  She states that she has been having tingling and buning in her leg.  She is not doing any exercises besides ROM at this time.    She is taking something to sleep but even with this she is waking up at least two times a night.    Pertinent History unremarkable    How long can you sit comfortably? sitting for 15 minutes    How long can you stand comfortably? pt states that she never stands still not because of the pain but just her nature.    How long can you walk  comfortably? a mile    Patient Stated Goals no leg symptoms, sleeping all night.    Currently in Pain? Yes   Pain Score 5   least 1/10; worst 8/10   Pain Location Back   Pain Orientation Lower   Pain Descriptors / Indicators Burning;Throbbing   Pain Type Chronic pain   Pain Radiating Towards Lt toes    Pain Onset 1 to 4 weeks ago   Pain Frequency Constant   Aggravating Factors  activity    Pain Relieving Factors lie on acupuncture mat    Effect of Pain on Daily Activities increases pain             [facility] PT Assessment - 03/16/16 0001      Assessment   Medical Diagnosis radicular back pain   Referring Provider Lisa Fields   Onset Date/Surgical Date 02/12/16   Next MD Visit 03/26/2016   Prior Therapy none     Precautions   Precautions None   Required Braces or Orthoses Spinal Brace     Restrictions   Weight Bearing Restrictions No     Balance Screen   Has the patient fallen in the past 6 months Yes   How  many times? 1   Has the patient had a decrease in activity level because of a fear of falling?  Yes   Is the patient reluctant to leave their home because of a fear of falling?  No     Home Environment   Living Environment Private residence   Type of [place] Access Stairs to enter   Entrance Stairs-Number of Steps 17   Entrance Stairs-Rails Right     Prior Function   Level of Independence Independent     Cognition   Overall Cognitive Status Within Functional Limits for tasks assessed     Observation/Other Assessments   Focus on Therapeutic Outcomes (FOTO)  31     Functional Tests   Functional tests Sit to Stand     Sit to Stand   Comments 5 x in 45.17      Posture/Postural Control   Posture/Postural Control No significant limitations     ROM / Strength   AROM / PROM / Strength AROM;Strength     AROM   AROM Assessment Site Lumbar   Lumbar Flexion 55 slightly better    Lumbar Extension 30 pain is the same      Strength   Strength  Assessment Site Hip;Knee;Ankle   Right/Left Knee -Hip deferred due to pain-  Rt normal knee extension; Lt 4+/5    Right/Left Ankle  Abdominal mm --  normal  3-/5     Palpation   Palpation comment no mm spasm or tightness is palpatable                    [facility] Adult PT Treatment/Exercise - 03/16/16 0001      Exercises   Exercises Lumbar     Lumbar Exercises: Stretches   Single Knee to Chest Stretch 3 reps;20 seconds; increases pain but flexion decreased pain.   Lower Trunk Rotation 5 reps     Lumbar Exercises: Supine   Ab Set 10 reps   Glut Set 10 reps   Bent Knee Raise 10 reps   Bridge 5 reps                PT Education - 03/16/16 1618    Education provided Yes   Person(s) Educated Patient   Methods Explanation;Handout;Demonstration   Comprehension Verbalized understanding          PT Short Term Goals - 03/16/16 1628      PT SHORT TERM GOAL #1   Title Pt to state that he radicular sx are only going down to mid thigh level to demonstrate decreased nerve irritation    Time 2   Period Weeks   Status New     PT SHORT TERM GOAL #2   Title Pt to verbalize the importance of proper body mechanics and posture has in back care    Time 2   Period Weeks   Status New     PT SHORT TERM GOAL #3   Title Pt back pain to decrease to no greater than a 5/10 to allow pt to sleep with waking a maximum of one time a night.    Time 2   Period Weeks   Status New     PT SHORT TERM GOAL #4   Title Pt ROM of back and hip to improve to allow pt to don shoes and sock with ease.    Time 2   Period Weeks   Status New  PT Long Term Goals - 03/16/16 1631      PT LONG TERM GOAL #1   Title Pt to verbalize that she is no longer having radicular sx   Time 4   Period Weeks   Status New     PT LONG TERM GOAL #2   Title Pt trunk ROM to improve to where pt is able to bend to pick items off the floor without difficulty    Time 4   Period Weeks    Status New     PT LONG TERM GOAL #3   Title Pt back pain to be no greater than a 2/10 to allow pt to sleep without waking up at night.    Time 4   Period Weeks   Status New     PT LONG TERM GOAL #4   Title Pt to have weaned away from her back brace to stop weakening of abdominal and lumbar mm.    Time 4   Period Weeks   Status New               Plan - 03/16/16 1620    Clinical Impression Statement Ms. Facemire is a 37 yo female who injured her back at work while moving a patient.  She is still having radicular symptoms to her Lt toes and has been referred to skilled physical therapy.  Examination demonstrates decreased ROM, decreased strength and decreased activity tolerance and increased pain.  She is working light duty at work.  Ms. Rudge will benefit from skilled physical therapy services to address these issues and maximize her functional abiility.     Rehab Potential Good   PT Frequency 3x / week   PT Duration 4 weeks   PT Treatment/Interventions Therapeutic activities;Therapeutic exercise;Patient/family education;Manual techniques;Ultrasound;Moist Heat   PT Next Visit Plan Begin clam, sidelying abduction, prone single leg raise, child pose and sit to stand.  Progress to standing stabilization, then work towards work simulation.  See how pt is doing as far a weaning off her lumbar support.    PT Home Exercise Plan single knee to chest, isometric gluts and abdominal, bent knee raise and bridging     Consulted and Agree with Plan of Care Patient      Patient will benefit from skilled therapeutic intervention in order to improve the following deficits and impairments:  Decreased activity tolerance, Decreased strength, Pain, Decreased range of motion  Visit Diagnosis: Radiculopathy, lumbar region - Plan: PT plan of care cert/re-cert  Muscle weakness (generalized) - Plan: PT plan of care cert/re-cert     Problem List Patient Active Problem List   Diagnosis Date Noted   . Fatigue 11/10/2015  . Myalgia 11/10/2015  . Fibromyalgia 11/10/2015  . Cervical lymphadenitis 11/10/2015  . Smoker 11/10/2015  . Chronic fatigue 11/10/2015  . Menorrhagia with irregular cycle 10/17/2014  . Depression 08/27/2014   Rayetta Humphrey, PT CLT [phone] 03/16/2016, 4:39 PM  [facility] [address] [place], [name], [address] Phone: [phone]   Fax:  [phone]  Name: Lisa Fields MRN: [id number] Date of Birth: [birth year]

## 2016-03-16 NOTE — Patient Instructions (Addendum)
Knee-to-Chest Stretch: Unilateral    With hand behind right knee, pull knee in to chest until a comfortable stretch is felt in lower back and buttocks. Keep back relaxed. Hold ___20_ seconds. Repeat _3___ times per set. Do _1___ sets per session. Do __2__ sessions per day.  http://orth.exer.us/126   Copyright  VHI. All rights reserved.  Isometric Abdominal    Lying on back with knees bent, tighten stomach by pressing elbows down. Hold __5__ seconds. Repeat ___10 times per set. Do __1__ sets per session. Do __2__ sessions per day. 2http://orth.exer.us/1086   Copyright  VHI. All rights reserved.  Bent Leg Lift (Hook-Lying)    Tighten stomach and slowly raise right leg _3___ inches from floor. Keep trunk rigid. Hold _3___ seconds. Repeat _10___ times per set. Do ____ sets per session. Do ___2_ sessions per day.  http://orth.exer.us/1090   Copyright  VHI. All rights reserved.  Bridging    Slowly raise buttocks from floor, keeping stomach tight. Repeat __10_ times per set. Do ___1_ sets per session. Do __2__ sessions per day.  http://orth.exer.us/1096   Copyright  VHI. All rights reserved.

## 2016-03-18 ENCOUNTER — Ambulatory Visit (HOSPITAL_COMMUNITY): Payer: 59 | Attending: Family Medicine

## 2016-03-18 DIAGNOSIS — M5416 Radiculopathy, lumbar region: Secondary | ICD-10-CM | POA: Diagnosis not present

## 2016-03-18 DIAGNOSIS — M6281 Muscle weakness (generalized): Secondary | ICD-10-CM

## 2016-03-18 DIAGNOSIS — S76301A Unspecified injury of muscle, fascia and tendon of the posterior muscle group at thigh level, right thigh, initial encounter: Secondary | ICD-10-CM

## 2016-03-18 DIAGNOSIS — X58XXXA Exposure to other specified factors, initial encounter: Secondary | ICD-10-CM | POA: Insufficient documentation

## 2016-03-18 NOTE — Therapy (Signed)
[place], [name], [address] Phone: [phone]   Fax:  [phone]  Physical Therapy Treatment  Patient Details  Name: Lisa Fields MRN: [id number] Date of Birth: 24-Apr-1980 Referring Provider: Phylliss Bob  Encounter Date: 03/18/2016      PT End of Session - 03/18/16 0824    Visit Number 2   Number of Visits 12   Date for PT Re-Evaluation 04/15/16   Authorization Type work comp    Authorization - Visit Number 2   Authorization - Number of Visits 12   PT Start Time 0815   PT Stop Time 0859   PT Time Calculation (min) 44 min   Activity Tolerance Patient tolerated treatment well   Behavior During Therapy [facility] for tasks assessed/performed      Past Medical History:  Diagnosis Date  . Bronchitis   . Cervical lymphadenitis 11/10/2015  . Chronic fatigue 11/10/2015  . Chronic headaches   . Fatigue 11/10/2015  . Fibromyalgia 11/10/2015  . Myalgia 11/10/2015  . Ovarian cyst   . Smoker 11/10/2015    Past Surgical History:  Procedure Laterality Date  . APPENDECTOMY    . OVARIAN CYST SURGERY    . TUBAL LIGATION      There were no vitals filed for this visit.      Subjective Assessment - 03/18/16 0818    Subjective Pt stated she is feeling good, pain free today.  Pt stated she dropped a pen yesterday, squatted down and something popped in back with immediate relief.  Pt reports compliance with HEP.   Pertinent History unremarkable    Patient Stated Goals no leg symptoms, sleeping all night.    Currently in Pain? No/denies                         [facility] Adult PT Treatment/Exercise - 03/18/16 0001      Lumbar Exercises: Seated   Sit to Stand 10 reps   Sit to Stand Limitations no HHA, cueing for eccentric control     Lumbar Exercises: Supine   Ab Set 10 reps   AB Set Limitations verbal and tactile cueing for correct activation   Clam 10 reps   Clam Limitations RTB   Bridge  10 reps   Bridge Limitations RTB     Lumbar Exercises: Sidelying   Clam 10 reps;3 seconds   Clam Limitations RTB   Hip Abduction 10 reps     Lumbar Exercises: Prone   Straight Leg Raise 10 reps   Other Prone Lumbar Exercises child's pose 3x 30" each direction                PT Education - 03/18/16 0825    Education provided Yes   Education Details Reviewed goals, assured compliance with HEP, copy of eval given to pt.  Pt educated on importance of weaning out of brace for abdominal strengthening.   Person(s) Educated Patient   Methods Explanation;Demonstration;Handout   Comprehension Verbalized understanding;Returned demonstration          PT Short Term Goals - 03/16/16 1628      PT SHORT TERM GOAL #1   Title Pt to state that he radicular sx are only going down to mid thigh level to demonstrate decreased nerve irritation    Time 2   Period Weeks   Status New     PT SHORT TERM GOAL #2   Title Pt to verbalize the  importance of proper body mechanics and posture has in back care    Time 2   Period Weeks   Status New     PT SHORT TERM GOAL #3   Title Pt back pain to decrease to no greater than a 5/10 to allow pt to sleep with waking a maximum of one time a night.    Time 2   Period Weeks   Status New     PT SHORT TERM GOAL #4   Title Pt ROM of back and hip to improve to allow pt to don shoes and sock with ease.    Time 2   Period Weeks   Status New           PT Long Term Goals - 03/16/16 1631      PT LONG TERM GOAL #1   Title Pt to verbalize that she is no longer having radicular sx   Time 4   Period Weeks   Status New     PT LONG TERM GOAL #2   Title Pt trunk ROM to improve to where pt is able to bend to pick items off the floor without difficulty    Time 4   Period Weeks   Status New     PT LONG TERM GOAL #3   Title Pt back pain to be no greater than a 2/10 to allow pt to sleep without waking up at night.    Time 4   Period Weeks   Status  New     PT LONG TERM GOAL #4   Title Pt to have weaned away from her back brace to stop weakening of abdominal and lumbar mm.    Time 4   Period Weeks   Status New               Plan - 03/18/16 [id number]    Clinical Impression Statement Reviewed goals, compliance and assured correct form/technique with HEP and copy of eval given to pt.  Reviewed importance of weaning out of brace for abdominal strengthening, pt verbalize understanding.  Reports she has reduced to 3 hours yesterday with brace and no reports of increased pain.  Session focus on improving abdominal and proximal strengthening with min cueing required for correct abdominal activaiton, proper form and technique.  No reports of increased pain through session.   Rehab Potential Good   PT Frequency 3x / week   PT Duration 4 weeks   PT Treatment/Interventions Therapeutic activities;Therapeutic exercise;Patient/family education;Manual techniques;Ultrasound;Moist Heat   PT Next Visit Plan Progress to standing stabilization, then work towards work simulation.  See how pt is doing as far a weaning off her lumbar support.    PT Home Exercise Plan single knee to chest, isometric gluts and abdominal, bent knee raise and bridging        Patient will benefit from skilled therapeutic intervention in order to improve the following deficits and impairments:  Decreased activity tolerance, Decreased strength, Pain, Decreased range of motion  Visit Diagnosis: Radiculopathy, lumbar region  Muscle weakness (generalized)  Hamstring injury, right, initial encounter     Problem List Patient Active Problem List   Diagnosis Date Noted  . Fatigue 11/10/2015  . Myalgia 11/10/2015  . Fibromyalgia 11/10/2015  . Cervical lymphadenitis 11/10/2015  . Smoker 11/10/2015  . Chronic fatigue 11/10/2015  . Menorrhagia with irregular cycle 10/17/2014  . Depression 08/27/2014   [name], [place]; [place]  [name] 03/18/2016, 5:40 PM  [place]  [facility] [address] [place], [name], [address] Phone: [phone]   Fax:  [phone]  Name: Lisa Fields MRN: [id number] Date of Birth: [birth year]

## 2016-03-19 ENCOUNTER — Ambulatory Visit (HOSPITAL_COMMUNITY): Payer: 59

## 2016-03-19 DIAGNOSIS — M5416 Radiculopathy, lumbar region: Secondary | ICD-10-CM

## 2016-03-19 DIAGNOSIS — S76301A Unspecified injury of muscle, fascia and tendon of the posterior muscle group at thigh level, right thigh, initial encounter: Secondary | ICD-10-CM | POA: Diagnosis not present

## 2016-03-19 DIAGNOSIS — M6281 Muscle weakness (generalized): Secondary | ICD-10-CM | POA: Diagnosis not present

## 2016-03-19 NOTE — Therapy (Signed)
[place] , [name], [address] Phone: [phone]   Fax:  [phone]  Physical Therapy Treatment  Patient Details  Name: Lisa Fields MRN: [id number] Date of Birth: August 09, 1980 Referring Provider: Phylliss Bob  Encounter Date: 03/19/2016      PT End of Session - 03/19/16 1610    Visit Number 3   Number of Visits 12   Date for PT Re-Evaluation 04/15/16   Authorization Type work comp    Authorization - Visit Number 3   Authorization - Number of Visits 12   PT Start Time [id number]   PT Stop Time 1650   PT Time Calculation (min) 45 min   Activity Tolerance Patient tolerated treatment well;No increased pain   Behavior During Therapy [facility] for tasks assessed/performed      Past Medical History:  Diagnosis Date  . Bronchitis   . Cervical lymphadenitis 11/10/2015  . Chronic fatigue 11/10/2015  . Chronic headaches   . Fatigue 11/10/2015  . Fibromyalgia 11/10/2015  . Myalgia 11/10/2015  . Ovarian cyst   . Smoker 11/10/2015    Past Surgical History:  Procedure Laterality Date  . APPENDECTOMY    . OVARIAN CYST SURGERY    . TUBAL LIGATION      There were no vitals filed for this visit.      Subjective Assessment - 03/19/16 1607    Subjective Pt stated she is feeling good today.  Reports she is a little sore following 17,000 steps walk yesterday.  Reports compliance with HEP.     Pertinent History unremarkable    Patient Stated Goals no leg symptoms, sleeping all night.    Currently in Pain? No/denies                         [facility] Adult PT Treatment/Exercise - 03/19/16 0001      Lumbar Exercises: Stretches   Passive Hamstring Stretch Limitations Gastroc stretch 1x 30" instructed to assist with soreness per request for soreness.   Quad Stretch 1 rep;30 seconds   Quad Stretch Limitations instructed to assist with soreness per request for soreness     Lumbar Exercises: Standing   Heel  Raises 15 reps   Heel Raises Limitations toe raises with minimal HHA   Functional Squats 10 reps   Forward Lunge 10 reps   Forward Lunge Limitations BLE on 6in step   Other Standing Lumbar Exercises SLS Rt 50", Lt 57"    Other Standing Lumbar Exercises tandem stance on airex     Lumbar Exercises: Supine   Ab Set 10 reps     Lumbar Exercises: Sidelying   Clam 15 reps   Clam Limitations RTB   Hip Abduction 15 reps     Lumbar Exercises: Prone   Straight Leg Raise 15 reps   Other Prone Lumbar Exercises child's pose 3x 30" each direction     Lumbar Exercises: Quadruped   [place]/Old Horse 5 reps   Madcat/Old Horse Limitations cueing for form/technqiue 10" holds                  PT Short Term Goals - 03/16/16 1628      PT SHORT TERM GOAL #1   Title Pt to state that he radicular sx are only going down to mid thigh level to demonstrate decreased nerve irritation    Time 2   Period Weeks   Status New     PT SHORT  TERM GOAL #2   Title Pt to verbalize the importance of proper body mechanics and posture has in back care    Time 2   Period Weeks   Status New     PT SHORT TERM GOAL #3   Title Pt back pain to decrease to no greater than a 5/10 to allow pt to sleep with waking a maximum of one time a night.    Time 2   Period Weeks   Status New     PT SHORT TERM GOAL #4   Title Pt ROM of back and hip to improve to allow pt to don shoes and sock with ease.    Time 2   Period Weeks   Status New           PT Long Term Goals - 03/16/16 1631      PT LONG TERM GOAL #1   Title Pt to verbalize that she is no longer having radicular sx   Time 4   Period Weeks   Status New     PT LONG TERM GOAL #2   Title Pt trunk ROM to improve to where pt is able to bend to pick items off the floor without difficulty    Time 4   Period Weeks   Status New     PT LONG TERM GOAL #3   Title Pt back pain to be no greater than a 2/10 to allow pt to sleep without waking up at night.     Time 4   Period Weeks   Status New     PT LONG TERM GOAL #4   Title Pt to have weaned away from her back brace to stop weakening of abdominal and lumbar mm.    Time 4   Period Weeks   Status New               Plan - 03/19/16 1623    Clinical Impression Statement Pt progressing well with reports of complaince with HEP and reports weaning out of brace for one hour with work today.  No reports of pain.  Continued session focus on core and proximal strengthening and progressed to standing stability strengthening.  Pt able to demonstrate appropraite form and technique with new exercises.  No reports of pain through session.  Pt reports she feels she has improved 85% perceived functional abilities with no pain and function.  Pt stated she was sore quad and gastroc following walk yesterday, instructed stretches to assist with soreness/pain and given progressed HEP for standing stabilty therex.  Able to verbalize understanding and demonstrate correct form with additional HEP exercises.     Rehab Potential Good   PT Frequency 3x / week   PT Duration 4 weeks   PT Treatment/Interventions Therapeutic activities;Therapeutic exercise;Patient/family education;Manual techniques;Ultrasound;Moist Heat   PT Next Visit Plan Next session add prone UE/LE and vector stance.  Progress to standing stabilization, then work towards work simulation.  See how pt is doing as far a weaning off her lumbar support.    PT Home Exercise Plan single knee to chest, isometric gluts and abdominal, bent knee raise and bridging; 02/23 Gastroc/quad stretches for soreness, squats and SLS for stabilty.      Patient will benefit from skilled therapeutic intervention in order to improve the following deficits and impairments:  Decreased activity tolerance, Decreased strength, Pain, Decreased range of motion  Visit Diagnosis: Radiculopathy, lumbar region  Muscle weakness (generalized)     Problem List Patient  Active  Problem List   Diagnosis Date Noted  . Fatigue 11/10/2015  . Myalgia 11/10/2015  . Fibromyalgia 11/10/2015  . Cervical lymphadenitis 11/10/2015  . Smoker 11/10/2015  . Chronic fatigue 11/10/2015  . Menorrhagia with irregular cycle 10/17/2014  . Depression 08/27/2014   [name], [place]; [employer]  [name] 03/19/2016, 5:00 PM  [facility] [address] [place], [name], [address] Phone: [phone]   Fax:  [phone]  Name: Lisa Fields MRN: [id number] Date of Birth: 10-06-1980

## 2016-03-19 NOTE — Patient Instructions (Signed)
Calf Stretch    Place hands on wall at shoulder height. Keeping back leg straight, bend front leg, feet pointing forward, heels flat on floor. Lean forward slightly until stretch is felt in calf of back leg. Hold stretch 30 seconds, breathing slowly in and out. Repeat stretch with other leg back. Do 3 sessions per day. Variation: Use chair or table for support.  Copyright  VHI. All rights reserved.    Quadricep Stretch    Holding support, grasp right ankle. Gently pull foot toward buttocks until a stretch is felt on the upper front of leg. Hold 30 seconds. Repeat 3 times. Do 2 sessions per day.  http://gt2.exer.us/278   Copyright  VHI. All rights reserved.   FUNCTIONAL MOBILITY: Squat    Stance: shoulder-width on floor. Bend hips and knees. Keep back straight. Do not allow knees to bend past toes. Squeeze glutes and quads to stand. 10 reps per set, 2 sets per day, 4 days per week  Copyright  VHI. All rights reserved.   Single Leg Balance: Eyes Open    Stand on right leg with eyes open. Hold 60 seconds. 3 reps 2 times per day.  http://ggbe.exer.us/5   Copyright  VHI. All rights reserved.

## 2016-03-23 ENCOUNTER — Telehealth (HOSPITAL_COMMUNITY): Payer: Self-pay | Admitting: Family Medicine

## 2016-03-23 ENCOUNTER — Ambulatory Visit (HOSPITAL_COMMUNITY): Payer: 59

## 2016-03-23 NOTE — Telephone Encounter (Signed)
03/23/16  Pt cx - she said that her daughter had the flu and she would be at the next appt.

## 2016-03-24 ENCOUNTER — Ambulatory Visit (HOSPITAL_COMMUNITY): Payer: 59 | Admitting: Physical Therapy

## 2016-03-24 DIAGNOSIS — M5416 Radiculopathy, lumbar region: Secondary | ICD-10-CM | POA: Diagnosis not present

## 2016-03-24 DIAGNOSIS — M6281 Muscle weakness (generalized): Secondary | ICD-10-CM

## 2016-03-24 DIAGNOSIS — S76301A Unspecified injury of muscle, fascia and tendon of the posterior muscle group at thigh level, right thigh, initial encounter: Secondary | ICD-10-CM | POA: Diagnosis not present

## 2016-03-24 NOTE — Therapy (Signed)
[place], [name], [address] Phone: [phone]   Fax:  [phone]  Physical Therapy Treatment  Patient Details  Name: Lisa Fields MRN: [id number] Date of Birth: 08/11/1980 Referring Provider: Phylliss Bob  Encounter Date: 03/24/2016      PT End of Session - 03/24/16 1740    Visit Number 4   Number of Visits 12   Date for PT Re-Evaluation 04/15/16   Authorization Type work comp    Authorization - Visit Number 4   Authorization - Number of Visits 12   PT Start Time 1518   PT Stop Time 1558   PT Time Calculation (min) 40 min   Activity Tolerance Patient tolerated treatment well   Behavior During Therapy [facility] for tasks assessed/performed      Past Medical History:  Diagnosis Date  . Bronchitis   . Cervical lymphadenitis 11/10/2015  . Chronic fatigue 11/10/2015  . Chronic headaches   . Fatigue 11/10/2015  . Fibromyalgia 11/10/2015  . Myalgia 11/10/2015  . Ovarian cyst   . Smoker 11/10/2015    Past Surgical History:  Procedure Laterality Date  . APPENDECTOMY    . OVARIAN CYST SURGERY    . TUBAL LIGATION      There were no vitals filed for this visit.      Subjective Assessment - 03/24/16 1518    Subjective Patient arrives today stating she is feeling good, she felt good but a little sore after last session. She is out of the brace completely as of today but she only worked half a day today. Nothing major going on otherwise.    Pertinent History unremarkable    Patient Stated Goals no leg symptoms, sleeping all night.    Currently in Pain? Yes   Pain Score 2    Pain Location Back   Pain Orientation Lower   Pain Descriptors / Indicators Aching   Pain Type Chronic pain   Pain Radiating Towards none    Pain Onset 1 to 4 weeks ago   Pain Frequency Constant   Aggravating Factors  not sure, sometimes activity in general    Pain Relieving Factors ice    Effect of Pain on Daily Activities  just annoying, has not returned to gym yet                          [facility] Adult PT Treatment/Exercise - 03/24/16 0001      Lumbar Exercises: Standing   Heel Raises 15 reps   Heel Raises Limitations toe and heel   on incline    Functional Squats 10 reps   Functional Squats Limitations poor form despite cues    Forward Lunge 10 reps   Forward Lunge Limitations BLE on 6in step     Lumbar Exercises: Seated   Sit to Stand 10 reps   Sit to Stand Limitations no HHA, cueing for eccentric control     Lumbar Exercises: Supine   Ab Set 20 reps   AB Set Limitations 3 second holds    Clam 15 reps   Clam Limitations RTB    Bridge 15 reps   Bridge Limitations RTB      Lumbar Exercises: Sidelying   Clam 15 reps   Clam Limitations GTB   Hip Abduction 15 reps   Hip Abduction Weights (lbs) 2   Hip Abduction Limitations 7 reps with 2# L LE, 8 repss with 1# L LE  Lumbar Exercises: Prone   Straight Leg Raise --   Straight Leg Raises Limitations --   Other Prone Lumbar Exercises child's pose 2x60" each direction, 3 ways      Lumbar Exercises: Quadruped   Madcat/Old Horse 5 reps   Madcat/Old Horse Limitations cues for form                 PT Education - 03/24/16 1740    Education provided No          PT Short Term Goals - 03/16/16 1628      PT SHORT TERM GOAL #1   Title Pt to state that he radicular sx are only going down to mid thigh level to demonstrate decreased nerve irritation    Time 2   Period Weeks   Status New     PT SHORT TERM GOAL #2   Title Pt to verbalize the importance of proper body mechanics and posture has in back care    Time 2   Period Weeks   Status New     PT SHORT TERM GOAL #3   Title Pt back pain to decrease to no greater than a 5/10 to allow pt to sleep with waking a maximum of one time a night.    Time 2   Period Weeks   Status New     PT SHORT TERM GOAL #4   Title Pt ROM of back and hip to improve to allow pt to  don shoes and sock with ease.    Time 2   Period Weeks   Status New           PT Long Term Goals - 03/16/16 1631      PT LONG TERM GOAL #1   Title Pt to verbalize that she is no longer having radicular sx   Time 4   Period Weeks   Status New     PT LONG TERM GOAL #2   Title Pt trunk ROM to improve to where pt is able to bend to pick items off the floor without difficulty    Time 4   Period Weeks   Status New     PT LONG TERM GOAL #3   Title Pt back pain to be no greater than a 2/10 to allow pt to sleep without waking up at night.    Time 4   Period Weeks   Status New     PT LONG TERM GOAL #4   Title Pt to have weaned away from her back brace to stop weakening of abdominal and lumbar mm.    Time 4   Period Weeks   Status New               Plan - 03/24/16 1741    Clinical Impression Statement Continued with proximal strengthening as well as standing stabilization exercises as indicated in PT POC; patient appears to be doing quite well at this time, and exercises/difficulty was progressed as appropriate. Trialed sidelying hip ABD with weight however had to adjust load on L LE, this flared pain in back but able to reduce this with repeated childs pose stretches. Otherwise continued with previously performed exercises.    Rehab Potential Good   PT Frequency 3x / week   PT Duration 4 weeks   PT Treatment/Interventions Therapeutic activities;Therapeutic exercise;Patient/family education;Manual techniques;Ultrasound;Moist Heat   PT Next Visit Plan Next session add prone UE/LE and vector stance.  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  See how pt is doing as far a weaning off her lumbar support.    PT Home Exercise Plan single knee to chest, isometric gluts and abdominal, bent knee raise and bridging; 02/23 Gastroc/quad stretches for soreness, squats and SLS for stabilty.   Consulted and Agree with Plan of Care Patient      Patient will  benefit from skilled therapeutic intervention in order to improve the following deficits and impairments:  Decreased activity tolerance, Decreased strength, Pain, Decreased range of motion  Visit Diagnosis: Radiculopathy, lumbar region  Muscle weakness (generalized)     Problem List Patient Active Problem List   Diagnosis Date Noted  . Fatigue 11/10/2015  . Myalgia 11/10/2015  . Fibromyalgia 11/10/2015  . Cervical lymphadenitis 11/10/2015  . Smoker 11/10/2015  . Chronic fatigue 11/10/2015  . Menorrhagia with irregular cycle 10/17/2014  . Depression 08/27/2014    Deniece Ree PT, DPT [phone]  [facility] [address] [place], [name], [address] Phone: [phone]   Fax:  [phone]  Name: Lisa Fields MRN: [id number] Date of Birth: 1980-08-15

## 2016-03-26 ENCOUNTER — Ambulatory Visit (HOSPITAL_COMMUNITY): Payer: 59 | Attending: Family Medicine

## 2016-03-26 ENCOUNTER — Telehealth (HOSPITAL_COMMUNITY): Payer: Self-pay

## 2016-03-26 DIAGNOSIS — M6281 Muscle weakness (generalized): Secondary | ICD-10-CM | POA: Insufficient documentation

## 2016-03-26 DIAGNOSIS — M5416 Radiculopathy, lumbar region: Secondary | ICD-10-CM | POA: Insufficient documentation

## 2016-03-26 NOTE — Telephone Encounter (Signed)
No show, called and left message pertaining to missed apt today.  Included next apt date and time with contact info given.  Explained 2 no show policy in message as well.    [address], [place]; [facility] [phone]

## 2016-03-30 ENCOUNTER — Telehealth (HOSPITAL_COMMUNITY): Payer: Self-pay

## 2016-03-30 ENCOUNTER — Ambulatory Visit (HOSPITAL_COMMUNITY): Payer: 59

## 2016-03-30 NOTE — Telephone Encounter (Signed)
Her daughter in on suide watch at the hospital and she will be with her.

## 2016-04-01 ENCOUNTER — Ambulatory Visit (HOSPITAL_COMMUNITY): Payer: 59

## 2016-04-01 DIAGNOSIS — M6281 Muscle weakness (generalized): Secondary | ICD-10-CM | POA: Diagnosis not present

## 2016-04-01 DIAGNOSIS — M5416 Radiculopathy, lumbar region: Secondary | ICD-10-CM

## 2016-04-01 NOTE — Therapy (Signed)
[name], [name], [address] Phone: [phone]   Fax:  [phone]  Physical Therapy Treatment  Patient Details  Name: Lisa Fields MRN: [id number] Date of Birth: 11/16/1980 Referring Provider: Phylliss Bob  Encounter Date: 04/01/2016      PT End of Session - 04/01/16 0925    Visit Number 5   Number of Visits 12   Date for PT Re-Evaluation 04/15/16   Authorization Type work comp    Authorization - Visit Number 5   Authorization - Number of Visits 12   PT Start Time [phone]   PT Stop Time 0946   PT Time Calculation (min) 30 min   Activity Tolerance Patient tolerated treatment well;No increased pain   Behavior During Therapy WFL for tasks assessed/performed      Past Medical History:  Diagnosis Date  . Bronchitis   . Cervical lymphadenitis 11/10/2015  . Chronic fatigue 11/10/2015  . Chronic headaches   . Fatigue 11/10/2015  . Fibromyalgia 11/10/2015  . Myalgia 11/10/2015  . Ovarian cyst   . Smoker 11/10/2015    Past Surgical History:  Procedure Laterality Date  . APPENDECTOMY    . OVARIAN CYST SURGERY    . TUBAL LIGATION      There were no vitals filed for this visit.      Subjective Assessment - 04/01/16 0917    Subjective Pt 15 min late for apt today.  Pt stated her lower back is feeling good.  Does complain of some neck pain feels related to stress and recent hospitalization of her daughter.     Pertinent History unremarkable    Patient Stated Goals no leg symptoms, sleeping all night.    Currently in Pain? No/denies   Pain Location --  Stress in neck muscles                         [facility] Adult PT Treatment/Exercise - 04/01/16 0001      Lumbar Exercises: Standing   Functional Squats 15 reps   Functional Squats Limitations moderate cueing for form   Forward Lunge 15 reps   Forward Lunge Limitations BLE on 6in step with UE flexion   Other Standing Lumbar  Exercises tandem stance 3x30" on foam   Other Standing Lumbar Exercises vector stance 3x 5" BLE with intermittent HHA     Lumbar Exercises: Sidelying   Hip Abduction 15 reps     Lumbar Exercises: Prone   Opposite Arm/Leg Raise Right arm/Left leg;Left arm/Right leg;5 reps;5 seconds   Other Prone Lumbar Exercises child's pose 1x60" each direction, 3 ways                   PT Short Term Goals - 03/16/16 1628      PT SHORT TERM GOAL #1   Title Pt to state that he radicular sx are only going down to mid thigh level to demonstrate decreased nerve irritation    Time 2   Period Weeks   Status New     PT SHORT TERM GOAL #2   Title Pt to verbalize the importance of proper body mechanics and posture has in back care    Time 2   Period Weeks   Status New     PT SHORT TERM GOAL #3   Title Pt back pain to decrease to no greater than a 5/10 to allow pt to sleep with waking a maximum of one  time a night.    Time 2   Period Weeks   Status New     PT SHORT TERM GOAL #4   Title Pt ROM of back and hip to improve to allow pt to don shoes and sock with ease.    Time 2   Period Weeks   Status New           PT Long Term Goals - 03/16/16 1631      PT LONG TERM GOAL #1   Title Pt to verbalize that she is no longer having radicular sx   Time 4   Period Weeks   Status New     PT LONG TERM GOAL #2   Title Pt trunk ROM to improve to where pt is able to bend to pick items off the floor without difficulty    Time 4   Period Weeks   Status New     PT LONG TERM GOAL #3   Title Pt back pain to be no greater than a 2/10 to allow pt to sleep without waking up at night.    Time 4   Period Weeks   Status New     PT LONG TERM GOAL #4   Title Pt to have weaned away from her back brace to stop weakening of abdominal and lumbar mm.    Time 4   Period Weeks   Status New               Plan - 04/01/16 4431    Clinical Impression Statement Pt arrived late for session due to  family issues with reports of neck pain feels due to stress.  Pt reports back has been pain free since last session and no longer wearing lumbar support.  This session progressed core stability with quadruped opp UE/LE, tandem stance and less HHA wiht functional strengthening activities for core activation.  Pt able to complete all exercises with moderate cueing for form and stability.  No reports of pain through session.     Rehab Potential Good   PT Frequency 3x / week   PT Duration 4 weeks   PT Treatment/Interventions Therapeutic activities;Therapeutic exercise;Patient/family education;Manual techniques;Ultrasound;Moist Heat   PT Next Visit Plan Progress to standing stabilization, then work towards work simulation.  See how pt is doing as far a weaning off her lumbar support.    PT Home Exercise Plan single knee to chest, isometric gluts and abdominal, bent knee raise and bridging; 02/23 Gastroc/quad stretches for soreness, squats and SLS for stabilty; quadruped UE/LE      Patient will benefit from skilled therapeutic intervention in order to improve the following deficits and impairments:  Decreased activity tolerance, Decreased strength, Pain, Decreased range of motion  Visit Diagnosis: Radiculopathy, lumbar region  Muscle weakness (generalized)     Problem List Patient Active Problem List   Diagnosis Date Noted  . Fatigue 11/10/2015  . Myalgia 11/10/2015  . Fibromyalgia 11/10/2015  . Cervical lymphadenitis 11/10/2015  . Smoker 11/10/2015  . Chronic fatigue 11/10/2015  . Menorrhagia with irregular cycle 10/17/2014  . Depression 08/27/2014   [name], [name]; [name]  [name] 04/01/2016, 10:00 AM  [name], [name], [address] Phone: [phone]   Fax:  [phone]  Name: Lisa Fields MRN: [id number] Date of Birth: 11-14-1980

## 2016-04-01 NOTE — Patient Instructions (Signed)
Bracing With Arm / Leg Raise (Quadruped)    On hands and knees find neutral spine. Tighten pelvic floor and abdominals and hold. Alternating, lift arm to shoulder level and opposite leg to hip level. Repeat 10 times and hold for 5".  Copyright  VHI. All rights reserved.

## 2016-04-02 ENCOUNTER — Ambulatory Visit (HOSPITAL_COMMUNITY): Payer: 59

## 2016-04-02 DIAGNOSIS — M6281 Muscle weakness (generalized): Secondary | ICD-10-CM | POA: Diagnosis not present

## 2016-04-02 DIAGNOSIS — M5416 Radiculopathy, lumbar region: Secondary | ICD-10-CM

## 2016-04-02 NOTE — Therapy (Signed)
[place], [name], [address] Phone: [phone]   Fax:  [phone]  Physical Therapy Treatment  Patient Details  Name: TURQUOISE ESCH MRN: [id number] Date of Birth: 1980/07/14 Referring Provider: Phylliss Bob  Encounter Date: 04/02/2016      PT End of Session - 04/02/16 1529    Visit Number 6   Number of Visits 12   Date for PT Re-Evaluation 04/15/16   Authorization Type work comp    Authorization - Visit Number 6   Authorization - Number of Visits 12   PT Start Time 1525   PT Stop Time 1557   PT Time Calculation (min) 32 min   Activity Tolerance Patient tolerated treatment well;No increased pain   Behavior During Therapy WFL for tasks assessed/performed      Past Medical History:  Diagnosis Date  . Bronchitis   . Cervical lymphadenitis 11/10/2015  . Chronic fatigue 11/10/2015  . Chronic headaches   . Fatigue 11/10/2015  . Fibromyalgia 11/10/2015  . Myalgia 11/10/2015  . Ovarian cyst   . Smoker 11/10/2015    Past Surgical History:  Procedure Laterality Date  . APPENDECTOMY    . OVARIAN CYST SURGERY    . TUBAL LIGATION      There were no vitals filed for this visit.      Subjective Assessment - 04/02/16 1523    Subjective Pt late for apt today.  Pt stated her back is feeling good today, does c/o neck pain feels related to stress with work and her daughter   Pertinent History unremarkable    Patient Stated Goals no leg symptoms, sleeping all night.    Currently in Pain? No/denies                         [facility] Adult PT Treatment/Exercise - 04/02/16 0001      Lumbar Exercises: Standing   Heel Raises 15 reps   Heel Raises Limitations toe and heel    Functional Squats 10 reps   Functional Squats Limitations moderate cueing for form   Lifting From floor;10 reps   Lifting Limitations 2 sets initially wiht toe incline for proper weight bearing   Forward Lunge 15 reps    Forward Lunge Limitations BLE on 6in step with UE flexion   Scapular Retraction 10 reps;Theraband   Theraband Level (Scapular Retraction) Level 2 (Red)   Row 10 reps;Theraband   Theraband Level (Row) Level 2 (Red)   Shoulder Extension 10 reps;Theraband   Theraband Level (Shoulder Extension) Level 2 (Red)   Other Standing Lumbar Exercises vector stance 3x 10" BLE with intermittent HHA     Lumbar Exercises: Seated   LAQ on Ball Limitations Cervical and scapular retraction     Lumbar Exercises: Quadruped   Opposite Arm/Leg Raise Right arm/Left leg;Left arm/Right leg;10 reps;5 seconds   Opposite Arm/Leg Raise Limitations core activation prior lift                  PT Short Term Goals - 03/16/16 1628      PT SHORT TERM GOAL #1   Title Pt to state that he radicular sx are only going down to mid thigh level to demonstrate decreased nerve irritation    Time 2   Period Weeks   Status New     PT SHORT TERM GOAL #2   Title Pt to verbalize the importance of proper body mechanics and posture has in  back care    Time 2   Period Weeks   Status New     PT SHORT TERM GOAL #3   Title Pt back pain to decrease to no greater than a 5/10 to allow pt to sleep with waking a maximum of one time a night.    Time 2   Period Weeks   Status New     PT SHORT TERM GOAL #4   Title Pt ROM of back and hip to improve to allow pt to don shoes and sock with ease.    Time 2   Period Weeks   Status New           PT Long Term Goals - 03/16/16 1631      PT LONG TERM GOAL #1   Title Pt to verbalize that she is no longer having radicular sx   Time 4   Period Weeks   Status New     PT LONG TERM GOAL #2   Title Pt trunk ROM to improve to where pt is able to bend to pick items off the floor without difficulty    Time 4   Period Weeks   Status New     PT LONG TERM GOAL #3   Title Pt back pain to be no greater than a 2/10 to allow pt to sleep without waking up at night.    Time 4    Period Weeks   Status New     PT LONG TERM GOAL #4   Title Pt to have weaned away from her back brace to stop weakening of abdominal and lumbar mm.    Time 4   Period Weeks   Status New               Plan - 04/02/16 1557    Clinical Impression Statement Session focus on education of importance of proper body mechanics with work based tasks.  Pt continues to required cueing for proper weight bearing with lifting.  Reviewed goals with pt., reoprts compliance with HEP, pain free lumbar region and no longer wearing lumbar support.  Reviewed importance of proper posture for neck and back pain, verbalized understanding.  Pt to be reassess next session.     Rehab Potential Good   PT Frequency 3x / week   PT Duration 4 weeks   PT Treatment/Interventions Therapeutic activities;Therapeutic exercise;Patient/family education;Manual techniques;Ultrasound;Moist Heat   PT Next Visit Plan Reassess next session      Patient will benefit from skilled therapeutic intervention in order to improve the following deficits and impairments:  Decreased activity tolerance, Decreased strength, Pain, Decreased range of motion  Visit Diagnosis: Radiculopathy, lumbar region  Muscle weakness (generalized)     Problem List Patient Active Problem List   Diagnosis Date Noted  . Fatigue 11/10/2015  . Myalgia 11/10/2015  . Fibromyalgia 11/10/2015  . Cervical lymphadenitis 11/10/2015  . Smoker 11/10/2015  . Chronic fatigue 11/10/2015  . Menorrhagia with irregular cycle 10/17/2014  . Depression 08/27/2014   [name], [place]; [place]  [name] 04/02/2016, 4:10 PM  [facility] [address] [place], [name], [address] Phone: [phone]   Fax:  [phone]  Name: ZAVIA PULLEN MRN: [id number] Date of Birth: [birth year]

## 2016-04-06 ENCOUNTER — Telehealth (HOSPITAL_COMMUNITY): Payer: Self-pay | Admitting: Family Medicine

## 2016-04-06 ENCOUNTER — Ambulatory Visit (HOSPITAL_COMMUNITY): Payer: 59 | Admitting: Physical Therapy

## 2016-04-06 DIAGNOSIS — M5416 Radiculopathy, lumbar region: Secondary | ICD-10-CM

## 2016-04-06 DIAGNOSIS — M6281 Muscle weakness (generalized): Secondary | ICD-10-CM | POA: Diagnosis not present

## 2016-04-06 NOTE — Therapy (Signed)
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 Bronchitis   . Cervical lymphadenitis 11/10/2015  . Chronic fatigue 11/10/2015  . Chronic headaches   . Fatigue 11/10/2015  . Fibromyalgia 11/10/2015  . Myalgia 11/10/2015  . Ovarian cyst   . Smoker 11/10/2015    Past Surgical History:  Procedure Laterality Date  . APPENDECTOMY    . OVARIAN CYST SURGERY    . TUBAL LIGATION      There were no vitals filed for this visit.      Subjective Assessment - 04/06/16 1635    Subjective Patient arrives stating that things are going well, she is not using her back brace anymore and the MD has released her. She has no problems at work. Everything is going good, nothing is hard for her to do. She rates herself as being at baseline in general but does want to work on stopping smoking/getting more in shape, etc.    Pertinent History unremarkable    How long can you sit comfortably? unlimited    How long can you stand comfortably? unlimited    How long can you walk comfortably? unlimited   Patient Stated Goals no leg symptoms, sleeping all night.    Currently in Pain? No/denies            [facility] PT Assessment - 04/06/16 0001      AROM    Lumbar Flexion WNL    Lumbar Extension WNL    Lumbar - Right Side Bend WNL    Lumbar - Left Side Bend WNL      Strength   Right Hip Flexion 5/5   Right Hip Extension 5/5   Right Hip ABduction 5/5   Left Hip Flexion 5/5   Left Hip Extension 5/5   Left Hip ABduction 5/5   Right Knee Flexion 4+/5   Right Knee Extension 5/5   Left Knee Flexion 4+/5   Left Knee Extension 5/5     6 minute walk test results    Aerobic Endurance Distance Walked 934   Endurance additional comments [id number]                             PT Education - 04/06/16 1659    Education provided Yes   Education Details progress with skilled PT services, DC today; education regarding [employer] and smoking cessation programs    Person(s) Educated Patient   Methods Explanation;Handout   Comprehension Verbalized understanding          PT Short Term Goals - 04/06/16 1649      PT SHORT TERM GOAL #1  Title Pt to state that he radicular sx are only going down to mid thigh level to demonstrate decreased nerve irritation    Time 2   Period Weeks   Status Achieved     PT SHORT TERM GOAL #2   Title Pt to verbalize the importance of proper body mechanics and posture has in back care    Time 2   Period Weeks   Status Achieved     PT SHORT TERM GOAL #3   Title Pt back pain to decrease to no greater than a 5/10 to allow pt to sleep with waking a maximum of one time a night.    Time 2   Period Weeks   Status Achieved     PT SHORT TERM GOAL #4   Title Pt ROM of back and hip to improve to allow pt to don shoes and sock with ease.    Time 2   Period Weeks   Status Achieved           PT Long Term Goals - 04/06/16 1650      PT LONG TERM GOAL #1   Title Pt to verbalize that she is no longer having radicular sx   Time 4   Period Weeks   Status Achieved     PT LONG TERM GOAL #2   Title Pt trunk ROM to improve to where pt is able to bend to pick items off the floor without difficulty    Time  4   Period Weeks   Status Achieved     PT LONG TERM GOAL #3   Title Pt back pain to be no greater than a 2/10 to allow pt to sleep without waking up at night.    Time 4   Period Weeks   Status Achieved     PT LONG TERM GOAL #4   Title Pt to have weaned away from her back brace to stop weakening of abdominal and lumbar mm.    Time 4   Period Weeks   Status Achieved               Plan - 04/06/16 1700    Clinical Impression Statement Re-assessment performed today per patient request. Patient has made excellent progress with skilled PT services and has actually achieved all short and long term goals at this point; she reports that her remaining concern at this point is working on improving her personal physical fitness as well as smoking cessation, she is at baseline in terms of her back. Provided patient with information regarding [employer] waiver, [place] smoking cessation program this session. Recommend DC at this time, no further skilled PT services needed.    Rehab Potential Good   PT Next Visit Plan DC today       Patient will benefit from skilled therapeutic intervention in order to improve the following deficits and impairments:  Decreased activity tolerance, Decreased strength, Pain, Decreased range of motion  Visit Diagnosis: Radiculopathy, lumbar region  Muscle weakness (generalized)     Problem List Patient Active Problem List   Diagnosis Date Noted  . Fatigue 11/10/2015  . Myalgia 11/10/2015  . Fibromyalgia 11/10/2015  . Cervical lymphadenitis 11/10/2015  . Smoker 11/10/2015  . Chronic fatigue 11/10/2015  . Menorrhagia with irregular cycle 10/17/2014  . Depression 08/27/2014    PHYSICAL THERAPY DISCHARGE SUMMARY  Visits from Start of Care: 7  Current functional level related to goals / functional outcomes: All symptoms have resolved and patient  reports she is back to her baseline. No further skilled PT services necessary at this time, DC today.     Remaining deficits: Postural deficits    Education / Equipment: progress with skilled PT services, DC today; education regarding [employer] and smoking cessation programs  Plan: Patient agrees to discharge.  Patient goals were met. Patient is being discharged due to meeting the stated rehab goals.  ?????       Deniece Ree PT, DPT [place] [address] [place], [name], [address] Phone: [phone]   Fax:  [phone]  Name: Lisa Fields MRN: [id number] Date of Birth: 02/16/1980

## 2016-04-06 NOTE — Telephone Encounter (Signed)
04/06/16 I spoke to patient and she thought her appt was at 4 today.  I offered a 4:45 but she said she had to call her husband, he had the car and she would call back.

## 2016-04-07 ENCOUNTER — Ambulatory Visit (HOSPITAL_COMMUNITY): Payer: 59 | Admitting: Physical Therapy

## 2016-04-08 ENCOUNTER — Ambulatory Visit (HOSPITAL_COMMUNITY): Payer: 59 | Admitting: Physical Therapy

## 2016-05-03 ENCOUNTER — Ambulatory Visit (INDEPENDENT_AMBULATORY_CARE_PROVIDER_SITE_OTHER): Payer: 59 | Admitting: Family Medicine

## 2016-05-03 ENCOUNTER — Encounter: Payer: Self-pay | Admitting: Family Medicine

## 2016-05-03 VITALS — BP 122/80 | Temp 98.9°F | Ht 69.0 in | Wt 170.0 lb

## 2016-05-03 DIAGNOSIS — M272 Inflammatory conditions of jaws: Secondary | ICD-10-CM | POA: Diagnosis not present

## 2016-05-03 MED ORDER — OXYCODONE-ACETAMINOPHEN 5-325 MG PO TABS: ORAL_TABLET | ORAL | 0 refills | Status: DC

## 2016-05-03 MED ORDER — PENICILLIN V POTASSIUM 500 MG PO TABS: 500.0000 mg | ORAL_TABLET | Freq: Four times a day (QID) | ORAL | 0 refills | Status: DC

## 2016-05-03 NOTE — Progress Notes (Signed)
   Subjective:    Patient ID: Lisa Fields, female    DOB: 1980/11/17, 36 y.o.   MRN: [id number]  HPIAbscess tooth. Starting having jaw pain,fairly severe pain  contd to wok thru the wekend  Head throbbing  Sig nausea  Woke up last night sith sevete pain and nause and feeling reallly bad  Called dentist but no appt til Friday          Migraine headache and vomiting  3 days ago.   Also got a spider bite on abdomen 3 days ago.     Review of Systems No headache, no major weight loss or weight gain, no chest pain no back pain abdominal pain no change in bowel habits complete ROS otherwise negative And your kidney    Objective:   Physical Exam Alert vitals stable, NAD. Blood pressure good on repeat. HEENT left upper jaw swollen tender to palpation with tender nodes under her left mandible otherwisenormal. Lungs clear. Heart regular rate and rhythm.        Assessment & Plan:  Impression odontogenic infection with secondary lymphadenitis plan Pen-Vee K 500 4 times a day 10 days. Oxycodone when necessary for pain. Local meas discussed

## 2016-05-28 ENCOUNTER — Encounter: Payer: Self-pay | Admitting: Family Medicine

## 2016-05-28 ENCOUNTER — Ambulatory Visit (INDEPENDENT_AMBULATORY_CARE_PROVIDER_SITE_OTHER): Payer: 59 | Admitting: Family Medicine

## 2016-05-28 VITALS — BP 110/74 | Temp 98.6°F | Ht 69.0 in | Wt 167.5 lb

## 2016-05-28 DIAGNOSIS — J209 Acute bronchitis, unspecified: Secondary | ICD-10-CM

## 2016-05-28 DIAGNOSIS — R6889 Other general symptoms and signs: Secondary | ICD-10-CM | POA: Diagnosis not present

## 2016-05-28 DIAGNOSIS — J019 Acute sinusitis, unspecified: Secondary | ICD-10-CM | POA: Diagnosis not present

## 2016-05-28 MED ORDER — AMOXICILLIN-POT CLAVULANATE 875-125 MG PO TABS: 1.0000 | ORAL_TABLET | Freq: Two times a day (BID) | ORAL | 0 refills | Status: DC

## 2016-05-28 MED ORDER — HYDROCODONE-HOMATROPINE 5-1.5 MG/5ML PO SYRP: ORAL_SOLUTION | ORAL | 0 refills | Status: DC

## 2016-05-28 NOTE — Progress Notes (Deleted)
i

## 2016-05-28 NOTE — Progress Notes (Signed)
   Subjective:    Patient ID: Lisa Fields, female    DOB: 12/27/1980, 36 y.o.   MRN: [id number]  Influenza  This is a new problem. The current episode started 1 to 4 weeks ago. Associated symptoms include chest pain, congestion, coughing, a fever, headaches and myalgias. She has tried acetaminophen and NSAIDs for the symptoms.   Patient states no other concerns this visit.  Patient with body aches head congestion drainage coughing this been gone on for a couple weeks started off with flulike illness with them progressive sinus and chest congestion  Review of Systems  Constitutional: Positive for fever.  HENT: Positive for congestion.   Respiratory: Positive for cough.   Cardiovascular: Positive for chest pain.  Musculoskeletal: Positive for myalgias.  Neurological: Positive for headaches.       Objective:   Physical Exam Lungs clear hearts regular HEENT benign extremities no edema skin warm dry Work excuse given for a few days      Assessment & Plan:  Post influenza Secondary sinusitis Secondary bronchitis Antibiotics prescribed warning signs to Patient requests cough medicine for nighttime use for the next week if necessary cautioned drowsiness follow-up if progressive troubles or worse

## 2016-06-28 ENCOUNTER — Other Ambulatory Visit: Payer: Self-pay | Admitting: Family Medicine

## 2016-06-28 MED FILL — CITALOPRAM HBR 40 MG TABLET: 40 | 90 days supply | Qty: 90 | Fill #0

## 2016-09-09 DIAGNOSIS — Z79899 Other long term (current) drug therapy: Secondary | ICD-10-CM | POA: Diagnosis not present

## 2016-09-09 DIAGNOSIS — M79672 Pain in left foot: Secondary | ICD-10-CM | POA: Diagnosis not present

## 2016-09-09 DIAGNOSIS — X501XXA Overexertion from prolonged static or awkward postures, initial encounter: Secondary | ICD-10-CM | POA: Diagnosis not present

## 2016-09-09 DIAGNOSIS — S96912A Strain of unspecified muscle and tendon at ankle and foot level, left foot, initial encounter: Secondary | ICD-10-CM | POA: Diagnosis not present

## 2016-09-09 DIAGNOSIS — F17200 Nicotine dependence, unspecified, uncomplicated: Secondary | ICD-10-CM | POA: Diagnosis not present

## 2016-10-14 ENCOUNTER — Telehealth: Payer: Self-pay | Admitting: Family Medicine

## 2016-10-14 MED ORDER — CITALOPRAM HYDROBROMIDE 40 MG PO TABS: 40.0000 mg | ORAL_TABLET | Freq: Every day | ORAL | 0 refills | Status: DC

## 2016-10-14 NOTE — Telephone Encounter (Signed)
Left message return call 10/14/16 (medication sent into pharmacy)

## 2016-10-14 NOTE — Telephone Encounter (Signed)
This +3 refills 

## 2016-10-14 NOTE — Telephone Encounter (Signed)
Patient needing refill on citalopram 40 mg called into [facility]

## 2016-11-03 ENCOUNTER — Telehealth: Payer: Self-pay | Admitting: Nurse Practitioner

## 2016-11-03 ENCOUNTER — Ambulatory Visit (INDEPENDENT_AMBULATORY_CARE_PROVIDER_SITE_OTHER): Payer: 59 | Admitting: Nurse Practitioner

## 2016-11-03 VITALS — BP 116/82 | Ht 69.0 in | Wt 169.6 lb

## 2016-11-03 DIAGNOSIS — F329 Major depressive disorder, single episode, unspecified: Secondary | ICD-10-CM | POA: Diagnosis not present

## 2016-11-03 DIAGNOSIS — R5383 Other fatigue: Secondary | ICD-10-CM | POA: Diagnosis not present

## 2016-11-03 DIAGNOSIS — Z13220 Encounter for screening for lipoid disorders: Secondary | ICD-10-CM | POA: Diagnosis not present

## 2016-11-03 DIAGNOSIS — K219 Gastro-esophageal reflux disease without esophagitis: Secondary | ICD-10-CM

## 2016-11-03 DIAGNOSIS — N920 Excessive and frequent menstruation with regular cycle: Secondary | ICD-10-CM

## 2016-11-03 DIAGNOSIS — F419 Anxiety disorder, unspecified: Secondary | ICD-10-CM

## 2016-11-03 DIAGNOSIS — Z23 Encounter for immunization: Secondary | ICD-10-CM | POA: Diagnosis not present

## 2016-11-03 DIAGNOSIS — Z01419 Encounter for gynecological examination (general) (routine) without abnormal findings: Secondary | ICD-10-CM | POA: Diagnosis not present

## 2016-11-03 DIAGNOSIS — K582 Mixed irritable bowel syndrome: Secondary | ICD-10-CM | POA: Diagnosis not present

## 2016-11-03 DIAGNOSIS — N946 Dysmenorrhea, unspecified: Secondary | ICD-10-CM | POA: Diagnosis not present

## 2016-11-03 MED ORDER — CLONAZEPAM 0.5 MG PO TABS: 0.5000 mg | ORAL_TABLET | Freq: Two times a day (BID) | ORAL | 0 refills | Status: DC | PRN

## 2016-11-03 MED ORDER — BUPROPION HCL ER (XL) 150 MG PO TB24: 150.0000 mg | ORAL_TABLET | Freq: Every day | ORAL | 2 refills | Status: DC

## 2016-11-03 MED ORDER — PANTOPRAZOLE SODIUM 40 MG PO TBEC: 40.0000 mg | DELAYED_RELEASE_TABLET | Freq: Every day | ORAL | 2 refills | Status: DC

## 2016-11-03 NOTE — Telephone Encounter (Signed)
Patient said that [facility] did not receive her Wellbutrin that was called in today by Hoyle Sauer.  Please advise.

## 2016-11-03 NOTE — Patient Instructions (Addendum)
Watch the two new moles on your chest for growth, color change, irregular borders. Call if you notice changes.   Continue counseling at [facility].  Start bupropion and return in about 1 month to check how you are feeling on this medication.   Food Choices for Gastroesophageal Reflux Disease, Adult When you have gastroesophageal reflux disease (GERD), the foods you eat and your eating habits are very important. Choosing the right foods can help ease your discomfort. What guidelines do I need to follow?  Choose fruits, vegetables, whole grains, and low-fat dairy products.  Choose low-fat meat, fish, and poultry.  Limit fats such as oils, salad dressings, butter, nuts, and avocado.  Keep a food diary. This helps you identify foods that cause symptoms.  Avoid foods that cause symptoms. These may be different for everyone.  Eat small meals often instead of 3 large meals a day.  Eat your meals slowly, in a place where you are relaxed.  Limit fried foods.  Cook foods using methods other than frying.  Avoid drinking alcohol.  Avoid drinking large amounts of liquids with your meals.  Avoid bending over or lying down until 2-3 hours after eating. What foods are not recommended? These are some foods and drinks that may make your symptoms worse: Vegetables Tomatoes. Tomato juice. Tomato and spaghetti sauce. Chili peppers. Onion and garlic. Horseradish. Fruits Oranges, grapefruit, and lemon (fruit and juice). Meats High-fat meats, fish, and poultry. This includes hot dogs, ribs, ham, sausage, salami, and bacon. Dairy Whole milk and chocolate milk. Sour cream. Cream. Butter. Ice cream. Cream cheese. Drinks Coffee and tea. Bubbly (carbonated) drinks or energy drinks. Condiments Hot sauce. Barbecue sauce. Sweets/Desserts Chocolate and cocoa. Donuts. Peppermint and spearmint. Fats and Oils High-fat foods. This includes [place] fries and potato chips. Other Vinegar. Strong spices.  This includes black pepper, white pepper, red pepper, cayenne, curry powder, cloves, ginger, and chili powder. The items listed above may not be a complete list of foods and drinks to avoid. Contact your dietitian for more information. This information is not intended to replace advice given to you by your health care provider. Make sure you discuss any questions you have with your health care provider. Document Released: 07/13/2011 Document Revised: 06/19/2015 Document Reviewed: 11/15/2012 Elsevier Interactive Patient Education  2017 [employer].  Gastroesophageal Reflux Disease, Adult Normally, food travels down the esophagus and stays in the stomach to be digested. However, when a person has gastroesophageal reflux disease (GERD), food and stomach acid move back up into the esophagus. When this happens, the esophagus becomes sore and inflamed. Over time, GERD can create small holes (ulcers) in the lining of the esophagus. What are the causes? This condition is caused by a problem with the muscle between the esophagus and the stomach (lower esophageal sphincter, or LES). Normally, the LES muscle closes after food passes through the esophagus to the stomach. When the LES is weakened or abnormal, it does not close properly, and that allows food and stomach acid to go back up into the esophagus. The LES can be weakened by certain dietary substances, medicines, and medical conditions, including:  Tobacco use.  Pregnancy.  Having a hiatal hernia.  Heavy alcohol use.  Certain foods and beverages, such as coffee, chocolate, onions, and peppermint.  What increases the risk? This condition is more likely to develop in:  People who have an increased body weight.  People who have connective tissue disorders.  People who use NSAID medicines.  What are the  signs or symptoms? Symptoms of this condition include:  Heartburn.  Difficult or painful swallowing.  The feeling of having a lump in  the throat.  Abitter taste in the mouth.  Bad breath.  Having a large amount of saliva.  Having an upset or bloated stomach.  Belching.  Chest pain.  Shortness of breath or wheezing.  Ongoing (chronic) cough or a night-time cough.  Wearing away of tooth enamel.  Weight loss.  Different conditions can cause chest pain. Make sure to see your health care provider if you experience chest pain. How is this diagnosed? Your health care provider will take a medical history and perform a physical exam. To determine if you have mild or severe GERD, your health care provider may also monitor how you respond to treatment. You may also have other tests, including:  An endoscopy toexamine your stomach and esophagus with a small camera.  A test thatmeasures the acidity level in your esophagus.  A test thatmeasures how much pressure is on your esophagus.  A barium swallow or modified barium swallow to show the shape, size, and functioning of your esophagus.  How is this treated? The goal of treatment is to help relieve your symptoms and to prevent complications. Treatment for this condition may vary depending on how severe your symptoms are. Your health care provider may recommend:  Changes to your diet.  Medicine.  Surgery.  Follow these instructions at home: Diet  Follow a diet as recommended by your health care provider. This may involve avoiding foods and drinks such as: ? Coffee and tea (with or without caffeine). ? Drinks that containalcohol. ? Energy drinks and sports drinks. ? Carbonated drinks or sodas. ? Chocolate and cocoa. ? Peppermint and mint flavorings. ? Garlic and onions. ? Horseradish. ? Spicy and acidic foods, including peppers, chili powder, curry powder, vinegar, hot sauces, and barbecue sauce. ? Citrus fruit juices and citrus fruits, such as oranges, lemons, and limes. ? Tomato-based foods, such as red sauce, chili, salsa, and pizza with red  sauce. ? Fried and fatty foods, such as donuts, french fries, potato chips, and high-fat dressings. ? High-fat meats, such as hot dogs and fatty cuts of red and white meats, such as rib eye steak, sausage, ham, and bacon. ? High-fat dairy items, such as whole milk, butter, and cream cheese.  Eat small, frequent meals instead of large meals.  Avoid drinking large amounts of liquid with your meals.  Avoid eating meals during the 2-3 hours before bedtime.  Avoid lying down right after you eat.  Do not exercise right after you eat. General instructions  Pay attention to any changes in your symptoms.  Take over-the-counter and prescription medicines only as told by your health care provider. Do not take aspirin, ibuprofen, or other NSAIDs unless your health care provider told you to do so.  Do not use any tobacco products, including cigarettes, chewing tobacco, and e-cigarettes. If you need help quitting, ask your health care provider.  Wear loose-fitting clothing. Do not wear anything tight around your waist that causes pressure on your abdomen.  Raise (elevate) the head of your bed 6 inches (15cm).  Try to reduce your stress, such as with yoga or meditation. If you need help reducing stress, ask your health care provider.  If you are overweight, reduce your weight to an amount that is healthy for you. Ask your health care provider for guidance about a safe weight loss goal.  Keep all follow-up visits as  told by your health care provider. This is important. Contact a health care provider if:  You have new symptoms.  You have unexplained weight loss.  You have difficulty swallowing, or it hurts to swallow.  You have wheezing or a persistent cough.  Your symptoms do not improve with treatment.  You have a hoarse voice. Get help right away if:  You have pain in your arms, neck, jaw, teeth, or back.  You feel sweaty, dizzy, or light-headed.  You have chest pain or shortness  of breath.  You vomit and your vomit looks like blood or coffee grounds.  You faint.  Your stool is bloody or black.  You cannot swallow, drink, or eat. This information is not intended to replace advice given to you by your health care provider. Make sure you discuss any questions you have with your health care provider. Document Released: 10/21/2004 Document Revised: 06/11/2015 Document Reviewed: 05/08/2014 Elsevier Interactive Patient Education  2017 [employer].

## 2016-11-03 NOTE — Progress Notes (Signed)
Subjective:    Patient ID: Lisa Fields, female    DOB: [birth year], 36 y.o.   MRN: [id number]  HPI: 36 y/o female presents today for annual wellness exam. Pt reports overall good health with healthy diet and walking on average 10 miles per day at work and regular yoga and exercise classes. Reports regular dental and vision exams. Pt reports increased depressive and anxiety symptoms since her 74 y/o daughter attempted suicide appx one month ago while living with pts grandmother. Reports sleeping only 4-5 hours at night, increase in cigarette smoking to 1 ppd, decreased appetite, alternating diarrhea and constipation, stomach pain and reflux, and palpitations. Pt reports she is seeking counseling with [facility] with her daughter. Reports she does not feel her citalopram is working well enough and states a friend told her bupropion could help with her depressive symptoms and to quit smoking. Pt reports menorrhagia and dysmenorrhea for appx the past two years. She reports going through 2 large packages of feminine pads in 5 days while on her menstrual cycle. Reports menstruation regular. Pt reports two new macules appearing on her right breast after a second degree sunburn this summer. Pt states both areas appear to be getting larger. Denies tenderness, erythema, or crusting to the area.    Review of Systems  Constitutional: Positive for fatigue. Negative for activity change, appetite change and fever.  HENT: Negative for dental problem, ear pain, sinus pressure and sore throat.   Respiratory: Negative for cough, chest tightness, shortness of breath and wheezing.   Cardiovascular: Negative for chest pain.  Gastrointestinal: Positive for abdominal pain, constipation and diarrhea. Negative for abdominal distention, blood in stool, nausea and vomiting.  Genitourinary: Positive for menstrual problem and vaginal pain. Negative for difficulty urinating, dysuria, enuresis, frequency, genital sores,  pelvic pain, urgency and vaginal discharge.  Psychiatric/Behavioral: Positive for decreased concentration and sleep disturbance. Negative for suicidal ideas. The patient is nervous/anxious.    Depression screen PHQ 2/9 11/03/2016  Decreased Interest 1  Down, Depressed, Hopeless 1  PHQ - 2 Score 2      Objective:   Physical Exam  Constitutional: She is oriented to person, place, and time. She appears well-developed. No distress.  HENT:  Right Ear: External ear normal.  Left Ear: External ear normal.  Mouth/Throat: Oropharynx is clear and moist.  Eyes: Pupils are equal, round, and reactive to light.  Neck: Normal range of motion. Neck supple. No tracheal deviation present. No thyromegaly present.  Cardiovascular: Normal rate, regular rhythm and normal heart sounds.  Exam reveals no gallop.   No murmur heard. Pulmonary/Chest: Effort normal and breath sounds normal.  Breasts equal in size and shape bilaterally with no deformity noted on inspection. No masses, tenderness, nodularity, edema, or nipple discharge noted on palpation bilaterally. No axillary adenopathy noted bilaterally.  Abdominal: Soft. Bowel sounds are normal. She exhibits no distension. There is tenderness.  Genitourinary: Vagina normal and uterus normal.  Genitourinary Comments: External Genitalia:No lesions, discharge, erythema, or edema noted.  Vagina: Minimal clear/white discharge in the vaginal vault. No lesions, erythema, or abnormalities noted. Cervix normal in appearance. No CMT. Bimanual exam minimal vaginal tenderness or obvious masses.   Musculoskeletal: She exhibits no edema.  Lymphadenopathy:    She has no cervical adenopathy.  Neurological: She is alert and oriented to person, place, and time.  Skin: Skin is warm and dry. No rash noted.  1)Circumscribed brown macule with well defined boarders appx .7cm in circumference at the 1 o'clock position  of the right breast near the sternal border.  2)Circumscribed  brown macule with well defined boarders appx .6cm in circumference at the 2 o'clock position of the right breast near the sternal border.    Psychiatric: She has a normal mood and affect. Her behavior is normal.  Vitals reviewed.  Vitals:   11/03/16 0915  BP: 116/82  Weight: 169 lb 9.6 oz (76.9 kg)  Height: 5\' 9"  (1.753 m)      Assessment & Plan:  1. Well woman exam  2. Anxiety with depression Bupropion ordered; reviewed potential adverse effects; DC med and contact office if any problems  3. Anxiety -Clonazepam ordered   4. Menorrhagia with regular cycle - CBC with Differential/Platelet - Will discuss with lab results at next visit  5. Dysmenorrhea, unspecified - Will discuss at next visit  6. Gastroesophageal reflux disease, esophagitis presence not specified - Pantoprazole ordered - Educated on diet - Hepatic function panel - Basic metabolic panel  7. Irritable bowel syndrome with both constipation and diarrhea - Hepatic function panel - Basic metabolic panel  8. Need for vaccination - Flu Vaccine QUAD 36+ mos IM  9. Other fatigue - Basic metabolic panel - TSH  10. Screening, lipid - Lipid panel  . Meds ordered this encounter  Medications  . buPROPion (WELLBUTRIN XL) 150 MG 24 hr tablet    Sig: Take 1 tablet (150 mg total) by mouth daily.    Dispense:  30 tablet    Refill:  2    Order Specific Question:   Supervising Provider    Answer:   Mikey Kirschner [[id number]]  . clonazePAM (KLONOPIN) 0.5 MG tablet    Sig: Take 1 tablet (0.5 mg total) by mouth 2 (two) times daily as needed for anxiety.    Dispense:  40 tablet    Refill:  0    Order Specific Question:   Supervising Provider    Answer:   Mikey Kirschner [[id number]]  . pantoprazole (PROTONIX) 40 MG tablet    Sig: Take 1 tablet (40 mg total) by mouth daily.    Dispense:  30 tablet    Refill:  2    Order Specific Question:   Supervising Provider    Answer:   Mikey Kirschner [[id number]]    Return in  about 4 weeks (around 12/01/2016) for recheck. Call back sooner if worse.

## 2016-11-03 NOTE — Telephone Encounter (Signed)
We sent in all her meds including Wellbutrin. Sent it in again just to be safe.

## 2016-11-04 NOTE — Telephone Encounter (Signed)
Patient notified

## 2016-11-05 ENCOUNTER — Encounter: Payer: Self-pay | Admitting: Nurse Practitioner

## 2016-11-05 DIAGNOSIS — K219 Gastro-esophageal reflux disease without esophagitis: Secondary | ICD-10-CM | POA: Insufficient documentation

## 2016-11-05 DIAGNOSIS — F419 Anxiety disorder, unspecified: Secondary | ICD-10-CM

## 2016-11-05 DIAGNOSIS — N946 Dysmenorrhea, unspecified: Secondary | ICD-10-CM | POA: Insufficient documentation

## 2016-11-05 DIAGNOSIS — K582 Mixed irritable bowel syndrome: Secondary | ICD-10-CM | POA: Insufficient documentation

## 2016-11-05 DIAGNOSIS — F32A Depression, unspecified: Secondary | ICD-10-CM | POA: Insufficient documentation

## 2016-11-05 DIAGNOSIS — F329 Major depressive disorder, single episode, unspecified: Secondary | ICD-10-CM | POA: Insufficient documentation

## 2016-11-05 DIAGNOSIS — N920 Excessive and frequent menstruation with regular cycle: Secondary | ICD-10-CM | POA: Insufficient documentation

## 2016-11-29 MED FILL — PANTOPRAZOLE SOD DR 40 MG T: 40 | 30 days supply | Qty: 30 | Fill #0

## 2016-11-29 MED FILL — BUPROPION HCL XL 150 MG TAB: 150 | 30 days supply | Qty: 30 | Fill #0

## 2016-12-01 ENCOUNTER — Encounter: Payer: Self-pay | Admitting: Nurse Practitioner

## 2016-12-01 ENCOUNTER — Ambulatory Visit (INDEPENDENT_AMBULATORY_CARE_PROVIDER_SITE_OTHER): Payer: 59 | Admitting: Nurse Practitioner

## 2016-12-01 VITALS — BP 118/82 | Ht 69.0 in | Wt 169.2 lb

## 2016-12-01 DIAGNOSIS — K582 Mixed irritable bowel syndrome: Secondary | ICD-10-CM | POA: Diagnosis not present

## 2016-12-01 DIAGNOSIS — F329 Major depressive disorder, single episode, unspecified: Secondary | ICD-10-CM

## 2016-12-01 DIAGNOSIS — F32A Depression, unspecified: Secondary | ICD-10-CM

## 2016-12-01 DIAGNOSIS — G4700 Insomnia, unspecified: Secondary | ICD-10-CM | POA: Insufficient documentation

## 2016-12-01 DIAGNOSIS — Z13220 Encounter for screening for lipoid disorders: Secondary | ICD-10-CM | POA: Diagnosis not present

## 2016-12-01 DIAGNOSIS — K219 Gastro-esophageal reflux disease without esophagitis: Secondary | ICD-10-CM | POA: Diagnosis not present

## 2016-12-01 DIAGNOSIS — N920 Excessive and frequent menstruation with regular cycle: Secondary | ICD-10-CM | POA: Diagnosis not present

## 2016-12-01 DIAGNOSIS — R5383 Other fatigue: Secondary | ICD-10-CM | POA: Diagnosis not present

## 2016-12-01 DIAGNOSIS — F419 Anxiety disorder, unspecified: Secondary | ICD-10-CM | POA: Diagnosis not present

## 2016-12-01 MED ORDER — TRAZODONE HCL 50 MG PO TABS: 25.0000 mg | ORAL_TABLET | Freq: Every evening | ORAL | 3 refills | Status: DC | PRN

## 2016-12-01 NOTE — Patient Instructions (Signed)
Nexplanon Mirena ablation

## 2016-12-01 NOTE — Progress Notes (Signed)
Subjective: Presents for recheck on her anxiety/depression.  Doing well on Celexa and Wellbutrin.  Continues to be under tremendous amounts of stress.  The only issue now continues to be sleep.  Getting about 5 hours of sleep per night but mostly sporadic/interrupted.  Klonopin worked really well the first night but no results after this.  Her husband had trazodone 100 mg, patient tried 1/2 pill which worked very very well.  No suicidal or homicidal thoughts or ideation.  Has cut back her smoking to half pack per day.  Reflux has been stable.  Continues to have of her irritable bowel syndrome symptoms.  Objective:   BP 118/82   Ht 5\' 9"  (1.753 m)   Wt 169 lb 3.2 oz (76.7 kg)   BMI 24.99 kg/m  NAD.  Alert, oriented.  Cheerful calm affect.  Much improved from previous visit.  Thoughts logical coherent and relevant.  Dressed appropriately.  Lungs clear.  Heart regular rate and rhythm.  Abdomen soft nondistended with mild epigastric area tenderness.  Assessment:   Problem List Items Addressed This Visit      Digestive   Gastroesophageal reflux disease   Irritable bowel syndrome with both constipation and diarrhea     Other   Anxiety and depression - Primary   Relevant Medications   traZODone (DESYREL) 50 MG tablet   Insomnia       Plan:   Meds ordered this encounter  Medications  . traZODone (DESYREL) 50 MG tablet    Sig: Take 0.5-1 tablets (25-50 mg total) at bedtime as needed by mouth for sleep.    Dispense:  30 tablet    Refill:  3    Order Specific Question:   Supervising Provider    Answer:   Mikey Kirschner [[id number]]   Trazodone 50 mg 1/2-1 p.o. nightly as needed sleep.  Continue other medications as directed.  Use Klonopin only for extreme anxiety.  Discussed importance of stress reduction.  Return in about 3 months (around 03/03/2017) for recheck. Call back sooner if any problems.

## 2016-12-02 LAB — HEPATIC FUNCTION PANEL
ALT: 12 IU/L (ref 0–32)
AST: 17 IU/L (ref 0–40)
Albumin: 4.9 g/dL (ref 3.5–5.5)
Alkaline Phosphatase: 59 IU/L (ref 39–117)
BILIRUBIN TOTAL: 0.5 mg/dL (ref 0.0–1.2)
BILIRUBIN, DIRECT: 0.12 mg/dL (ref 0.00–0.40)
Total Protein: 7.3 g/dL (ref 6.0–8.5)

## 2016-12-02 LAB — LIPID PANEL
CHOLESTEROL TOTAL: 176 mg/dL (ref 100–199)
Chol/HDL Ratio: 3.7 ratio (ref 0.0–4.4)
HDL: 47 mg/dL (ref >39)
LDL Calculated: 107 mg/dL — ABNORMAL HIGH (ref 0–99)
TRIGLYCERIDES: 111 mg/dL (ref 0–149)
VLDL Cholesterol Cal: 22 mg/dL (ref 5–40)

## 2016-12-02 LAB — BASIC METABOLIC PANEL
BUN / CREAT RATIO: 12 (ref 9–23)
BUN: 10 mg/dL (ref 6–20)
CHLORIDE: 103 mmol/L (ref 96–106)
CO2: 28 mmol/L (ref 20–29)
Calcium: 9.6 mg/dL (ref 8.7–10.2)
Creatinine, Ser: 0.82 mg/dL (ref 0.57–1.00)
GFR calc Af Amer: 106 mL/min/{1.73_m2} (ref >59)
GFR calc non Af Amer: 92 mL/min/{1.73_m2} (ref >59)
Glucose: 72 mg/dL (ref 65–99)
POTASSIUM: 4.3 mmol/L (ref 3.5–5.2)
SODIUM: 141 mmol/L (ref 134–144)

## 2016-12-02 LAB — CBC WITH DIFFERENTIAL/PLATELET
BASOS ABS: 0 10*3/uL (ref 0.0–0.2)
Basos: 0 %
EOS (ABSOLUTE): 0.1 10*3/uL (ref 0.0–0.4)
Eos: 1 %
HEMOGLOBIN: 13.8 g/dL (ref 11.1–15.9)
Hematocrit: 41 % (ref 34.0–46.6)
IMMATURE GRANULOCYTES: 0 %
Immature Grans (Abs): 0 10*3/uL (ref 0.0–0.1)
LYMPHS ABS: 1.8 10*3/uL (ref 0.7–3.1)
Lymphs: 31 %
MCH: 31.7 pg (ref 26.6–33.0)
MCHC: 33.7 g/dL (ref 31.5–35.7)
MCV: 94 fL (ref 79–97)
MONOCYTES: 10 %
MONOS ABS: 0.6 10*3/uL (ref 0.1–0.9)
NEUTROS PCT: 58 %
Neutrophils Absolute: 3.3 10*3/uL (ref 1.4–7.0)
Platelets: 251 10*3/uL (ref 150–379)
RBC: 4.35 x10E6/uL (ref 3.77–5.28)
RDW: 12.6 % (ref 12.3–15.4)
WBC: 5.7 10*3/uL (ref 3.4–10.8)

## 2016-12-02 LAB — TSH: TSH: 2.22 u[IU]/mL (ref 0.450–4.500)

## 2016-12-08 ENCOUNTER — Encounter: Payer: Self-pay | Admitting: Family Medicine

## 2016-12-08 ENCOUNTER — Ambulatory Visit (INDEPENDENT_AMBULATORY_CARE_PROVIDER_SITE_OTHER): Payer: 59 | Admitting: Family Medicine

## 2016-12-08 VITALS — BP 114/70 | Temp 98.8°F | Ht 69.0 in | Wt 170.0 lb

## 2016-12-08 DIAGNOSIS — J019 Acute sinusitis, unspecified: Secondary | ICD-10-CM

## 2016-12-08 DIAGNOSIS — R6889 Other general symptoms and signs: Secondary | ICD-10-CM | POA: Diagnosis not present

## 2016-12-08 MED ORDER — AMOXICILLIN-POT CLAVULANATE 875-125 MG PO TABS: 1.0000 | ORAL_TABLET | Freq: Two times a day (BID) | ORAL | 0 refills | Status: DC

## 2016-12-08 MED ORDER — CEFTRIAXONE SODIUM 500 MG IJ SOLR
500.0000 mg | Freq: Once | INTRAMUSCULAR | Status: AC
  Administered 2016-12-08: 500 mg via INTRAMUSCULAR

## 2016-12-08 NOTE — Progress Notes (Signed)
   Subjective:    Patient ID: Lisa Fields, female    DOB: [birth year], 36 y.o.   MRN: [id number]  Sinusitis  This is a new problem. Episode onset: 4 days. Associated symptoms include congestion, coughing and headaches. (Fever) Treatments tried: mucinex.   Viral-like illness for multiple days congestion drainage body aches eyeballs aching legs aching runny nose cough low-grade fever in addition to this there was sinus pressure pain discomfort no wheezing or difficulty breathing Patient does smoke  Review of Systems  HENT: Positive for congestion.   Respiratory: Positive for cough.   Neurological: Positive for headaches.       Objective:   Physical Exam  Patient does not appear toxic but she does look like she does not feel well eardrums normal throat is normal neck no masses lungs clear heart regular      Assessment & Plan:  Viral syndrome Secondary rhinosinusitis parainfluenza versus flulike illness Augmentin twice daily 10 days Rocephin 500 mg IM Stay home from work next few days If progressive troubles or if worse follow-up

## 2016-12-27 ENCOUNTER — Other Ambulatory Visit: Payer: Self-pay | Admitting: Family Medicine

## 2016-12-29 ENCOUNTER — Telehealth: Payer: Self-pay

## 2016-12-29 NOTE — Telephone Encounter (Signed)
Patient called this am states she fell walking the dogs this am. She has injured the top of her foot and there is pain radiating from the foot up her leg. I advised that we did not have any availability she would need to see the ed or the urgent care. She expressed understanding.

## 2017-02-14 IMAGING — DX DG KNEE COMPLETE 4+V*R*
4 series · 4 of 4 positions shown · non-contrast
Comparison: None.

CLINICAL DATA: Pain in the back of the right knee radiating into
the lower leg with mild swelling after sudden weakness in the knee
when running this morning at [DATE] a.m..

EXAM:
RIGHT KNEE - COMPLETE 4+ VIEW

[knee ap]
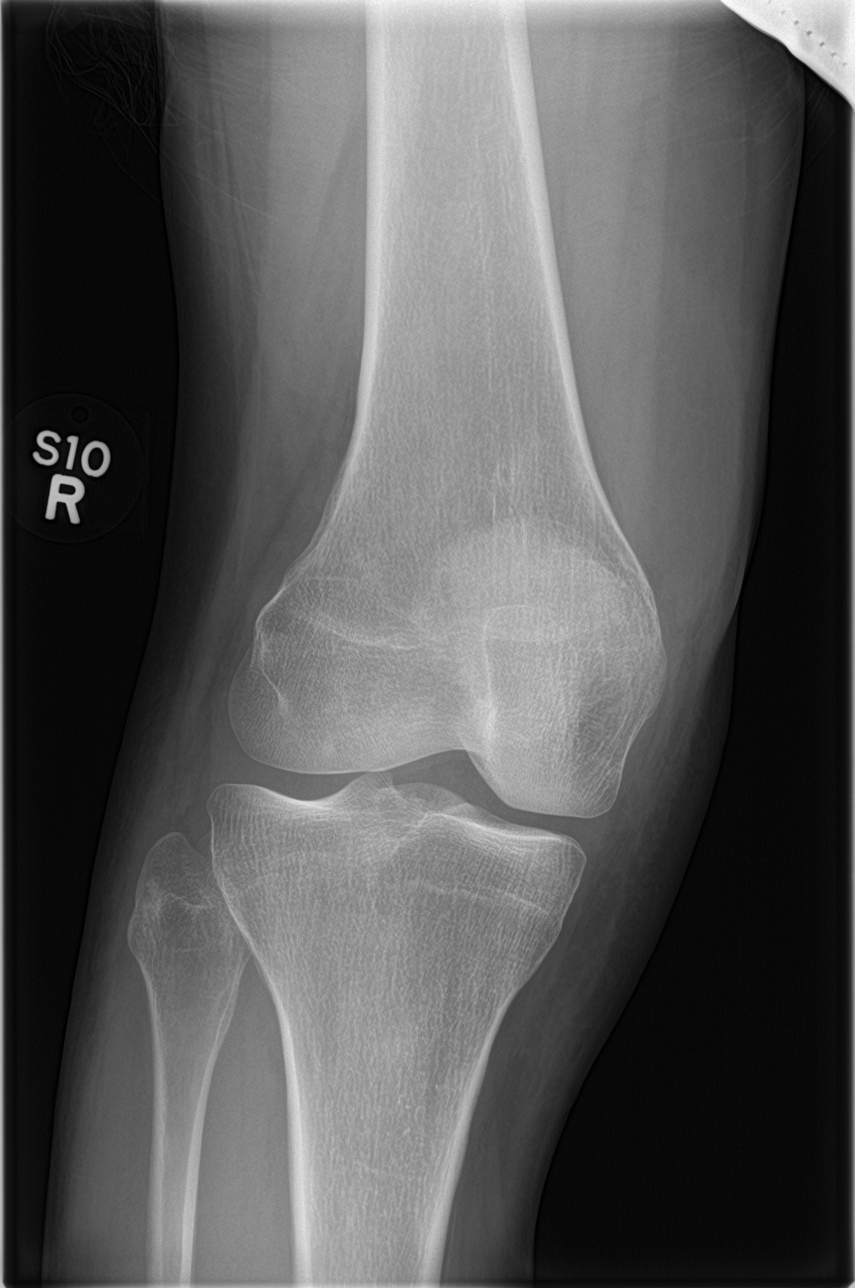

[knee obl (1 of 2)]
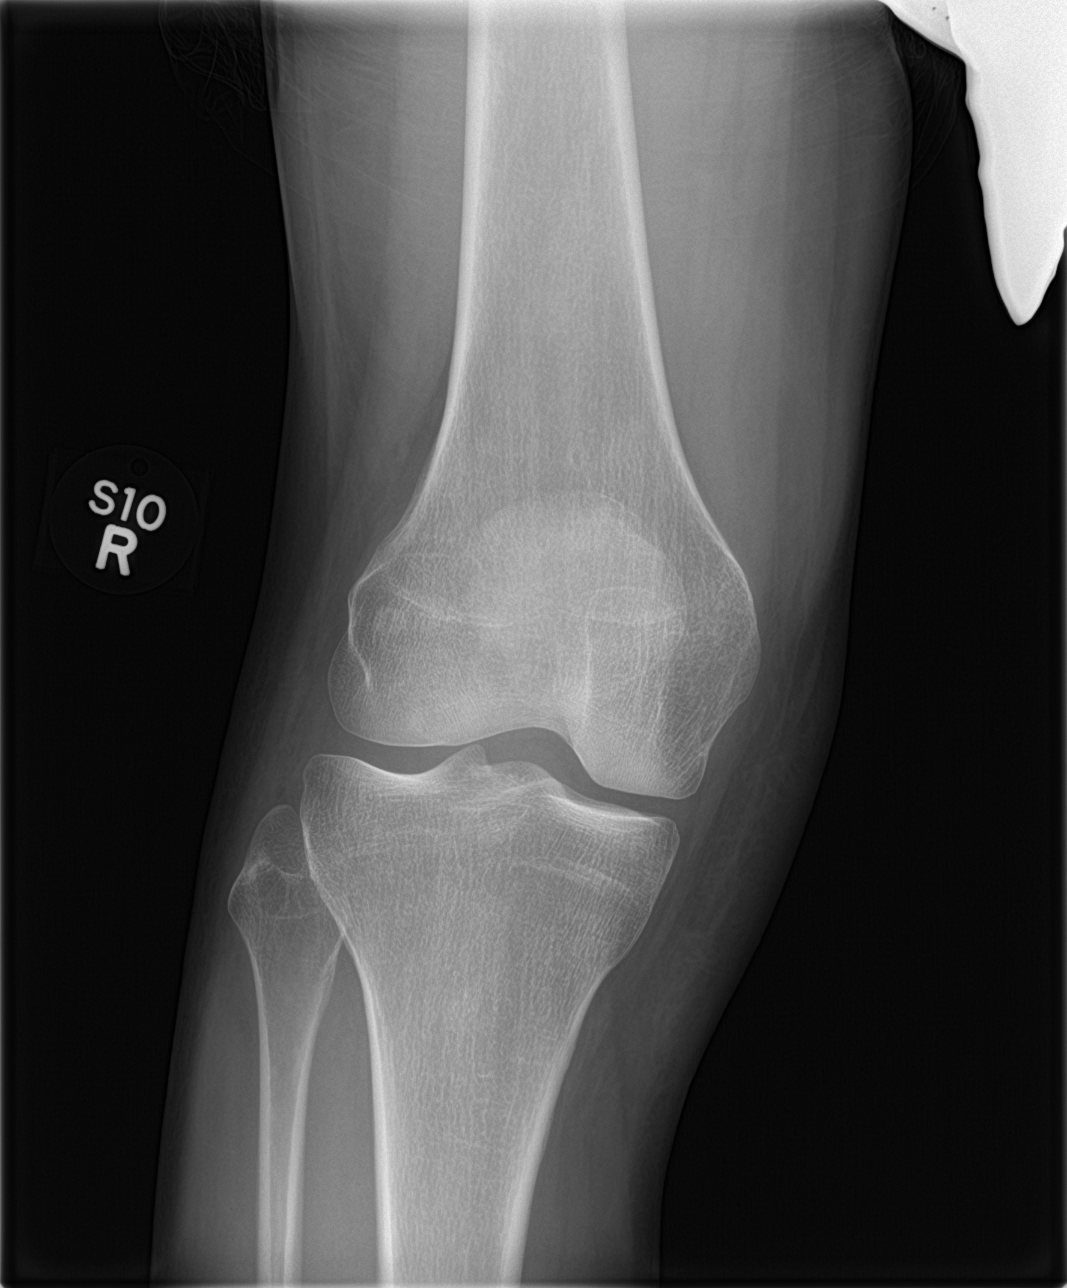

[knee obl (2 of 2)]
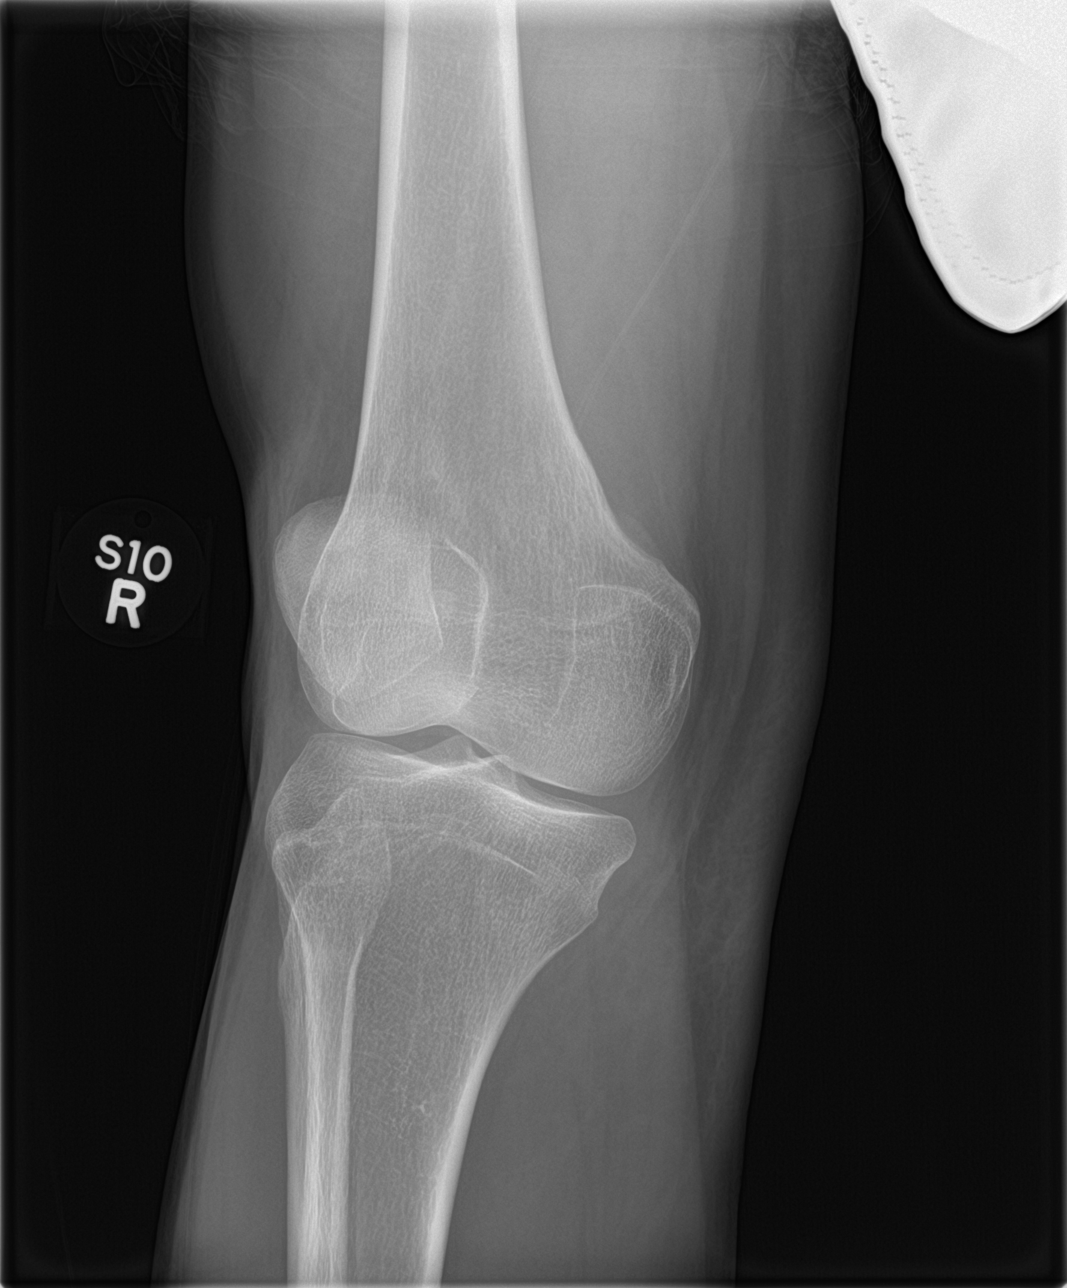

[knee lat]
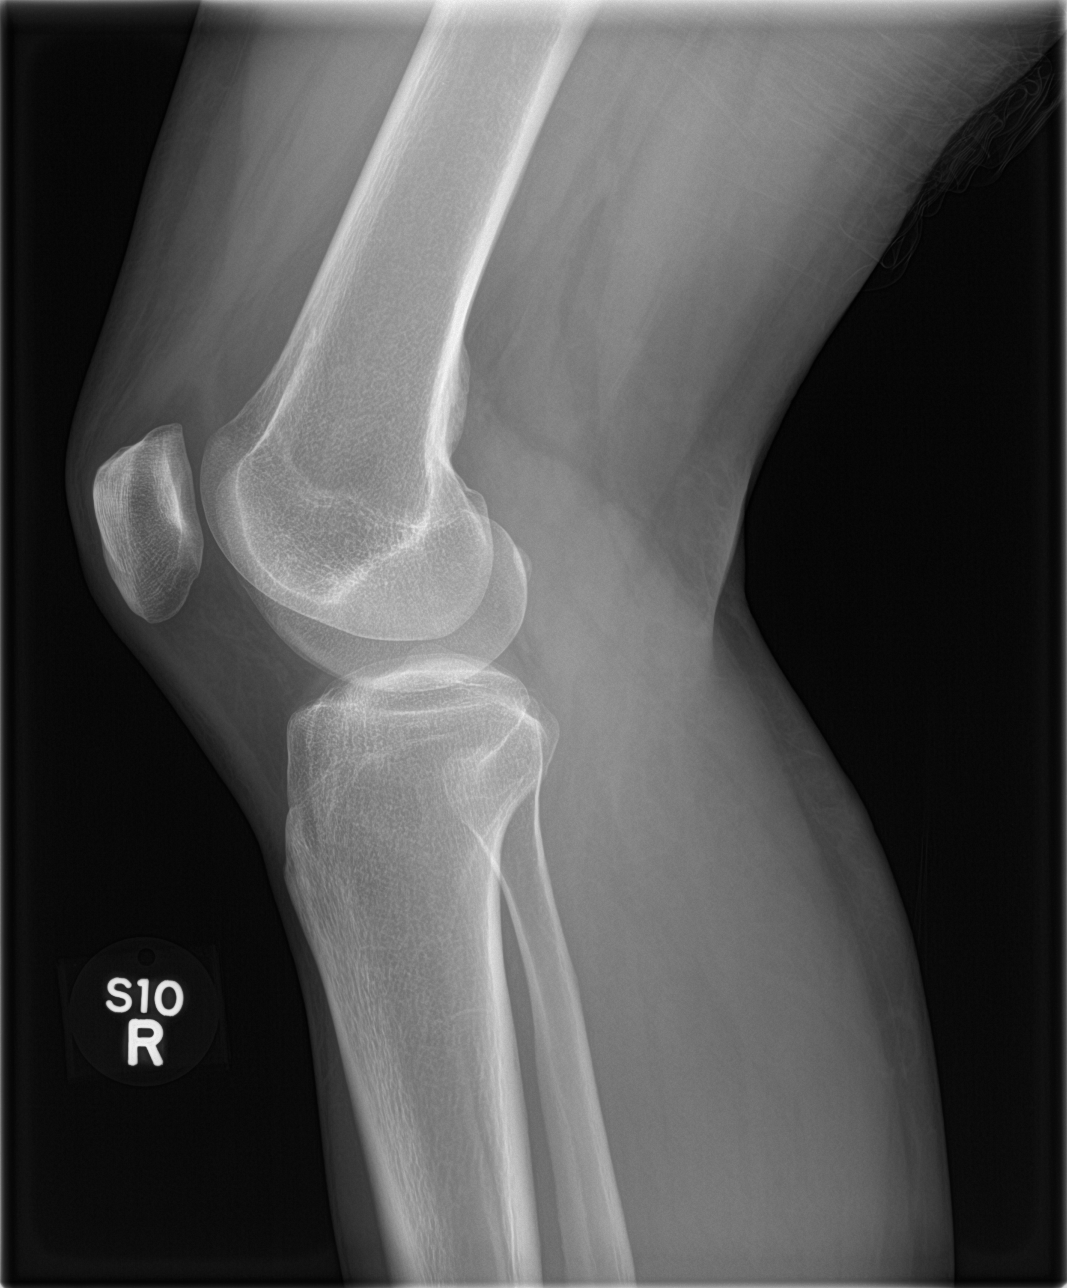

[4 of 4 positions shown; findings below may reference images not displayed]

FINDINGS: There is no evidence of fracture, dislocation, or joint effusion.
There is no evidence of arthropathy or other focal bone abnormality.
Soft tissues are unremarkable.
IMPRESSION: Normal examination.

## 2017-03-02 ENCOUNTER — Ambulatory Visit: Payer: 59 | Admitting: Nurse Practitioner

## 2017-03-11 ENCOUNTER — Ambulatory Visit (INDEPENDENT_AMBULATORY_CARE_PROVIDER_SITE_OTHER): Payer: 59 | Admitting: Nurse Practitioner

## 2017-03-11 ENCOUNTER — Encounter: Payer: Self-pay | Admitting: Nurse Practitioner

## 2017-03-11 VITALS — BP 106/78 | Ht 69.0 in | Wt 173.4 lb

## 2017-03-11 DIAGNOSIS — R252 Cramp and spasm: Secondary | ICD-10-CM | POA: Diagnosis not present

## 2017-03-11 DIAGNOSIS — F329 Major depressive disorder, single episode, unspecified: Secondary | ICD-10-CM | POA: Diagnosis not present

## 2017-03-11 DIAGNOSIS — F419 Anxiety disorder, unspecified: Secondary | ICD-10-CM

## 2017-03-11 DIAGNOSIS — K5900 Constipation, unspecified: Secondary | ICD-10-CM | POA: Diagnosis not present

## 2017-03-11 MED ORDER — CITALOPRAM HYDROBROMIDE 40 MG PO TABS: ORAL_TABLET | ORAL | 1 refills | Status: DC

## 2017-03-11 MED FILL — CITALOPRAM HBR 40 MG TABLET: 40 | 90 days supply | Qty: 90 | Fill #0

## 2017-03-11 NOTE — Patient Instructions (Signed)

## 2017-03-12 ENCOUNTER — Encounter: Payer: Self-pay | Admitting: Nurse Practitioner

## 2017-03-12 NOTE — Progress Notes (Signed)
Subjective:  Presents for recheck on her anxiety and depression. Has switched jobs which has greatly decreased her stress. Continue to deal with significant family issues. Rare Klonopin. Has a history of IBS, now in a constipation phase. Has tried multiple OTC treatments including fiber supplements with minimal improvement. Also c/o bilateral calf cramping with prolonged walking with her dogs. Usually takes about 30 minutes to resolve.   Objective:   BP 106/78   Ht 5\' 9"  (1.753 m)   Wt 173 lb 6.4 oz (78.7 kg)   BMI 25.61 kg/m  NAD. Alert, oriented. Lungs clear. Heart RRR. Femoral pulses strong bilat. Feet warm with strong DP pulses bilat and normal cap refill. Abdomen soft with mild generalized lower abdominal tenderness. No rebound or guarding, no obvious masses.  Potassium on 11/8 was 4.3.  Assessment:  Anxiety and depression  Exercise-induced leg cramps  Constipation, unspecified constipation type    Plan:   Meds ordered this encounter  Medications  . citalopram (CELEXA) 40 MG tablet    Sig: TAKE 1 TABLET BY MOUTH ONCE DAILY    Dispense:  90 tablet    Refill:  1    Order Specific Question:   Supervising Provider    Answer:   Mikey Kirschner [[id number]]   Continue current medications. Recommend patient consider Linzess but states she cannot afford this along with paying for her daughter's medication. Recommend Fleets enema or mag citrate for severe constipation. Continue daily fiber supplement.  Return in about 4 months (around 07/09/2017) for recheck. Call back sooner if any problems.

## 2017-04-14 MED FILL — buPROPion HCL ER (XL) 150 M: 150 | 30 days supply | Qty: 30 | Fill #1

## 2017-04-26 ENCOUNTER — Ambulatory Visit (INDEPENDENT_AMBULATORY_CARE_PROVIDER_SITE_OTHER): Payer: 59 | Admitting: Family Medicine

## 2017-04-26 ENCOUNTER — Encounter: Payer: Self-pay | Admitting: Family Medicine

## 2017-04-26 VITALS — BP 100/58 | Temp 98.9°F | Ht 69.0 in | Wt 175.0 lb

## 2017-04-26 DIAGNOSIS — S80861A Insect bite (nonvenomous), right lower leg, initial encounter: Secondary | ICD-10-CM

## 2017-04-26 DIAGNOSIS — W57XXXA Bitten or stung by nonvenomous insect and other nonvenomous arthropods, initial encounter: Secondary | ICD-10-CM

## 2017-04-26 DIAGNOSIS — L03115 Cellulitis of right lower limb: Secondary | ICD-10-CM

## 2017-04-26 MED ORDER — DOXYCYCLINE HYCLATE 100 MG PO TABS: 100.0000 mg | ORAL_TABLET | Freq: Two times a day (BID) | ORAL | 0 refills | Status: DC

## 2017-04-26 NOTE — Progress Notes (Signed)
   Subjective:    Patient ID: Lisa Fields, female    DOB: 11-Jun-1980, 37 y.o.   MRN: [id number]  HPI Patient is her today with complaints of a second tick bite she received this past Saturday. She had one prior to this on 04/10/2017 on rt lower leg. She reports the bite she received on Saturday in between her legs on the right leg.She now states it red and raised itchy,tingles and burns and now feels like she tired and drained and back pain that radiates down her legs.Also complaints of a headache for two days now.Hx of migraine. Patient denies runny nose wheezing difficulty breathing denies nausea vomiting  Review of Systems  Constitutional: Positive for chills and fatigue. Negative for activity change and fever.  HENT: Negative for congestion, ear pain and rhinorrhea.   Eyes: Negative for discharge.  Respiratory: Negative for cough, shortness of breath and wheezing.   Cardiovascular: Negative for chest pain.  Musculoskeletal: Positive for myalgias.       Objective:   Physical Exam  Constitutional: She appears well-nourished. No distress.  HENT:  Head: Normocephalic.  Right Ear: External ear normal.  Left Ear: External ear normal.  Eyes: Right eye exhibits no discharge. Left eye exhibits no discharge.  Neck: No tracheal deviation present.  Cardiovascular: Normal rate, regular rhythm and normal heart sounds.  No murmur heard. Pulmonary/Chest: Effort normal and breath sounds normal. No respiratory distress. She has no wheezes. She has no rales.  Musculoskeletal: She exhibits no edema.  Lymphadenopathy:    She has no cervical adenopathy.  Neurological: She is alert.  Psychiatric: Her behavior is normal.  Vitals reviewed.   Patient showed me a picture from her cell phone which showed a large area of redness around the tick bite This redness has shrunk down to a small area Patient has tick bite on inner aspect of the thigh and also lower leg  Staff nurse present during  examination      Assessment & Plan:  Tick bite Localized cellulitis Improving some today Flulike symptoms probable related to the tick bite Patient not toxic Doxycycline 100 mg twice daily for the next 10 days Hold off on any lab work If progressive symptoms or if worse to follow-up

## 2017-05-03 ENCOUNTER — Telehealth: Payer: Self-pay | Admitting: Family Medicine

## 2017-05-03 NOTE — Telephone Encounter (Signed)
Pt states that her cellulitis and the tick bite area have went away/looking better but she is still tired, neck hurting, headaches, and muscle cramps have not got any better. She is taking Doxycycline and has 3 days left of them.

## 2017-05-03 NOTE — Telephone Encounter (Signed)
Pt called stating that the medication that she was prescribed for her migraines is not working. Please advise.

## 2017-05-04 ENCOUNTER — Other Ambulatory Visit: Payer: Self-pay | Admitting: Family Medicine

## 2017-05-04 DIAGNOSIS — W57XXXA Bitten or stung by nonvenomous insect and other nonvenomous arthropods, initial encounter: Secondary | ICD-10-CM

## 2017-05-04 DIAGNOSIS — L03115 Cellulitis of right lower limb: Secondary | ICD-10-CM

## 2017-05-04 NOTE — Telephone Encounter (Signed)
The patient came in with her daughter who is having symptoms-I talked with the patient about her symptoms she was having body aches headaches no fever fatigue tiredness but the cellulitis was doing better she also noticed how she was having some issues of nausea and vomiting with red meat-therefore we ordered lab testing and she will do these await the results-avoid red meats could have developed allergy related to tick bite

## 2017-05-11 ENCOUNTER — Encounter: Payer: Self-pay | Admitting: Family Medicine

## 2017-05-11 DIAGNOSIS — Z91014 Allergy to mammalian meats: Secondary | ICD-10-CM

## 2017-05-11 DIAGNOSIS — Z91018 Allergy to other foods: Secondary | ICD-10-CM | POA: Insufficient documentation

## 2017-05-11 HISTORY — DX: Allergy to mammalian meats: Z91.014

## 2017-05-11 LAB — ALPHA-GAL PANEL
ALPHA GAL IGE: 0.51 kU/L — AB (ref <0.10)
Beef (Bos spp) IgE: 0.3 kU/L (ref <0.35)
LAMB CLASS INTERPRETATION: 0
Lamb/Mutton (Ovis spp) IgE: <0.1 kU/L (ref <0.35)
Pork (Sus spp) IgE: 0.2 kU/L (ref <0.35)

## 2017-05-11 LAB — LYME, WESTERN BLOT, SERUM (REFLEXED)
IGG P18 AB.: ABSENT
IGG P28 AB.: ABSENT
IGG P30 AB.: ABSENT
IGG P41 AB.: ABSENT
IGG P45 AB.: ABSENT
IGG P93 AB.: ABSENT
IgG P23 Ab.: ABSENT
IgG P39 Ab.: ABSENT
IgG P58 Ab.: ABSENT
IgG P66 Ab.: ABSENT
IgM P23 Ab.: ABSENT
IgM P41 Ab.: ABSENT
LYME IGG WB: NEGATIVE
Lyme IgM Wb: NEGATIVE

## 2017-05-11 LAB — CBC WITH DIFFERENTIAL/PLATELET
BASOS ABS: 0 10*3/uL (ref 0.0–0.2)
Basos: 1 %
EOS (ABSOLUTE): 0.1 10*3/uL (ref 0.0–0.4)
Eos: 2 %
HEMOGLOBIN: 13 g/dL (ref 11.1–15.9)
Hematocrit: 39.8 % (ref 34.0–46.6)
IMMATURE GRANS (ABS): 0 10*3/uL (ref 0.0–0.1)
IMMATURE GRANULOCYTES: 0 %
LYMPHS: 38 %
Lymphocytes Absolute: 1.9 10*3/uL (ref 0.7–3.1)
MCH: 30.7 pg (ref 26.6–33.0)
MCHC: 32.7 g/dL (ref 31.5–35.7)
MCV: 94 fL (ref 79–97)
MONOCYTES: 9 %
Monocytes Absolute: 0.5 10*3/uL (ref 0.1–0.9)
NEUTROS PCT: 50 %
Neutrophils Absolute: 2.5 10*3/uL (ref 1.4–7.0)
PLATELETS: 267 10*3/uL (ref 150–379)
RBC: 4.23 x10E6/uL (ref 3.77–5.28)
RDW: 13 % (ref 12.3–15.4)
WBC: 5 10*3/uL (ref 3.4–10.8)

## 2017-05-11 LAB — EHRLICHIA ANTIBODY PANEL
E. CHAFFEENSIS (HME) IGM TITER: NEGATIVE
E. CHAFFEENSIS IGG AB: NEGATIVE
HGE IGG TITER: NEGATIVE
HGE IgM Titer: NEGATIVE

## 2017-05-11 LAB — CK: CK TOTAL: 75 U/L (ref 24–173)

## 2017-05-11 LAB — LYME AB/WESTERN BLOT REFLEX: LYME DISEASE AB, QUANT, IGM: 1 {index} — AB (ref 0.00–0.79)

## 2017-05-12 ENCOUNTER — Other Ambulatory Visit: Payer: Self-pay | Admitting: *Deleted

## 2017-05-12 DIAGNOSIS — Z91014 Allergy to mammalian meats: Secondary | ICD-10-CM

## 2017-05-12 DIAGNOSIS — Z91018 Allergy to other foods: Secondary | ICD-10-CM

## 2017-05-12 MED ORDER — EPINEPHRINE 0.3 MG/0.3ML IJ SOAJ: 0.3000 mg | Freq: Once | INTRAMUSCULAR | 0 refills | Status: AC

## 2017-05-12 MED FILL — EPINEPHRINE 0.3 MG AUTO-INJ: 0.3 | 30 days supply | Qty: 2 | Fill #0

## 2017-05-16 ENCOUNTER — Encounter (INDEPENDENT_AMBULATORY_CARE_PROVIDER_SITE_OTHER): Payer: Self-pay

## 2017-05-16 ENCOUNTER — Encounter: Payer: Self-pay | Admitting: Family Medicine

## 2017-05-30 ENCOUNTER — Telehealth: Payer: Self-pay

## 2017-05-30 NOTE — Telephone Encounter (Signed)
Last seen 04/26/2017 for tick bite. Wants Bupropion 150 mg one daily # 30 with 5 refills send to [facility].Please advise.

## 2017-05-31 ENCOUNTER — Encounter: Payer: Self-pay | Admitting: Allergy & Immunology

## 2017-05-31 NOTE — Telephone Encounter (Signed)
She was seen for care of by [facility] in February.  It is fine to go ahead and give a refill and also 4 refills but will need follow-up office visit in September

## 2017-06-01 ENCOUNTER — Other Ambulatory Visit: Payer: Self-pay | Admitting: Family Medicine

## 2017-06-01 MED ORDER — BUPROPION HCL ER (XL) 150 MG PO TB24: 150.0000 mg | ORAL_TABLET | Freq: Every day | ORAL | 4 refills | Status: DC

## 2017-06-01 NOTE — Telephone Encounter (Signed)
Med sent in to [facility]. Left message on pts voicemail to return call

## 2017-06-15 ENCOUNTER — Encounter: Payer: Self-pay | Admitting: Family Medicine

## 2017-06-15 ENCOUNTER — Ambulatory Visit (HOSPITAL_COMMUNITY)
Admission: RE | Admit: 2017-06-15 | Discharge: 2017-06-15 | Disposition: A | Discharge status: Home / Self Care | Payer: 59 | Source: Ambulatory Visit | Attending: Family Medicine | Admitting: Family Medicine

## 2017-06-15 ENCOUNTER — Other Ambulatory Visit (HOSPITAL_COMMUNITY)
Admission: RE | Admit: 2017-06-15 | Discharge: 2017-06-15 | Disposition: A | Discharge status: Home / Self Care | Payer: 59 | Source: Ambulatory Visit | Attending: Family Medicine | Admitting: Family Medicine

## 2017-06-15 ENCOUNTER — Ambulatory Visit (INDEPENDENT_AMBULATORY_CARE_PROVIDER_SITE_OTHER): Payer: 59 | Admitting: Family Medicine

## 2017-06-15 VITALS — BP 122/70 | Temp 100.9°F | Ht 69.0 in | Wt 175.0 lb

## 2017-06-15 DIAGNOSIS — R509 Fever, unspecified: Secondary | ICD-10-CM | POA: Diagnosis not present

## 2017-06-15 DIAGNOSIS — J019 Acute sinusitis, unspecified: Secondary | ICD-10-CM | POA: Insufficient documentation

## 2017-06-15 DIAGNOSIS — R05 Cough: Secondary | ICD-10-CM | POA: Diagnosis not present

## 2017-06-15 DIAGNOSIS — W57XXXA Bitten or stung by nonvenomous insect and other nonvenomous arthropods, initial encounter: Secondary | ICD-10-CM

## 2017-06-15 LAB — HEPATIC FUNCTION PANEL
ALT: 14 U/L (ref 14–54)
AST: 18 U/L (ref 15–41)
Albumin: 4.7 g/dL (ref 3.5–5.0)
Alkaline Phosphatase: 67 U/L (ref 38–126)
BILIRUBIN INDIRECT: 0.8 mg/dL (ref 0.3–0.9)
Bilirubin, Direct: 0.1 mg/dL (ref 0.1–0.5)
TOTAL PROTEIN: 8.1 g/dL (ref 6.5–8.1)
Total Bilirubin: 0.9 mg/dL (ref 0.3–1.2)

## 2017-06-15 LAB — BASIC METABOLIC PANEL
Anion gap: 8 (ref 5–15)
BUN: 7 mg/dL (ref 6–20)
CALCIUM: 9.5 mg/dL (ref 8.9–10.3)
CHLORIDE: 102 mmol/L (ref 101–111)
CO2: 29 mmol/L (ref 22–32)
CREATININE: 0.72 mg/dL (ref 0.44–1.00)
GFR calc Af Amer: >60 mL/min (ref >60)
Glucose, Bld: 100 mg/dL — ABNORMAL HIGH (ref 65–99)
Potassium: 3.7 mmol/L (ref 3.5–5.1)
SODIUM: 139 mmol/L (ref 135–145)

## 2017-06-15 LAB — CBC WITH DIFFERENTIAL/PLATELET
BASOS ABS: 0 10*3/uL (ref 0.0–0.1)
Basophils Relative: 0 %
EOS PCT: 1 %
Eosinophils Absolute: 0.1 10*3/uL (ref 0.0–0.7)
HEMATOCRIT: 40.9 % (ref 36.0–46.0)
Hemoglobin: 14 g/dL (ref 12.0–15.0)
LYMPHS PCT: 14 %
Lymphs Abs: 1.7 10*3/uL (ref 0.7–4.0)
MCH: 31.4 pg (ref 26.0–34.0)
MCHC: 34.2 g/dL (ref 30.0–36.0)
MCV: 91.7 fL (ref 78.0–100.0)
Monocytes Absolute: 1.2 10*3/uL — ABNORMAL HIGH (ref 0.1–1.0)
Monocytes Relative: 11 %
NEUTROS ABS: 8.4 10*3/uL — AB (ref 1.7–7.7)
Neutrophils Relative %: 74 %
PLATELETS: 253 10*3/uL (ref 150–400)
RBC: 4.46 MIL/uL (ref 3.87–5.11)
RDW: 12 % (ref 11.5–15.5)
WBC: 11.4 10*3/uL — AB (ref 4.0–10.5)

## 2017-06-15 MED ORDER — DOXYCYCLINE HYCLATE 100 MG PO TABS: 100.0000 mg | ORAL_TABLET | Freq: Two times a day (BID) | ORAL | 0 refills | Status: DC

## 2017-06-15 MED ORDER — BUPROPION HCL ER (XL) 150 MG PO TB24: 150.0000 mg | ORAL_TABLET | Freq: Every day | ORAL | 4 refills | Status: DC

## 2017-06-15 MED ORDER — ONDANSETRON 8 MG PO TBDP: 8.0000 mg | ORAL_TABLET | Freq: Three times a day (TID) | ORAL | 0 refills | Status: DC | PRN

## 2017-06-15 NOTE — Progress Notes (Signed)
   Subjective:    Patient ID: Lisa Fields, female    DOB: 1980-12-25, 37 y.o.   MRN: [id number]  Sinusitis  This is a new problem. Episode onset: 3 days. Associated symptoms include congestion, coughing, headaches and a sore throat. Pertinent negatives include no ear pain or shortness of breath. (Fever, vomiting, achy all over) Past treatments include acetaminophen (mucinex sinus max).   Pulled a tick off of left upper thigh.  Patient states this area did not cause any redness or swelling or tenderness  Patient had tick related illness for approximately a month and a half ago Patient started on Monday with head congestion drainage sore throat then with headache coughing congestion feels a little tight in the lungs in addition to this fever and chills she also relates that she started experiencing increased neck pain she states her neck is been bothering her ever since her to get illness in April but now it seems to be aching more  Review of Systems  Constitutional: Negative for activity change and fever.  HENT: Positive for congestion, rhinorrhea and sore throat. Negative for ear pain.   Eyes: Negative for discharge.  Respiratory: Positive for cough. Negative for shortness of breath and wheezing.   Cardiovascular: Negative for chest pain.  Neurological: Positive for headaches.       Objective:   Physical Exam  Constitutional: She appears well-developed.  HENT:  Head: Normocephalic.  Nose: Nose normal.  Mouth/Throat: Oropharynx is clear and moist. No oropharyngeal exudate.  Neck: Neck supple.  Cardiovascular: Normal rate and normal heart sounds.  No murmur heard. Pulmonary/Chest: Effort normal and breath sounds normal. She has no wheezes.  Lymphadenopathy:    She has no cervical adenopathy.  Skin: Skin is warm and dry.  Nursing note and vitals reviewed.         Assessment & Plan:  Viral syndrome Flulike illness Febrile illness Sinusitis Antibiotics will be  indicated, I advised for the patient be on 10 days but we need first to check white blood count chest x-ray Her neck is flexible but she does have increased pain and discomfort with flexion and extension but I would not categorize this as nuchal rigidity.  If this gets worse she will need to be checked at the ER for possible lumbar puncture if it gets worse

## 2017-07-08 ENCOUNTER — Ambulatory Visit: Payer: 59 | Admitting: Nurse Practitioner

## 2017-08-03 ENCOUNTER — Encounter: Payer: Self-pay | Admitting: Family Medicine

## 2017-08-09 ENCOUNTER — Ambulatory Visit (INDEPENDENT_AMBULATORY_CARE_PROVIDER_SITE_OTHER): Payer: 59 | Admitting: Allergy & Immunology

## 2017-08-09 ENCOUNTER — Encounter: Payer: Self-pay | Admitting: Allergy & Immunology

## 2017-08-09 VITALS — BP 110/74 | HR 64 | Temp 98.3°F | Resp 16 | Ht 68.5 in | Wt 172.8 lb

## 2017-08-09 DIAGNOSIS — T7800XD Anaphylactic reaction due to unspecified food, subsequent encounter: Secondary | ICD-10-CM

## 2017-08-09 DIAGNOSIS — T7800XA Anaphylactic reaction due to unspecified food, initial encounter: Secondary | ICD-10-CM | POA: Insufficient documentation

## 2017-08-09 DIAGNOSIS — J310 Chronic rhinitis: Secondary | ICD-10-CM

## 2017-08-09 DIAGNOSIS — J3089 Other allergic rhinitis: Secondary | ICD-10-CM | POA: Diagnosis not present

## 2017-08-09 MED ORDER — TRIAMCINOLONE ACETONIDE 55 MCG/ACT NA AERO: 2.0000 | INHALATION_SPRAY | Freq: Every day | NASAL | 12 refills | Status: DC

## 2017-08-09 NOTE — Addendum Note (Signed)
Addended by: Valentina Shaggy on: 08/09/2017 11:36 AM   Modules accepted: Orders

## 2017-08-09 NOTE — Progress Notes (Signed)
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 Bartell is a delightful 37 year old female presenting for an allergy evaluation.  Her symptoms regarding her sensitivity to red meat seem to have started following a tick bite in April.  Surprisingly, her alpha gal IgE was on the low side.  It is likely that she was in the midst of seroconversion when the test was sent.  In any case, her symptoms are certainly consistent with alpha gal sensitivity.  We did discuss the natural course of alpha gal sensitivity and we will plan to retest in the spring 2020 to see where her IgE level is headed.  There certainly are clinical reports of alpha gal resolution and we have had several successful red meat challenges in our office.  Her tuna testing was negative today, and this combined with the fact that she can tolerate other fish with scales makes it highly likely that she will tolerate tuna.  I did recommend that she introduce this at home with her EpiPen handy.  We did not have cheery on our food allergy panel, so we will get blood work to look into this.  She also has a long-standing history of allergic rhinitis symptoms.  These have not seem to bother her too much over the years and they have remained controlled with Allegra. However, over time especially within the last 6 months, her symptoms have worsened.  Testing today is only revealing for a dust mite sensitivity.  We will get an environmental allergy panel to see if there is evidence of other sensitizations.  We are adding a nasal steroid to see if this can help prevent both her postnasal drip as well as her propensity to develop sinusitis.  She has no other history of infections, therefore I think we can hold off on an immune deficiency work-up  at this time.  Plan/Recommendations:   1. Chronic rhinitis - Testing today showed: dust mites - Avoidance measures provided. - We will get lab work to confirm this and call you in 1-2 weeks with the results.  - Continue with: Allegra (fexofenadine) 180mg  table once daily - Start taking: Nasacort (triamcinolone) two sprays per nostril daily - You can use an extra dose of the antihistamine, if needed, for breakthrough symptoms.  - Consider nasal saline rinses 1-2 times daily to remove allergens from the nasal cavities as well as help with mucous clearance (this is especially helpful to do before the nasal sprays are given) - Consider allergy shots as a means of long-term control. - Allergy shots "re-train" and "reset" the immune system to ignore environmental allergens and decrease the resulting immune response to those allergens (sneezing, itchy watery eyes, runny nose, nasal congestion, etc).    - Allergy shots improve symptoms in 75-85% of patients.  - We can discuss more at the next appointment if the medications are not working for you.  2. 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 cherry, ? tuna) - Testing was negative to tuna today, and since you can eat other fin fish without a problem, I anticipate that you have outgrown your tuna allergy. - Introduce at home with your EpiPen close by. - We will get blood work to look for evidence of a cherry allergy and call you in 1-2 weeks  with the results.  - We will plan to retest for alpha gal in 1 year to see where your allergy level is heading. - In the meantime, continue to avoid mammalian meats in all forms. - Anaphylaxis management plan provided.  3. Return in about 3 months (around 11/09/2017).   Subjective:   MERIEL KELLIHER is a 37 y.o. female presenting today for evaluation of  Chief Complaint  Patient presents with  . Food Intolerance    Positive Alpha Gal per blood work  . Nasal Congestion    LASONYA HUBNER has a history of the following: Patient Active Problem List   Diagnosis Date Noted  . Perennial allergic rhinitis 08/09/2017  . Anaphylactic shock due to adverse food reaction 08/09/2017  . 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 05/11/2017  . Insomnia 12/01/2016  . Anxiety and depression 11/05/2016  . Menorrhagia with regular cycle 11/05/2016  . Dysmenorrhea, unspecified 11/05/2016  . Gastroesophageal reflux disease 11/05/2016  . Irritable bowel syndrome with both constipation and diarrhea 11/05/2016  . Fatigue 11/10/2015  . Myalgia 11/10/2015  . Fibromyalgia 11/10/2015  . Cervical lymphadenitis 11/10/2015  . Smoker 11/10/2015  . Chronic fatigue 11/10/2015  . Menorrhagia with irregular cycle 10/17/2014  . Depression 08/27/2014    History obtained from: chart review and patient.  SREEJA SPIES was referred by Kathyrn Drown, MD.     Brizza is a 37 y.o. female presenting for an evaluation of alpha gal and other allergies.    She was bit by a tick in early April and she removed it. Two weeks later, she had another bite from a tick and she developed a circular rash. She was treated with doxycycline. Then she ate a burger from [employer] and it "darn nearly killed [her]". She developed throat swelling a couple of hours later around the same time that she developed urticaria. She went to the ED and she received steroids and antihistamines. She did get an EpiPen which she keeps with her and another at work.   She had blood work in April 2019 that demonstrated an IgE level of 0.51 to alpha gal.  She also had a work-up for Lyme and her lochia, both of which were negative. She has been avoiding all mammalian meat since that time and has had no reactions.   Andria also reports that she has itching when she is around her dog. She has had the dog for three years. She also has sneezing and itchy watery eyes which have been ongoing. She has had that her entire life. She has never been allergy tested but her  brother has a lot of allergies. She takes Allegra every day which helps more with her eyes and throat. She does have a marked worsening of her symptoms since stopping her antihistamines.   She develops hives with tuna as well as cherries. This was when she was a child and she has avoided it since that time. She can tolerate other fish and shellfish without a problem. She can otherwise tolerate other fruits and she has not tried [employer]. She otherwise tolerates all of the major food allergens without adverse event. She does have indigestion with exposure to lettuce.   She does have hives with being bitten by fleas and mosquitoes. Otherwise, there is no history of other atopic diseases, including asthma, drug allergies, food allergies, environmental allergies, stinging insect allergies, or urticaria. There is no significant infectious history. Vaccinations are up to date.    Past Medical History: Patient  Active Problem List   Diagnosis Date Noted  . Perennial allergic rhinitis 08/09/2017  . Anaphylactic shock due to adverse food reaction 08/09/2017  . 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 05/11/2017  . Insomnia 12/01/2016  . Anxiety and depression 11/05/2016  . Menorrhagia with regular cycle 11/05/2016  . Dysmenorrhea, unspecified 11/05/2016  . Gastroesophageal reflux disease 11/05/2016  . Irritable bowel syndrome with both constipation and diarrhea 11/05/2016  . Fatigue 11/10/2015  . Myalgia 11/10/2015  . Fibromyalgia 11/10/2015  . Cervical lymphadenitis 11/10/2015  . Smoker 11/10/2015  . Chronic fatigue 11/10/2015  . Menorrhagia with irregular cycle 10/17/2014  . Depression 08/27/2014    Medication List:  Allergies as of 08/09/2017      Reactions   Ivp Dye [iodinated Diagnostic Agents] Shortness Of Breath, Rash   Sulfa Antibiotics Nausea And Vomiting   Tuna [fish Allergy] Shortness Of Breath, Swelling   Cherry Hives   Codeine Other (See Comments)   "I'm Delusional"   Dill Oil Hives    Latex Hives   Morphine And Related Other (See Comments)   "bottoms out my blood pressure"   Topamax [topiramate]    Hot flashes, leg pain   Vicodin [hydrocodone-acetaminophen] Other (See Comments)   Hallucinations: "I see spiders"      Medication List        Accurate as of 08/09/17 10:04 AM. Always use your most recent med list.          ALLEGRA ALLERGY 180 MG tablet Generic drug:  fexofenadine Take 180 mg by mouth daily.   aspirin-acetaminophen-caffeine 250-250-65 MG tablet Commonly known as:  EXCEDRIN MIGRAINE Take 2 tablets by mouth every 6 (six) hours as needed for headache.   buPROPion 150 MG 24 hr tablet Commonly known as:  WELLBUTRIN XL Take 1 tablet (150 mg total) by mouth daily.   citalopram 40 MG tablet Commonly known as:  CELEXA TAKE 1 TABLET BY MOUTH ONCE DAILY   clonazePAM 0.5 MG tablet Commonly known as:  KLONOPIN Take 1 tablet (0.5 mg total) by mouth 2 (two) times daily as needed for anxiety.   doxycycline 100 MG tablet Commonly known as:  VIBRA-TABS Take 1 tablet (100 mg total) by mouth 2 (two) times daily.   EPINEPHrine 0.3 mg/0.3 mL Soaj injection Commonly known as:  EPI-PEN INJECT 0 3 MLS  0 3 MG TOTAL  INTO THE MUSCLE ONCE FOR 1 DOSE   multivitamin tablet Take 1 tablet by mouth daily.   ondansetron 8 MG disintegrating tablet Commonly known as:  ZOFRAN ODT Take 1 tablet (8 mg total) by mouth every 8 (eight) hours as needed for nausea or vomiting.   pantoprazole 40 MG tablet Commonly known as:  PROTONIX Take 1 tablet (40 mg total) by mouth daily.   PROBIOTIC DAILY PO Take by mouth.   traZODone 50 MG tablet Commonly known as:  DESYREL Take 0.5-1 tablets (25-50 mg total) at bedtime as needed by mouth for sleep.   VITAMIN D PO Take by mouth.       Birth History: non-contributory.   Developmental History: non-contributory.   Past Surgical History: Past Surgical History:  Procedure Laterality Date  . APPENDECTOMY    . OVARIAN  CYST SURGERY    . TUBAL LIGATION       Family History: Family History  Problem Relation Age of Onset  . Asthma Sister   . Asthma Brother   . Allergic rhinitis Neg Hx   . Eczema Neg Hx   . Immunodeficiency Neg Hx   .  Urticaria Neg Hx   . Angioedema Neg Hx      Social History: Alisi lives at home with her husband and daughter who is 12 years old.  She lives in a house that is 37 years old.  There are hardwoods in the main living areas and carpeting in the bedrooms.  She has gas heating and window units for cooling.  There are two birds and three dogs inside the home and a rabbit outside of the home.  She does have dust mite covers on her bedding.  There is tobacco exposure.  She currently works as a Midwife.  She smokes half a pack per day for the past 20 years.    Review of Systems: a 14-point review of systems is pertinent for what is mentioned in HPI.  Otherwise, all other systems were negative. Constitutional: negative other than that listed in the HPI Eyes: negative other than that listed in the HPI Ears, nose, mouth, throat, and face: negative other than that listed in the HPI Respiratory: negative other than that listed in the HPI Cardiovascular: negative other than that listed in the HPI Gastrointestinal: negative other than that listed in the HPI Genitourinary: negative other than that listed in the HPI Integument: negative other than that listed in the HPI Hematologic: negative other than that listed in the HPI Musculoskeletal: negative other than that listed in the HPI Neurological: negative other than that listed in the HPI Allergy/Immunologic: negative other than that listed in the HPI    Objective:   Blood pressure 110/74, pulse 64, temperature 98.3 F (36.8 C), temperature source Oral, resp. rate 16, height 5' 8.5" (1.74 m), weight 172 lb 12.8 oz (78.4 kg), SpO2 98 %. Body mass index is 25.89 kg/m.   Physical Exam:  General:  Alert, interactive, in no acute distress. Pleasant female.  Eyes: No conjunctival injection bilaterally, no discharge on the right, no discharge on the left, no Horner-Trantas dots present and allergic shiners present bilaterally. PERRL bilaterally. EOMI without pain. No photophobia.  Ears: Right TM pearly gray with normal light reflex, Left TM pearly gray with normal light reflex, Right TM intact without perforation and Left TM intact without perforation.  Nose/Throat: External nose within normal limits and septum midline. Turbinates edematous with clear discharge. Posterior oropharynx mildly erythematous without cobblestoning in the posterior oropharynx. Tonsils 2+ without exudates.  Tongue without thrush. Neck: Supple without thyromegaly. Trachea midline. Adenopathy: shoddy bilateral anterior cervical lymphadenopathy, no enlarged lymph nodes appreciated in the occipital, axillary, epitrochlear, inguinal, or popliteal regions. and no enlarged lymph nodes appreciated in the anterior cervical, occipital, axillary, epitrochlear, inguinal, or popliteal regions. Lungs: Clear to auscultation without wheezing, rhonchi or rales. No increased work of breathing. CV: Normal S1/S2. No murmurs. Capillary refill <2 seconds.  Abdomen: Nondistended, nontender. No guarding or rebound tenderness. Bowel sounds present in all fields and hypoactive  Skin: Warm and dry, without lesions or rashes. Extremities:  No clubbing, cyanosis or edema. Neuro:   Grossly intact. No focal deficits appreciated. Responsive to questions.  Diagnostic studies:    Allergy Studies:   Indoor/Outdoor Percutaneous Adult Environmental Panel: positive to Df mite and Dp mites. Otherwise negative with adequate controls.  Selected Foods Panel: negative to tuna with adequate controls   Allergy testing results were read and interpreted by myself, documented by clinical staff.       Salvatore Marvel, MD [facility]

## 2017-08-09 NOTE — Patient Instructions (Addendum)
1. Chronic rhinitis - Testing today showed: dust mites - Avoidance measures provided. - We will get lab work to confirm this and call you in 1-2 weeks with the results.  - Continue with: Allegra (fexofenadine) 180mg  table once daily - Start taking: Nasacort (triamcinolone) two sprays per nostril daily - You can use an extra dose of the antihistamine, if needed, for breakthrough symptoms.  - Consider nasal saline rinses 1-2 times daily to remove allergens from the nasal cavities as well as help with mucous clearance (this is especially helpful to do before the nasal sprays are given) - Consider allergy shots as a means of long-term control. - Allergy shots "re-train" and "reset" the immune system to ignore environmental allergens and decrease the resulting immune response to those allergens (sneezing, itchy watery eyes, runny nose, nasal congestion, etc).    - Allergy shots improve symptoms in 75-85% of patients.  - We can discuss more at the next appointment if the medications are not working for you.  2. 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 cherry, ? tuna) - Testing was negative to tuna today, and since you can eat other fin fish without a problem, I anticipate that you have outgrown your tuna allergy. - Introduce at home with your EpiPen close by. - We will get blood work to look for evidence of a cherry allergy and call you in 1-2 weeks with the results.  - We will plan to retest for alpha gal in 1 year to see where your allergy level is heading. - In the meantime, continue to avoid mammalian meats in all forms. - Anaphylaxis management plan provided.  3. Return in about 3 months (around 11/09/2017).   Please inform us of any Emergency Department visits, hospitalizations, or changes in symptoms. Call us before going to the ED for breathing or allergy symptoms since we might be able to fit you in for a sick visit. Feel free to contact us anytime with any questions, problems,  or concerns.  It was a pleasure to meet you today!  Websites that have reliable patient information: 1. American Academy of Asthma, Allergy, and Immunology: www.aaaai.org 2. Food Allergy Research and Education (FARE): foodallergy.org 3. Mothers of Asthmatics: http://www.asthmacommunitynetwork.org 4. American College of Allergy, Asthma, and Immunology: MonthlyElectricBill.co.uk   Make sure you are registered to vote! If you have moved or changed any of your contact information, you will need to get this updated before voting!       Control of House Dust Mite Allergen    House dust mites play a major role in allergic asthma and rhinitis.  They occur in environments with high humidity wherever human skin, the food for dust mites is found. High levels have been detected in dust obtained from mattresses, pillows, carpets, upholstered furniture, bed covers, clothes and soft toys.  The principal allergen of the house dust mite is found in its feces.  A gram of dust may contain 1,000 mites and 250,000 fecal particles.  Mite antigen is easily measured in the air during house cleaning activities.    1. Encase mattresses, including the box spring, and pillow, in an air tight cover.  Seal the zipper end of the encased mattresses with wide adhesive tape. 2. Wash the bedding in water of 130 degrees Farenheit weekly.  Avoid cotton comforters/quilts and flannel bedding: the most ideal bed covering is the dacron comforter. 3. Remove all upholstered furniture from the bedroom. 4. Remove carpets, carpet padding, rugs, and non-washable window drapes  from the bedroom.  Wash drapes weekly or use plastic window coverings. 5. Remove all non-washable stuffed toys from the bedroom.  Wash stuffed toys weekly. 6. Have the room cleaned frequently with a vacuum cleaner and a damp dust-mop.  The patient should not be in a room which is being cleaned and should wait 1 hour after cleaning before going into the room. 7. Close and  seal all heating outlets in the bedroom.  Otherwise, the room will become filled with dust-laden air.  An electric heater can be used to heat the room. 8. Reduce indoor humidity to less than 50%.  Do not use a humidifier.  Allergy Shots   Allergies are the result of a chain reaction that starts in the immune system. Your immune system controls how your body defends itself. For instance, if you have an allergy to pollen, your immune system identifies pollen as an invader or allergen. Your immune system overreacts by producing antibodies called Immunoglobulin E (IgE). These antibodies travel to cells that release chemicals, causing an allergic reaction.  The concept behind allergy immunotherapy, whether it is received in the form of shots or tablets, is that the immune system can be desensitized to specific allergens that trigger allergy symptoms. Although it requires time and patience, the payback can be long-term relief.  How Do Allergy Shots Work?  Allergy shots work much like a vaccine. Your body responds to injected amounts of a particular allergen given in increasing doses, eventually developing a resistance and tolerance to it. Allergy shots can lead to decreased, minimal or no allergy symptoms.  There generally are two phases: build-up and maintenance. Build-up often ranges from three to six months and involves receiving injections with increasing amounts of the allergens. The shots are typically given once or twice a week, though more rapid build-up schedules are sometimes used.  The maintenance phase begins when the most effective dose is reached. This dose is different for each person, depending on how allergic you are and your response to the build-up injections. Once the maintenance dose is reached, there are longer periods between injections, typically two to four weeks.  Occasionally doctors give cortisone-type shots that can temporarily reduce allergy symptoms. These types of shots are  different and should not be confused with allergy immunotherapy shots.  Who Can Be Treated with Allergy Shots?  Allergy shots may be a good treatment approach for people with allergic rhinitis (hay fever), allergic asthma, conjunctivitis (eye allergy) or stinging insect allergy.   Before deciding to begin allergy shots, you should consider:  . The length of allergy season and the severity of your symptoms . Whether medications and/or changes to your environment can control your symptoms . Your desire to avoid long-term medication use . Time: allergy immunotherapy requires a major time commitment . Cost: may vary depending on your insurance coverage  Allergy shots for children age 63 and older are effective and often well tolerated. They might prevent the onset of new allergen sensitivities or the progression to asthma.  Allergy shots are not started on patients who are pregnant but can be continued on patients who become pregnant while receiving them. In some patients with other medical conditions or who take certain common medications, allergy shots may be of risk. It is important to mention other medications you talk to your allergist.   When Will I Feel Better?  Some may experience decreased allergy symptoms during the build-up phase. For others, it may take as long as 12 months  on the maintenance dose. If there is no improvement after a year of maintenance, your allergist will discuss other treatment options with you.  If you aren't responding to allergy shots, it may be because there is not enough dose of the allergen in your vaccine or there are missing allergens that were not identified during your allergy testing. Other reasons could be that there are high levels of the allergen in your environment or major exposure to non-allergic triggers like tobacco smoke.  What Is the Length of Treatment?  Once the maintenance dose is reached, allergy shots are generally continued for three  to five years. The decision to stop should be discussed with your allergist at that time. Some people may experience a permanent reduction of allergy symptoms. Others may relapse and a longer course of allergy shots can be considered.  What Are the Possible Reactions?  The two types of adverse reactions that can occur with allergy shots are local and systemic. Common local reactions include very mild redness and swelling at the injection site, which can happen immediately or several hours after. A systemic reaction, which is less common, affects the entire body or a particular body system. They are usually mild and typically respond quickly to medications. Signs include increased allergy symptoms such as sneezing, a stuffy nose or hives.  Rarely, a serious systemic reaction called anaphylaxis can develop. Symptoms include swelling in the throat, wheezing, a feeling of tightness in the chest, nausea or dizziness. Most serious systemic reactions develop within 30 minutes of allergy shots. This is why it is strongly recommended you wait in your doctor's office for 30 minutes after your injections. Your allergist is trained to watch for reactions, and his or her staff is trained and equipped with the proper medications to identify and treat them.  Who Should Administer Allergy Shots?  The preferred location for receiving shots is your prescribing allergist's office. Injections can sometimes be given at another facility where the physician and staff are trained to recognize and treat reactions, and have received instructions by your prescribing allergist.

## 2017-08-10 ENCOUNTER — Telehealth: Payer: Self-pay | Admitting: *Deleted

## 2017-08-10 NOTE — Telephone Encounter (Signed)
Received prior auth for Nasacort nasal spray patient needs to purchase otc. Left message for patient to call.

## 2017-08-12 LAB — IGE+ALLERGENS ZONE 2(30)
Alternaria Alternata IgE: <0.1 kU/L
Amer Sycamore IgE Qn: <0.1 kU/L
Aspergillus Fumigatus IgE: <0.1 kU/L
Bermuda Grass IgE: 0.13 kU/L — AB
Cedar, Mountain IgE: 0.1 kU/L — AB
Cladosporium Herbarum IgE: <0.1 kU/L
Common Silver Birch IgE: <0.1 kU/L
D Farinae IgE: <0.1 kU/L
D Pteronyssinus IgE: <0.1 kU/L
Dog Dander IgE: 0.13 kU/L — AB
Hickory, White IgE: <0.1 kU/L
IGE (IMMUNOGLOBULIN E), SERUM: 70 [IU]/mL (ref 6–495)
Maple/Box Elder IgE: 0.1 kU/L — AB
Mucor Racemosus IgE: <0.1 kU/L
Nettle IgE: 0.69 kU/L — AB
Penicillium Chrysogen IgE: <0.1 kU/L
Sheep Sorrel IgE Qn: 0.13 kU/L — AB
Sweet gum IgE RAST Ql: <0.1 kU/L
W009-IGE PLANTAIN, ENGLISH: 0.14 kU/L — AB
White Mulberry IgE: <0.1 kU/L

## 2017-08-12 LAB — ALLERGEN, CHERRY, F242

## 2017-08-15 NOTE — Telephone Encounter (Signed)
Left detailed message advising patient of this information.

## 2017-10-31 MED FILL — CITALOPRAM HBR 40 MG TABLET: 40 | 90 days supply | Qty: 90 | Fill #1

## 2017-10-31 MED FILL — buPROPion HCL ER (XL) 150 M: 150 | 30 days supply | Qty: 30 | Fill #0

## 2017-11-07 ENCOUNTER — Ambulatory Visit (INDEPENDENT_AMBULATORY_CARE_PROVIDER_SITE_OTHER): Payer: 59 | Admitting: Family Medicine

## 2017-11-07 ENCOUNTER — Encounter: Payer: Self-pay | Admitting: Family Medicine

## 2017-11-07 VITALS — BP 108/70 | Ht 68.0 in | Wt 166.6 lb

## 2017-11-07 DIAGNOSIS — Z113 Encounter for screening for infections with a predominantly sexual mode of transmission: Secondary | ICD-10-CM | POA: Diagnosis not present

## 2017-11-07 DIAGNOSIS — Z0000 Encounter for general adult medical examination without abnormal findings: Secondary | ICD-10-CM | POA: Diagnosis not present

## 2017-11-07 DIAGNOSIS — F329 Major depressive disorder, single episode, unspecified: Secondary | ICD-10-CM | POA: Diagnosis not present

## 2017-11-07 DIAGNOSIS — B9689 Other specified bacterial agents as the cause of diseases classified elsewhere: Secondary | ICD-10-CM

## 2017-11-07 DIAGNOSIS — R350 Frequency of micturition: Secondary | ICD-10-CM | POA: Diagnosis not present

## 2017-11-07 DIAGNOSIS — R5383 Other fatigue: Secondary | ICD-10-CM

## 2017-11-07 DIAGNOSIS — N912 Amenorrhea, unspecified: Secondary | ICD-10-CM | POA: Diagnosis not present

## 2017-11-07 DIAGNOSIS — Z23 Encounter for immunization: Secondary | ICD-10-CM

## 2017-11-07 DIAGNOSIS — N6324 Unspecified lump in the left breast, lower inner quadrant: Secondary | ICD-10-CM | POA: Diagnosis not present

## 2017-11-07 DIAGNOSIS — N760 Acute vaginitis: Secondary | ICD-10-CM

## 2017-11-07 DIAGNOSIS — F32A Depression, unspecified: Secondary | ICD-10-CM

## 2017-11-07 DIAGNOSIS — F419 Anxiety disorder, unspecified: Secondary | ICD-10-CM

## 2017-11-07 LAB — POCT URINALYSIS DIPSTICK: pH, UA: 5 (ref 5.0–8.0)

## 2017-11-07 LAB — POCT URINE PREGNANCY: PREG TEST UR: NEGATIVE

## 2017-11-07 MED ORDER — CITALOPRAM HYDROBROMIDE 40 MG PO TABS: ORAL_TABLET | ORAL | 1 refills | Status: DC

## 2017-11-07 MED ORDER — BUPROPION HCL ER (XL) 150 MG PO TB24: 150.0000 mg | ORAL_TABLET | Freq: Every day | ORAL | 4 refills | Status: DC

## 2017-11-07 MED ORDER — METRONIDAZOLE 500 MG PO TABS: 500.0000 mg | ORAL_TABLET | Freq: Two times a day (BID) | ORAL | 0 refills | Status: AC

## 2017-11-07 NOTE — Progress Notes (Signed)
Subjective:    Patient ID: Lisa Fields, female    DOB: 1980/10/19, 37 y.o.   MRN: [id number]  HPI The patient comes in today for a wellness visit.  A review of their health history was completed. A review of medications was also completed.  Any needed refills; celexa and wellbutrin  Eating habits: health conscious  Falls/  MVA accidents in past few months: none  Regular exercise: 3 times a week (bike)  Specialist pt sees on regular basis: Dr. Wynelle Fields - allergy specialist  Preventative health issues were discussed.   Additional concerns: last menstrual cycle was in august. Pt was concerned about pregnancy. Last few weeks having a lot of fatigue and nausea and dizzy spells worse in the morning and evening before going to bed. Reports vomiting about 2 times per week. Took 2 home pregnancies test and it was negative. Denies spotting. Reports some cramping in her lower abdomen and aching to her low back for the last two weeks. Hx of ovarian cysts.  White vaginal discharge since August, denies itching or burning or odor. Sexually active - new partner 6 months ago, no condom use, tubal ligation 12 years ago. Denies concern for STDs.    Frequent urination for 2 weeks. Denies dysuria.   Flu vaccine today.   Depression: Doing well on current dose of celexa and wellbutrin. Reports compliant with medications. Denies adverse effects. Denies SI/HI. Would like to continue.    Results for orders placed or performed in visit on 11/07/17  POCT urine pregnancy  Result Value Ref Range   Preg Test, Ur Negative Negative  POCT urinalysis dipstick  Result Value Ref Range   Color, UA     Clarity, UA     Glucose, UA     Bilirubin, UA     Ketones, UA     Spec Grav, UA <=1.005 (A) 1.010 - 1.025   Blood, UA     pH, UA 5.0 5.0 - 8.0   Protein, UA     Urobilinogen, UA     Nitrite, UA     Leukocytes, UA Moderate (2+) (A) Negative   Appearance     Odor      Review of Systems    Constitutional: Positive for chills and fatigue. Negative for fever and unexpected weight change.  HENT: Negative for congestion, ear pain, sinus pressure, sinus pain and sore throat.   Eyes: Negative for discharge and visual disturbance.  Respiratory: Negative for cough, shortness of breath and wheezing.   Cardiovascular: Negative for chest pain and leg swelling.  Gastrointestinal: Positive for nausea and vomiting. Negative for blood in stool, constipation and diarrhea.  Genitourinary: Positive for frequency, menstrual problem and vaginal discharge. Negative for difficulty urinating, hematuria and vaginal bleeding.  Skin: Negative for color change.  Neurological: Positive for dizziness. Negative for weakness and headaches.  Hematological: Negative for adenopathy.  Psychiatric/Behavioral: Negative for suicidal ideas.  All other systems reviewed and are negative.      Objective:   Physical Exam  Constitutional: She is oriented to person, place, and time. She appears well-developed and well-nourished. No distress.  HENT:  Head: Normocephalic and atraumatic.  Right Ear: Tympanic membrane normal.  Left Ear: Tympanic membrane normal.  Nose: Nose normal.  Mouth/Throat: Uvula is midline and oropharynx is clear and moist.  Eyes: Pupils are equal, round, and reactive to light. Conjunctivae and EOM are normal. Right eye exhibits no discharge. Left eye exhibits no discharge.  Neck: Neck supple. No thyromegaly  present.  Cardiovascular: Normal rate, regular rhythm and normal heart sounds.  No murmur heard. Pulmonary/Chest: Effort normal and breath sounds normal. No respiratory distress. She has no wheezes. Right breast exhibits no inverted nipple, no mass, no nipple discharge, no skin change and no tenderness. Left breast exhibits mass (small discrete mass, approx size of a dime noted to left breast around 7 o'clock position, non-fixed, non-tender). Left breast exhibits no inverted nipple, no nipple  discharge, no skin change and no tenderness.  Abdominal: Soft. Bowel sounds are normal. She exhibits no distension and no mass. There is tenderness (generalized).  Genitourinary: Uterus normal. There is no rash, tenderness, lesion or injury on the right labia. There is no rash, tenderness, lesion or injury on the left labia. Cervix exhibits discharge. Cervix exhibits no motion tenderness. Right adnexum displays no mass and no tenderness. Left adnexum displays no mass and no tenderness. Vaginal discharge found.  Genitourinary Comments: Discharge thin white frothy  Musculoskeletal: She exhibits no edema or deformity.  Lymphadenopathy:    She has no cervical adenopathy.  Neurological: She is alert and oriented to person, place, and time. Coordination normal.  Skin: Skin is warm and dry.  Psychiatric: She has a normal mood and affect. Her behavior is normal. Judgment and thought content normal.  Nursing note and vitals reviewed.   Microscopic exam of urine showed no evidence of WBCs.  Wet prep with clue cells    Assessment & Plan:  1. Routine general medical examination at a health care facility  Adult wellness-complete.wellness physical was conducted today. Importance of diet and exercise were discussed in detail.  In addition to this a discussion regarding safety was also covered. We also reviewed over immunizations and gave recommendations regarding current immunization needed for age.   -Flu vaccine today In addition to this additional areas were also touched on including: Preventative health exams needed:  Colonoscopy N/A Pap Smear due in 2021 with HPV co-testing  Patient was advised yearly wellness exam  2. Breast lump  Lump noted to left breast, at 7 o'clock position. Pt reports this has been present since she stopped breastfeeding her last child about 12-13 years ago. She is unsure if there has been any change.  Reports mammogram had been ordered several years ago to further  evaluate but she was told insurance wouldn't cover it d/t her age.  We will investigate this further.  Pt should have a diagnostic mammogram with possible ultrasound of the left breast to further evaluate mass.  ADDENDUM: After further investigation and speaking with the breast center of [facility], patient should be able to get a diagnostic mammogram at her age. Discussed with Dr. Nicki Reaper and plan to order diagnostic mammogram with possible ultrasound.  It is our opinion that whether this is benign or not the patient should consider removal and would recommend referral to a breast surgeon.  3. Anxiety and depression  Well controlled on current regimen of celexa and wellbutrin. Denies SI/HI. Would like to continue with medication. Refills sent in. Will f/u in 6 months.   4. Amenorrhea - Plan: POCT urine pregnancy  Urine pregnancy test negative. Pt will f/u if she does not have menses in the next two months.  5. Frequent urination - Plan: POCT urinalysis dipstick, Urine Culture, CANCELED: POCT UA - Microscopic Only  Microscopic urine show no evidence of infection.  Will send urine for culture notify patient of results.   6. Fatigue, unspecified type - Plan: CBC with Differential/Platelet, TSH  Will  further evaluate with CBC and TSH, will f/u based on results.   7. Screen for STD (sexually transmitted disease) - Plan: GC/Chlamydia Probe Amp([facility]), will notify patient of results, recommended consistent condom use  8. Bacterial vaginosis  Clue cells evident on wet prep. Offered treatment with metrogel vaginally to help reduce nausea, but patient preferred oral flagyl. Flagyl 500 mg bid x 7 days prescribed. She will f/u if symptoms persist or worsen.   Patient states she did not get mammogram done several years ago because she was under the impression it would not be covered by her insurance She needs to have this mammogram done in more than likely needs to have this removed even if it appears  benign because it will only cause greater concern ongoing As attending physician to this patient visit, this patient was seen in conjunction with the nurse practitioner.  The history,physical and treatment plan was reviewed with the nurse practitioner and pertinent findings were verified with the patient.  Also the treatment plan was reviewed with the patient while they were present. SAL

## 2017-11-09 ENCOUNTER — Other Ambulatory Visit: Payer: Self-pay | Admitting: *Deleted

## 2017-11-09 DIAGNOSIS — N6320 Unspecified lump in the left breast, unspecified quadrant: Secondary | ICD-10-CM

## 2017-11-09 LAB — URINE CULTURE

## 2017-11-09 LAB — GC/CHLAMYDIA PROBE AMP
CHLAMYDIA, DNA PROBE: NEGATIVE
Neisseria gonorrhoeae by PCR: NEGATIVE

## 2017-11-09 NOTE — Progress Notes (Signed)
Discussed with pt. Pt verbalized understanding. Diagnostic mammo scheduled Tuesday oct 22nd at aph 1 pm. Pt notified of appt and referral put in for breast surgeon.

## 2017-11-11 MED ORDER — CEFPROZIL 500 MG PO TABS: 500.0000 mg | ORAL_TABLET | Freq: Two times a day (BID) | ORAL | 0 refills | Status: AC

## 2017-11-15 ENCOUNTER — Ambulatory Visit: Payer: 59 | Admitting: Allergy & Immunology

## 2017-11-15 ENCOUNTER — Ambulatory Visit (HOSPITAL_COMMUNITY)
Admission: RE | Admit: 2017-11-15 | Discharge: 2017-11-15 | Disposition: A | Discharge status: Home / Self Care | Payer: 59 | Source: Ambulatory Visit | Attending: Family Medicine | Admitting: Family Medicine

## 2017-11-15 ENCOUNTER — Ambulatory Visit (HOSPITAL_COMMUNITY): Admission: RE | Admit: 2017-11-15 | Payer: 59 | Source: Ambulatory Visit

## 2017-11-15 DIAGNOSIS — R922 Inconclusive mammogram: Secondary | ICD-10-CM | POA: Diagnosis not present

## 2017-11-15 DIAGNOSIS — N6321 Unspecified lump in the left breast, upper outer quadrant: Secondary | ICD-10-CM | POA: Diagnosis not present

## 2017-11-15 DIAGNOSIS — N6320 Unspecified lump in the left breast, unspecified quadrant: Secondary | ICD-10-CM | POA: Diagnosis not present

## 2017-11-16 ENCOUNTER — Other Ambulatory Visit: Payer: Self-pay | Admitting: Family Medicine

## 2017-11-16 DIAGNOSIS — N6320 Unspecified lump in the left breast, unspecified quadrant: Secondary | ICD-10-CM

## 2017-11-18 IMAGING — DX DG TIBIA/FIBULA 2V*R*
2 series · 2 of 2 positions shown · non-contrast
Comparison: None.

CLINICAL DATA: Injury to mid shaft lower right leg.

EXAM:
RIGHT TIBIA AND FIBULA - 2 VIEW

[tibia ap]
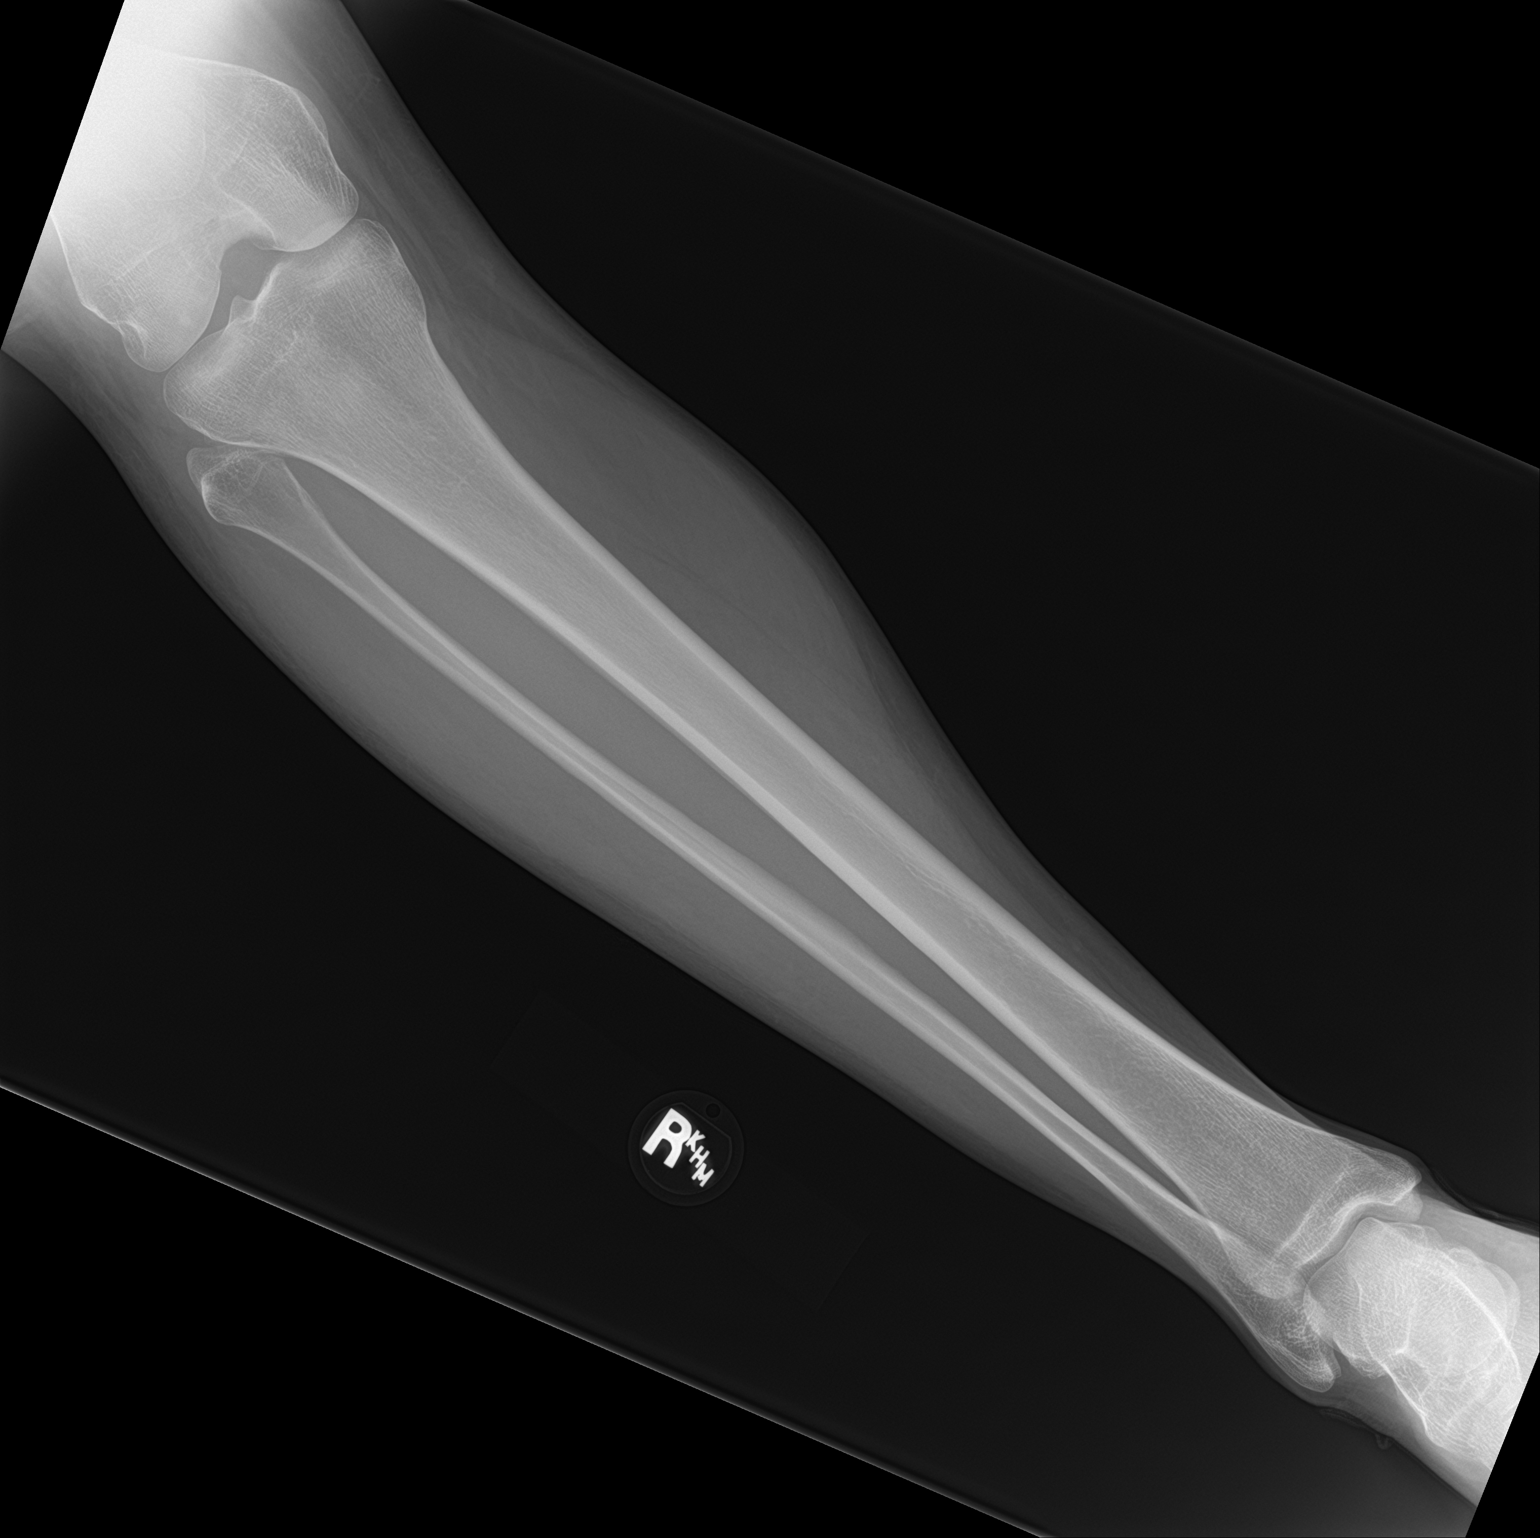

[tibia lat]
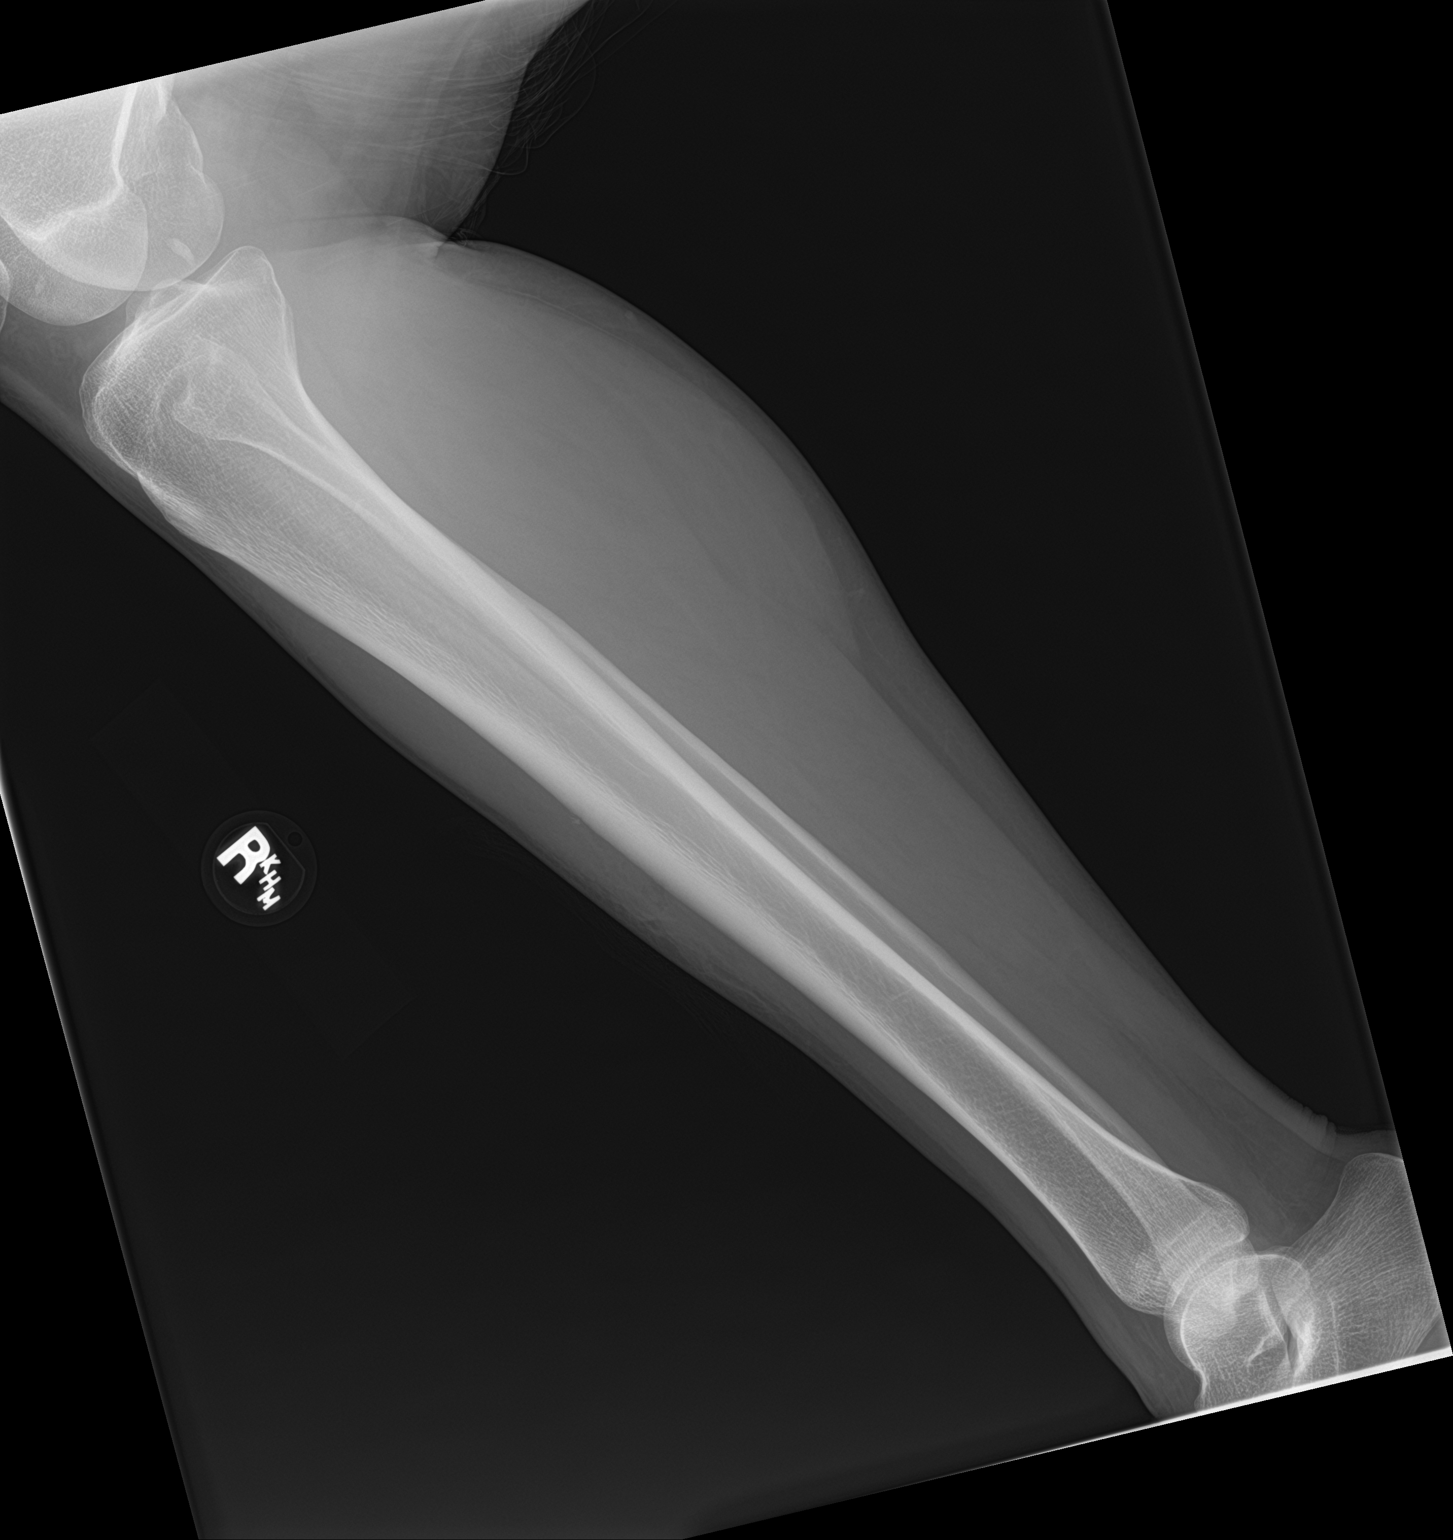

[2 of 2 positions shown; findings below may reference images not displayed]

FINDINGS: There is no evidence of fracture or other focal bone lesions. Soft
tissues are unremarkable.
IMPRESSION: Negative.

## 2017-11-22 ENCOUNTER — Ambulatory Visit (HOSPITAL_COMMUNITY)
Admission: RE | Admit: 2017-11-22 | Discharge: 2017-11-22 | Disposition: A | Discharge status: Home / Self Care | Payer: 59 | Source: Ambulatory Visit | Attending: Family Medicine | Admitting: Family Medicine

## 2017-11-22 ENCOUNTER — Encounter (HOSPITAL_COMMUNITY): Payer: Self-pay

## 2017-11-22 ENCOUNTER — Other Ambulatory Visit: Payer: Self-pay | Admitting: Family Medicine

## 2017-11-22 DIAGNOSIS — N6012 Diffuse cystic mastopathy of left breast: Secondary | ICD-10-CM | POA: Diagnosis not present

## 2017-11-22 DIAGNOSIS — N6320 Unspecified lump in the left breast, unspecified quadrant: Secondary | ICD-10-CM

## 2017-11-22 DIAGNOSIS — N6321 Unspecified lump in the left breast, upper outer quadrant: Secondary | ICD-10-CM | POA: Diagnosis not present

## 2017-11-22 MED ORDER — LIDOCAINE-EPINEPHRINE (PF) 1 %-1:200000 IJ SOLN
INTRAMUSCULAR | Status: AC
  Administered 2017-11-22: 4 mL
  Filled 2017-11-22: qty 30

## 2017-11-22 MED ORDER — LIDOCAINE HCL (PF) 1 % IJ SOLN
INTRAMUSCULAR | Status: AC
  Administered 2017-11-22: 2 mL
  Filled 2017-11-22: qty 5

## 2017-11-24 HISTORY — PX: BREAST BIOPSY: SHX20

## 2017-11-25 ENCOUNTER — Ambulatory Visit: Payer: 59 | Admitting: Allergy & Immunology

## 2018-03-08 ENCOUNTER — Encounter: Payer: Self-pay | Admitting: Family Medicine

## 2018-03-08 ENCOUNTER — Ambulatory Visit (INDEPENDENT_AMBULATORY_CARE_PROVIDER_SITE_OTHER): Payer: No Typology Code available for payment source | Admitting: Family Medicine

## 2018-03-08 VITALS — BP 112/78 | Temp 98.6°F | Ht 68.0 in | Wt 164.0 lb

## 2018-03-08 DIAGNOSIS — J111 Influenza due to unidentified influenza virus with other respiratory manifestations: Secondary | ICD-10-CM

## 2018-03-08 MED ORDER — OSELTAMIVIR PHOSPHATE 75 MG PO CAPS: 75.0000 mg | ORAL_CAPSULE | Freq: Two times a day (BID) | ORAL | 0 refills | Status: AC

## 2018-03-08 NOTE — Progress Notes (Signed)
   Subjective:    Patient ID: Lisa Fields, female    DOB: 29-May-1980, 38 y.o.   MRN: [id number]  HPI Patient is here today with complaints of dry cough, runny nose,headache,fever - 99.8 yesterday, body aches. Exposed to the flu at work. Her symptoms began on 03/07/2018. Reports some nausea, no vomiting or diarrhea.   She has been taking Tylenol.    Review of Systems  Constitutional: Positive for activity change, appetite change, chills and fever.  HENT: Positive for congestion, rhinorrhea and sore throat. Negative for ear pain.   Eyes: Negative for discharge.  Respiratory: Positive for cough. Negative for shortness of breath and wheezing.   Gastrointestinal: Positive for nausea. Negative for diarrhea and vomiting.  Musculoskeletal: Positive for myalgias.  Neurological: Positive for headaches.       Objective:   Physical Exam Vitals signs and nursing note reviewed.  Constitutional:      General: She is not in acute distress.    Appearance: She is not toxic-appearing.  HENT:     Head: Normocephalic and atraumatic.     Right Ear: Tympanic membrane normal.     Left Ear: Tympanic membrane normal.     Nose: Congestion present.     Mouth/Throat:     Mouth: Mucous membranes are moist.     Pharynx: Oropharynx is clear.  Eyes:     General:        Right eye: No discharge.        Left eye: No discharge.  Neck:     Musculoskeletal: Neck supple. No neck rigidity.  Cardiovascular:     Rate and Rhythm: Normal rate and regular rhythm.     Heart sounds: Normal heart sounds.  Pulmonary:     Effort: Pulmonary effort is normal. No respiratory distress.     Breath sounds: Normal breath sounds. No wheezing or rales.  Lymphadenopathy:     Cervical: No cervical adenopathy.  Skin:    General: Skin is warm and dry.  Neurological:     Mental Status: She is alert and oriented to person, place, and time.           Assessment & Plan:  Influenza  Discussed likely flu diagnosis  based on symptoms and recent exposure. Will treat with tamiflu. Discussed symptomatic care. Warning signs discussed. F/u if symptoms worsen or fail to improve.

## 2018-03-08 NOTE — Patient Instructions (Signed)

## 2018-03-12 ENCOUNTER — Encounter (HOSPITAL_COMMUNITY): Payer: Self-pay | Admitting: Emergency Medicine

## 2018-03-12 ENCOUNTER — Emergency Department (HOSPITAL_COMMUNITY)
Admission: EM | Admit: 2018-03-12 | Discharge: 2018-03-12 | Disposition: A | Discharge status: Home / Self Care | Payer: 59 | Attending: Emergency Medicine | Admitting: Emergency Medicine

## 2018-03-12 ENCOUNTER — Other Ambulatory Visit: Payer: Self-pay

## 2018-03-12 DIAGNOSIS — R112 Nausea with vomiting, unspecified: Secondary | ICD-10-CM | POA: Insufficient documentation

## 2018-03-12 DIAGNOSIS — Z5321 Procedure and treatment not carried out due to patient leaving prior to being seen by health care provider: Secondary | ICD-10-CM | POA: Insufficient documentation

## 2018-03-12 DIAGNOSIS — R197 Diarrhea, unspecified: Secondary | ICD-10-CM | POA: Insufficient documentation

## 2018-03-12 DIAGNOSIS — R109 Unspecified abdominal pain: Secondary | ICD-10-CM | POA: Insufficient documentation

## 2018-03-12 MED ORDER — ONDANSETRON 4 MG PO TBDP
ORAL_TABLET | ORAL | Status: AC
  Filled 2018-03-12: qty 1

## 2018-03-12 MED ORDER — SODIUM CHLORIDE 0.9% FLUSH: 3.0000 mL | Freq: Once | INTRAVENOUS | Status: DC

## 2018-03-12 MED ORDER — ONDANSETRON 4 MG PO TBDP
4.0000 mg | ORAL_TABLET | Freq: Once | ORAL | Status: AC | PRN
  Administered 2018-03-12: 4 mg via ORAL
  Filled 2018-03-12: qty 1

## 2018-03-12 NOTE — ED Notes (Signed)
Pt not in waiting room, no answer when name called

## 2018-03-12 NOTE — ED Triage Notes (Signed)
N/V/D  abd pain that radiates to her back, started today .  Pt activley vomiting in triage.

## 2018-08-10 MED FILL — buPROPion HCL ER (XL) 150 M: 150 | 30 days supply | Qty: 30 | Fill #0

## 2018-08-10 MED FILL — CITALOPRAM HBR 40 MG TABLET: 40 | 90 days supply | Qty: 90 | Fill #0

## 2018-09-12 MED FILL — buPROPion HCL ER (XL) 150 M: 150 | 30 days supply | Qty: 30 | Fill #1

## 2018-09-19 ENCOUNTER — Other Ambulatory Visit: Payer: Self-pay

## 2018-09-19 DIAGNOSIS — Z20822 Contact with and (suspected) exposure to covid-19: Secondary | ICD-10-CM

## 2018-10-20 MED FILL — buPROPion HCL ER (XL) 150 M: 150 | 30 days supply | Qty: 30 | Fill #2

## 2018-10-28 MED FILL — NICOTINE 21 MG/24HR PATCH: 21 | 14 days supply | Qty: 14 | Fill #0

## 2018-10-28 MED FILL — SM NICOTINE 4 MG LOZENGE: 4 | 30 days supply | Qty: 72 | Fill #0

## 2018-11-03 ENCOUNTER — Ambulatory Visit (INDEPENDENT_AMBULATORY_CARE_PROVIDER_SITE_OTHER): Payer: No Typology Code available for payment source | Admitting: Nurse Practitioner

## 2018-11-03 DIAGNOSIS — A084 Viral intestinal infection, unspecified: Secondary | ICD-10-CM | POA: Diagnosis not present

## 2018-11-03 MED ORDER — ONDANSETRON 8 MG PO TBDP: 8.0000 mg | ORAL_TABLET | Freq: Three times a day (TID) | ORAL | 0 refills | Status: DC | PRN

## 2018-11-03 NOTE — Progress Notes (Signed)
  PHONE VISIT Subjective:    Patient ID: Lisa Fields, female    DOB: 1980-07-07, 38 y.o.   MRN: [id number]  HPI Pt is having nausea, vomiting, diarrhea, cold chills, weak feeling and low grade temp. Pt states her temp runs between 98.9-99.5. pt states her highest temp has been 100.1. Pt states that her symptoms started Wednesday afternoon. Pt states she went home due to all of a sudden feeling nauseous. Pt was tested on Tuesday for COVID at work place.   Virtual Visit via Telephone Note  I connected with Carmela Hurt on 11/03/18 at  3:20 PM EDT by telephone and verified that I am speaking with the correct person using two identifiers.  Location: Patient: home Provider: office   I discussed the limitations, risks, security and privacy concerns of performing an evaluation and management service by telephone and the availability of in person appointments. I also discussed with the patient that there may be a patient responsible charge related to this service. The patient expressed understanding and agreed to proceed.   History of Present Illness: Presents by phone to discuss nausea and vomiting that began 2 days ago.  Vomiting has improved today after taking Zofran, needs a refill.  Has had diarrhea x3, took a dose of Imodium and this has stopped.  Drinking fluids better today.  Has voided twice, dark urine noted.  Occasional slight cough.  No sore throat or ear pain.  Mouth slightly dry.  Was tested for COVID on 10/6, waiting on results.  Has had slight abdominal pain, this has improved.  Low-grade fever.  No known contacts.  Patient questions whether it was due to some food that she ate on Wednesday.   Observations/Objective: Today's visit was via telephone Physical exam was not possible for this visit Alert, oriented.  Thoughts logical coherent and relevant.  Assessment and Plan: Viral gastroenteritis  Meds ordered this encounter  Medications  . ondansetron (ZOFRAN-ODT) 8  MG disintegrating tablet    Sig: Take 1 tablet (8 mg total) by mouth every 8 (eight) hours as needed for nausea or vomiting.    Dispense:  30 tablet    Refill:  0    Order Specific Question:   Supervising Provider    Answer:   Sallee Lange A [[id number]]     Follow Up Instructions: Increase clear fluid intake until increased voiding and light-colored urine. Continue Zofran as directed.  Expect gradual resolution of symptoms over the next couple of days, call back Monday if no improvement, go to urgent care or ED over the weekend if worse.  Reviewed warning signs including dehydration.   I discussed the assessment and treatment plan with the patient. The patient was provided an opportunity to ask questions and all were answered. The patient agreed with the plan and demonstrated an understanding of the instructions.   The patient was advised to call back or seek an in-person evaluation if the symptoms worsen or if the condition fails to improve as anticipated.  I provided 15 minutes of non-face-to-face time during this encounter.       Review of Systems     Objective:   Physical Exam        Assessment & Plan:

## 2018-11-04 ENCOUNTER — Encounter: Payer: Self-pay | Admitting: Nurse Practitioner

## 2018-11-07 ENCOUNTER — Other Ambulatory Visit: Payer: Self-pay

## 2018-11-07 ENCOUNTER — Encounter: Payer: Self-pay | Admitting: Family Medicine

## 2018-11-07 ENCOUNTER — Ambulatory Visit (INDEPENDENT_AMBULATORY_CARE_PROVIDER_SITE_OTHER): Payer: No Typology Code available for payment source | Admitting: Family Medicine

## 2018-11-07 DIAGNOSIS — Z20828 Contact with and (suspected) exposure to other viral communicable diseases: Secondary | ICD-10-CM

## 2018-11-07 DIAGNOSIS — Z20822 Contact with and (suspected) exposure to covid-19: Secondary | ICD-10-CM

## 2018-11-07 NOTE — Progress Notes (Signed)
   Subjective:  Audio only  Patient ID: Lisa Fields, female    DOB: 20-Feb-1980, 38 y.o.   MRN: [id number]  HPI Pt is still having dry cough, feeling tired, hurting all over. Last Wednesday pt was sent home from work due to vomiting. Pt states no fever, no chills any longer. Pt is tested at work for [employer] on a weekly basis.   Virtual Visit via Telephone Note  I connected with Carmela Hurt on 11/07/18 at 10:30 AM EDT by telephone and verified that I am speaking with the correct person using two identifiers.  Location: Patient: home Provider: office   I discussed the limitations, risks, security and privacy concerns of performing an evaluation and management service by telephone and the availability of in person appointments. I also discussed with the patient that there may be a patient responsible charge related to this service. The patient expressed understanding and agreed to proceed.   History of Present Illness:    Observations/Objective:   Assessment and Plan:   Follow Up Instructions:    I discussed the assessment and treatment plan with the patient. The patient was provided an opportunity to ask questions and all were answered. The patient agreed with the plan and demonstrated an understanding of the instructions.   The patient was advised to call back or seek an in-person evaluation if the symptoms worsen or if the condition fails to improve as anticipated.  I provided 17 minutes of non-face-to-face time during this encounter.   Vicente Males, LPN  Patient still has a persistent cough.  Diminished energy.  Achy all over.  Feels washed out.  No specific shortness of breath.  No fever.  Review of Systems    See above Objective:   Physical Exam  Virtual      Assessment & Plan:  Impression suspected COVID-19.  Patient had a Covid test on Friday which was negative.  This was 2 days after symptoms started.  This was done through the system.  I think she  needs another Covid test discussed.  Work excuse for this week.  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

## 2018-11-08 ENCOUNTER — Encounter: Payer: Self-pay | Admitting: Family Medicine

## 2018-11-09 LAB — NOVEL CORONAVIRUS, NAA: SARS-CoV-2, NAA: NOT DETECTED

## 2018-11-13 ENCOUNTER — Telehealth: Payer: Self-pay | Admitting: Family Medicine

## 2018-11-13 NOTE — Telephone Encounter (Signed)
Patient works as the [employer] center. Has been sick for over a week now.  Has had two virtual visits and a negative covid test.  Patient has sent a mychart msg requesting an appt due to health at work needing her to be checked out. Has cough, body aches, fatigue, nausea and vomitting.  Not sure how to schedule this patient.

## 2018-11-13 NOTE — Telephone Encounter (Signed)
No fever, no sob, cough is better, wants to go back to work tomorrow. States 2 covid test were negative. Last one was last Thursday or Friday pt says. Pt states her job is saying they prefer her to see a doctor first. Works in health care.

## 2018-11-13 NOTE — Telephone Encounter (Signed)
Left message to return call 

## 2018-11-13 NOTE — Telephone Encounter (Signed)
Called pt and schedule appt for tomorrow. She wanted a car visit.

## 2018-11-13 NOTE — Telephone Encounter (Signed)
All I can offer is a car visit for tomorrow Or if patient does not want to do that a virtual visit for tomorrow

## 2018-11-14 ENCOUNTER — Other Ambulatory Visit: Payer: Self-pay

## 2018-11-14 ENCOUNTER — Ambulatory Visit (INDEPENDENT_AMBULATORY_CARE_PROVIDER_SITE_OTHER): Payer: No Typology Code available for payment source | Admitting: Family Medicine

## 2018-11-14 DIAGNOSIS — J4521 Mild intermittent asthma with (acute) exacerbation: Secondary | ICD-10-CM

## 2018-11-14 DIAGNOSIS — J019 Acute sinusitis, unspecified: Secondary | ICD-10-CM

## 2018-11-14 MED ORDER — PREDNISONE 20 MG PO TABS: ORAL_TABLET | ORAL | 0 refills | Status: DC

## 2018-11-14 MED ORDER — AZITHROMYCIN 250 MG PO TABS: ORAL_TABLET | ORAL | 0 refills | Status: DC

## 2018-11-14 MED ORDER — ALBUTEROL SULFATE HFA 108 (90 BASE) MCG/ACT IN AERS: 2.0000 | INHALATION_SPRAY | Freq: Four times a day (QID) | RESPIRATORY_TRACT | 2 refills | Status: DC | PRN

## 2018-11-14 NOTE — Progress Notes (Signed)
   Subjective:    Patient ID: Lisa Fields, female    DOB: [birth year], 38 y.o.   MRN: [id number]  Cough This is a new problem. The cough is productive of sputum. Associated symptoms include rhinorrhea. Pertinent negatives include no chest pain, ear pain, fever, shortness of breath or wheezing. Associated symptoms comments: Weak, temp 99.5, tired, all she wants to do is sleep. Treatments tried: Motrin.   Rapid COVID test at work was negative. Pt tried to go to work today but was sent home because when she put her mask on, she became really hot and induced more coughing.  Virtual Visit via Telephone Note  I connected with Carmela Hurt on 11/14/18 at  4:10 PM EDT by telephone and verified that I am speaking with the correct person using two identifiers.  Location: Patient: home Provider: office   I discussed the limitations, risks, security and privacy concerns of performing an evaluation and management service by telephone and the availability of in person appointments. I also discussed with the patient that there may be a patient responsible charge related to this service. The patient expressed understanding and agreed to proceed.   History of Present Illness:    Observations/Objective:   Assessment and Plan:   Follow Up Instructions:    I discussed the assessment and treatment plan with the patient. The patient was provided an opportunity to ask questions and all were answered. The patient agreed with the plan and demonstrated an understanding of the instructions.   The patient was advised to call back or seek an in-person evaluation if the symptoms worsen or if the condition fails to improve as anticipated.  I provided 15 minutes of non-face-to-face time during this encounter.   Vicente Males, LPN     Review of Systems  Constitutional: Negative for activity change and fever.  HENT: Positive for congestion and rhinorrhea. Negative for ear pain.   Eyes:  Negative for discharge.  Respiratory: Positive for cough. Negative for shortness of breath and wheezing.   Cardiovascular: Negative for chest pain.       Objective:   Physical Exam Vitals signs and nursing note reviewed.  Constitutional:      Appearance: She is well-developed.  HENT:     Head: Normocephalic.     Nose: Nose normal.     Mouth/Throat:     Pharynx: No oropharyngeal exudate.  Neck:     Musculoskeletal: Neck supple.  Cardiovascular:     Rate and Rhythm: Normal rate.     Heart sounds: Normal heart sounds. No murmur.  Pulmonary:     Effort: Pulmonary effort is normal.     Breath sounds: Normal breath sounds. No wheezing.  Lymphadenopathy:     Cervical: No cervical adenopathy.  Skin:    General: Skin is warm and dry.           Assessment & Plan:  Continue bronchial component as well as acute rhinosinusitis Short course of prednisone albuterol on a regular basis plus also Z-Pak if progressive troubles or worse immediately follow-up will have visit with the patient tomorrow to reassess her lungs

## 2018-11-15 ENCOUNTER — Ambulatory Visit (INDEPENDENT_AMBULATORY_CARE_PROVIDER_SITE_OTHER): Payer: No Typology Code available for payment source | Admitting: Family Medicine

## 2018-11-15 ENCOUNTER — Other Ambulatory Visit: Payer: Self-pay

## 2018-11-15 DIAGNOSIS — J4521 Mild intermittent asthma with (acute) exacerbation: Secondary | ICD-10-CM

## 2018-11-15 NOTE — Progress Notes (Signed)
   Subjective:    Patient ID: Lisa Fields, female    DOB: 1980/03/30, 38 y.o.   MRN: [id number]  HPI Pt here today for follow up on cough and fatigue. Pt had phone visit with provider yesterday.  This patient's had cough fatigue started off last week with some diarrhea then progressed into having low energy coughing congestion difficulty breathing.  She works at a [employer] home she has been tested twice for Covid negative both times unable to go back to work because of the cough Being a smoker could well be prolonging all of this PMH benign Review of Systems  Constitutional: Negative for activity change and fever.  HENT: Positive for congestion and rhinorrhea. Negative for ear pain.   Eyes: Negative for discharge.  Respiratory: Positive for cough. Negative for shortness of breath and wheezing.   Cardiovascular: Negative for chest pain.       Objective:   Physical Exam Vitals signs and nursing note reviewed.  Constitutional:      Appearance: She is well-developed.  HENT:     Head: Normocephalic.     Nose: Nose normal.     Mouth/Throat:     Pharynx: No oropharyngeal exudate.  Neck:     Musculoskeletal: Neck supple.  Cardiovascular:     Rate and Rhythm: Normal rate.     Heart sounds: Normal heart sounds. No murmur.  Pulmonary:     Effort: Pulmonary effort is normal.     Breath sounds: Normal breath sounds. No wheezing.  Lymphadenopathy:     Cervical: No cervical adenopathy.  Skin:    General: Skin is warm and dry.    Lungs are clear no crackles are heard no wheezing her O2 saturation 98% temperature normal 98 Patient with frequent coughing throughout the whole visit      Assessment & Plan:  Viral illness Cannot rule out Covid but negative test have been completed Patient should stay home from work through at least Monday Then after that can decide when to go back to work Understandably the workplace would not want her back with the level of coughing she is  doing.

## 2018-11-16 ENCOUNTER — Telehealth: Payer: Self-pay | Admitting: Family Medicine

## 2018-11-16 DIAGNOSIS — Z029 Encounter for administrative examinations, unspecified: Secondary | ICD-10-CM

## 2018-11-22 ENCOUNTER — Telehealth: Payer: Self-pay | Admitting: Family Medicine

## 2018-11-22 MED ORDER — CEPHALEXIN 500 MG PO CAPS: 500.0000 mg | ORAL_CAPSULE | Freq: Four times a day (QID) | ORAL | 0 refills | Status: DC

## 2018-11-22 NOTE — Telephone Encounter (Signed)
Patient has UTI from antibiotic that was given her. She is requesting something for UTI. [facility]

## 2018-11-22 NOTE — Telephone Encounter (Signed)
Patient states that since she started her antibiotic she is burning really bad when she urinates and she has noticed blood and discharge when she wipes. Patient states it is not a yeast infection it is a UTI- she is sure it is an infection- please advise

## 2018-11-22 NOTE — Telephone Encounter (Signed)
Prescription sent electronically to pharmacy. Patient notified. 

## 2018-11-22 NOTE — Telephone Encounter (Signed)
Keflex 500 mg 1 taken 4 times daily over the course of the next 5 days

## 2018-11-23 ENCOUNTER — Telehealth: Payer: Self-pay | Admitting: Family Medicine

## 2018-11-23 NOTE — Telephone Encounter (Signed)
Patient had FMLA faxed over to be completed. I filled in what I could,please review form and complete highlighted section on form.Date and sign form in your folder.

## 2018-12-11 ENCOUNTER — Emergency Department (HOSPITAL_COMMUNITY)
Admission: EM | Admit: 2018-12-11 | Discharge: 2018-12-11 | Discharge status: Absent without leave | Payer: No Typology Code available for payment source

## 2018-12-11 ENCOUNTER — Other Ambulatory Visit: Payer: Self-pay

## 2018-12-15 ENCOUNTER — Other Ambulatory Visit: Payer: Self-pay

## 2018-12-15 ENCOUNTER — Encounter: Payer: Self-pay | Admitting: Nurse Practitioner

## 2018-12-15 ENCOUNTER — Ambulatory Visit (INDEPENDENT_AMBULATORY_CARE_PROVIDER_SITE_OTHER): Payer: No Typology Code available for payment source | Admitting: Nurse Practitioner

## 2018-12-15 VITALS — BP 118/78 | Temp 98.2°F | Wt 170.2 lb

## 2018-12-15 DIAGNOSIS — N921 Excessive and frequent menstruation with irregular cycle: Secondary | ICD-10-CM

## 2018-12-15 DIAGNOSIS — Z113 Encounter for screening for infections with a predominantly sexual mode of transmission: Secondary | ICD-10-CM | POA: Diagnosis not present

## 2018-12-15 DIAGNOSIS — R102 Pelvic and perineal pain: Secondary | ICD-10-CM | POA: Diagnosis not present

## 2018-12-15 LAB — POCT UA - MICROSCOPIC ONLY
Bacteria, U Microscopic: 0
Epithelial cells, urine per micros: 0
Mucus, UA: 0
WBC, Ur, HPF, POC: 0
Yeast, UA: 0

## 2018-12-15 LAB — POCT URINALYSIS DIPSTICK
Spec Grav, UA: >=1.03 — AB (ref 1.010–1.025)
pH, UA: 5 (ref 5.0–8.0)

## 2018-12-15 LAB — POCT URINE PREGNANCY: Preg Test, Ur: NEGATIVE

## 2018-12-15 LAB — POCT HEMOGLOBIN: Hemoglobin: 11.4 g/dL (ref 11–14.6)

## 2018-12-15 MED ORDER — NORETHINDRONE 0.35 MG PO TABS: 1.0000 | ORAL_TABLET | Freq: Every day | ORAL | 2 refills | Status: DC

## 2018-12-15 NOTE — Progress Notes (Signed)
Subjective:    Patient ID: Lisa Fields, female    DOB: [birth year], 38 y.o.   MRN: [id number]  HPI Pt here today for cycle issues. Pt states she has her period since 11/4. Pt states before she started she had some white/bloodish tinged vaginal discharge. Afterwards, pt states she has had large clots for 7 days. She had to wear diapers due to the large amount of bleeding. She had pelvic pain, low back pain and abdominal cramping. Pt still having light, menstrual bleeding for the past couple of days with pelvic pain. Normal bowels and bladder.  Additionally, pt feels very tired and hormones are all over the place. Pt has tried Midol with no relief. She had some relief with heating pads. Pt has a monogamous partner for 2 years. No condom use due to latex allergy. Pt had a tubal ligation. Pt is a current smoker.  Review of Systems General: Denies fever, night sweats, or chills. Positive for fatigue.  Cardiovascular: Denies chest pain or palpitations. Respiratory: Denies shortness of breath or cyanosis. Breast: Positive for breast tenderness. Denies any nipple discharge. GU/GYN: Positive for vaginal bleeding, menorrhagia, and dysmenorrhea. Positive for pelvic pain. Denies dysuria, urgency or frequency.     Objective:   Physical Exam General: Alert and oriented. Well-appearing woman. Good eye contact. Slightly flat affect. Skin: Normal skin color for race. No cyanosis present. Cardiovascular: Regular rate and rhythm. No murmurs present. Respiratory: Normal lung sounds throughout all lung fields. No wheezing or rhonchi present. Abdominal: Epigastric tenderness during palpation with guarding.  Moderate generalized mid lower abdominal tenderness. No rebound or guarding. No obvious masses.  GU/GYN: Defers exam due to bleeding Tenderness in the right mid back area to percussion. Moderate tenderness in the right flank area    Results for orders placed or performed in visit on 12/15/18  POCT  hemoglobin  Result Value Ref Range   Hemoglobin 11.4 11 - 14.6 g/dL  POCT urine pregnancy  Result Value Ref Range   Preg Test, Ur Negative Negative  POCT Urinalysis Dipstick  Result Value Ref Range   Color, UA     Clarity, UA     Glucose, UA     Bilirubin, UA +    Ketones, UA     Spec Grav, UA >=1.030 (A) 1.010 - 1.025   Blood, UA +    pH, UA 5.0 5.0 - 8.0   Protein, UA     Urobilinogen, UA     Nitrite, UA     Leukocytes, UA     Appearance     Odor    POCT UA - Micro Only (AZO)  Result Value Ref Range   WBC, Ur, HPF, POC 0    RBC, urine, microscopic occasional    Bacteria, U Microscopic 0    Mucus, UA 0    Epithelial cells, urine per micros 0    Crystals, Ur, HPF, POC     Casts, Ur, LPF, POC     Yeast, UA 0         Assessment & Plan:   Problem List Items Addressed This Visit      Other   Menorrhagia with irregular cycle - Primary   Relevant Orders   POCT hemoglobin (Completed)   POCT urine pregnancy (Completed)   POCT Urinalysis Dipstick (Completed)   Urine Culture   Chlamydia/Gonococcus/Trichomonas, NAA    Other Visit Diagnoses    Routine screening for STI (sexually transmitted infection)  Relevant Orders   Urine Culture   Chlamydia/Gonococcus/Trichomonas, NAA   Pelvic pain       Relevant Orders   POCT UA - Micro Only (AZO) (Completed)     Meds ordered this encounter  Medications  . norethindrone (ORTHO MICRONOR) 0.35 MG tablet    Sig: Take 1 tablet (0.35 mg total) by mouth daily.    Dispense:  1 Package    Refill:  2    Order Specific Question:   Supervising Provider    Answer:   Sallee Lange A [id number]   -Instruction to take multivitamin with iron to help replenish iron stores due to low hemoglobin. Gave pt several options, including Implanon, IUD, Micronor, and endometrial ablation. Options limited due to current smoking use. Pt would like to try Micronor to help stop bleeding. Instructed pt to let us know if bleeding persists after a couple  weeks on medication. Urine pregnancy, U/A and micro all negative. Urine sent to rule out UTI and STI. Warning signs reviewed. Call next week if no improvement in pain, go to ED over the weekend if worse.   Return if symptoms worsen or fail to improve.

## 2018-12-15 NOTE — Progress Notes (Signed)
   Subjective:    Patient ID: Lisa Fields, female    DOB: 06/25/1980, 38 y.o.   MRN: [id number]  HPI Pt here today for cycle issues. Pt states she has has her period since 11/4. Pt states before she started she had some discharge with white/bloodis tinge. Pt states she has had large clots, will get cold, very tired and hormones are all over the place. Pt has tried Midol with no relief. Pt states she will be dry for a little bit and then everything will come out at once.   Review of Systems     Objective:   Physical Exam        Assessment & Plan:

## 2018-12-15 NOTE — Patient Instructions (Signed)
-  Consider daily multivitamin with iron -Research endometrial ablation (long-term treatment)

## 2018-12-16 ENCOUNTER — Telehealth: Payer: Self-pay | Admitting: Nurse Practitioner

## 2018-12-16 NOTE — Telephone Encounter (Signed)
12/16/18: phone call to patient mobile; left message. Checking to see how she is feeling today. Also want to discuss her GERD symptoms. Awaiting a return call.

## 2018-12-17 LAB — URINE CULTURE

## 2018-12-17 LAB — SPECIMEN STATUS REPORT

## 2018-12-20 LAB — CHLAMYDIA/GONOCOCCUS/TRICHOMONAS, NAA
Chlamydia by NAA: POSITIVE — AB
Gonococcus by NAA: NEGATIVE
Trich vag by NAA: NEGATIVE

## 2018-12-20 LAB — SPECIMEN STATUS REPORT

## 2018-12-23 ENCOUNTER — Other Ambulatory Visit: Payer: Self-pay | Admitting: Nurse Practitioner

## 2018-12-23 MED ORDER — AZITHROMYCIN 250 MG PO TABS: ORAL_TABLET | ORAL | 0 refills | Status: DC

## 2018-12-24 NOTE — Telephone Encounter (Signed)
error 

## 2018-12-26 ENCOUNTER — Telehealth: Payer: Self-pay | Admitting: *Deleted

## 2018-12-26 NOTE — Telephone Encounter (Signed)
Abigail Butts, please see answer to other note. Call in Zithromax 250 mg 4 po x 1 dose. Let the pharmacy know it is EPT (this allows [place] to treat him even though he is not our patient). Thanks!

## 2018-12-26 NOTE — Telephone Encounter (Signed)
I talked to pt earlier today and you said you would send in rx for her partner as well since he did not have a doctor. She called back with his dob Lisa Fields 11/01/1974. [facility] [address]

## 2018-12-26 NOTE — Telephone Encounter (Signed)
Done

## 2018-12-29 ENCOUNTER — Ambulatory Visit: Payer: No Typology Code available for payment source | Admitting: Nurse Practitioner

## 2019-01-31 DIAGNOSIS — Z23 Encounter for immunization: Secondary | ICD-10-CM | POA: Diagnosis not present

## 2019-02-28 DIAGNOSIS — Z23 Encounter for immunization: Secondary | ICD-10-CM | POA: Diagnosis not present

## 2019-05-25 ENCOUNTER — Other Ambulatory Visit: Payer: Self-pay | Admitting: Nurse Practitioner

## 2019-05-25 ENCOUNTER — Ambulatory Visit (INDEPENDENT_AMBULATORY_CARE_PROVIDER_SITE_OTHER): Payer: No Typology Code available for payment source | Admitting: Nurse Practitioner

## 2019-05-25 ENCOUNTER — Other Ambulatory Visit: Payer: Self-pay

## 2019-05-25 VITALS — BP 112/74 | Temp 98.0°F | Wt 169.6 lb

## 2019-05-25 DIAGNOSIS — B9689 Other specified bacterial agents as the cause of diseases classified elsewhere: Secondary | ICD-10-CM | POA: Diagnosis not present

## 2019-05-25 DIAGNOSIS — N760 Acute vaginitis: Secondary | ICD-10-CM | POA: Diagnosis not present

## 2019-05-25 DIAGNOSIS — R102 Pelvic and perineal pain: Secondary | ICD-10-CM

## 2019-05-25 DIAGNOSIS — R350 Frequency of micturition: Secondary | ICD-10-CM

## 2019-05-25 LAB — POCT URINALYSIS DIPSTICK (MANUAL)
Leukocytes, UA: NEGATIVE
Nitrite, UA: NEGATIVE
Poct Blood: NEGATIVE
Spec Grav, UA: 1.01 (ref 1.010–1.025)
pH, UA: 6 (ref 5.0–8.0)

## 2019-05-25 LAB — POCT WET PREP WITH KOH
KOH Prep POC: NEGATIVE
Trichomonas, UA: NEGATIVE
pH, Wet Prep: 5.5

## 2019-05-25 LAB — POCT UA - MICROSCOPIC ONLY

## 2019-05-25 LAB — POCT URINE PREGNANCY: Preg Test, Ur: NEGATIVE

## 2019-05-25 MED ORDER — NORETHINDRONE 0.35 MG PO TABS: 1.0000 | ORAL_TABLET | Freq: Every day | ORAL | 2 refills | Status: DC

## 2019-05-25 MED ORDER — METRONIDAZOLE 500 MG PO TABS: 500.0000 mg | ORAL_TABLET | Freq: Two times a day (BID) | ORAL | 0 refills | Status: DC

## 2019-05-25 NOTE — Progress Notes (Signed)
   Subjective:    Patient ID: Lisa Fields, female    DOB: [birth year], 39 y.o.   MRN: [id number]  Abdominal Pain This is a new problem. The current episode started in the past 7 days. The pain is located in the LLQ. Associated symptoms include frequency. Associated symptoms comments: Foul urine odor, grey discharge .      Review of Systems  Gastrointestinal: Positive for abdominal pain.  Genitourinary: Positive for frequency.        Objective:   Physical Exam        Assessment & Plan:

## 2019-05-25 NOTE — Progress Notes (Signed)
Subjective:    Patient ID: Lisa Fields, female    DOB: 18-Jun-1980, 39 y.o.   MRN: [id number]  HPI Patient presents today for pelvic pain, back pain, and dark colored urine. Patient endorses slight burning with urination. PMH: BTL; surgery for ovarian cyst.    Review of Systems  Constitutional: Negative for activity change, appetite change and fever.  Respiratory: Negative for chest tightness and shortness of breath.   Cardiovascular: Negative for chest pain and palpitations.  Gastrointestinal: Positive for constipation. Negative for blood in stool, diarrhea, nausea and vomiting.  Genitourinary: Positive for dysuria, flank pain, pelvic pain and vaginal discharge. Negative for difficulty urinating, dyspareunia, menstrual problem, vaginal bleeding and vaginal pain.  Had intercourse last night.  Did not start Micronor as previously prescribed. Would like to start medication for perimenopause and cycles.      Objective:   Physical Exam Exam conducted with a chaperone present.  Constitutional:      General: She is awake.     Appearance: Normal appearance.  Cardiovascular:     Rate and Rhythm: Normal rate and regular rhythm.     Heart sounds: Normal heart sounds, S1 normal and S2 normal.  Pulmonary:     Effort: Pulmonary effort is normal.     Breath sounds: Normal breath sounds.  Abdominal:     General: Abdomen is flat. Bowel sounds are normal. There is no distension. There are no signs of injury.     Palpations: Abdomen is soft. There is no fluid wave or mass.     Tenderness: There is abdominal tenderness in the suprapubic area. There is no right CVA tenderness or left CVA tenderness.     Hernia: No hernia is present.  Genitourinary:    General: Normal vulva.     Pubic Area: No rash or pubic lice.      Labia:        Right: No rash, tenderness, lesion or injury.        Left: No rash, tenderness, lesion or injury.      Urethra: No urethral pain, urethral swelling or urethral  lesion.     Vagina: Vaginal discharge present.     Cervix: Discharge present. No cervical motion tenderness, friability, erythema or cervical bleeding.     Comments: Yellow/white tinged thin d/c noted from cervix and in vaginal tract. No cervical motion tenderness. Bimanual exam: mild tenderness midline; no adnexal tenderness or masses.  Skin:    General: Skin is warm and dry.  Neurological:     Mental Status: She is alert.  Psychiatric:        Behavior: Behavior is cooperative.     Results for orders placed or performed in visit on 05/25/19  POCT Urinalysis Dip Manual  Result Value Ref Range   Spec Grav, UA 1.010 1.010 - 1.025   pH, UA 6.0 5.0 - 8.0   Leukocytes, UA Negative Negative   Nitrite, UA Negative Negative   Poct Protein     Poct Glucose     Poct Ketones     Poct Urobilinogen     Poct Bilirubin     Poct Blood Negative Negative, trace  POCT urine pregnancy  Result Value Ref Range   Preg Test, Ur Negative Negative  POCT UA - Microscopic Only  Result Value Ref Range   WBC, Ur, HPF, POC None    RBC, urine, microscopic None    Bacteria, U Microscopic None    Mucus, UA  Epithelial cells, urine per micros None    Crystals, Ur, HPF, POC     Casts, Ur, LPF, POC     Yeast, UA    POCT Wet Prep with KOH  Result Value Ref Range   Trichomonas, UA Negative    Clue Cells Wet Prep HPF POC Rare    Epithelial Wet Prep HPF POC Few Few, Moderate, Many, Too numerous to count   Yeast Wet Prep HPF POC None    Bacteria Wet Prep HPF POC Few Few   RBC Wet Prep HPF POC None    WBC Wet Prep HPF POC Rare    KOH Prep POC Negative Negative   pH, Wet Prep 5.5        Assessment & Plan:   Problem List Items Addressed This Visit    None    Visit Diagnoses    Pelvic pain    -  Primary   Relevant Orders   POCT Urinalysis Dip Manual (Completed)   POCT urine pregnancy (Completed)   C. trachomatis/N. gonorrhoeae RNA   POCT UA - Microscopic Only (Completed)   POCT Wet Prep with  KOH (Completed)   Frequency of urination       Relevant Orders   POCT Urinalysis Dip Manual (Completed)   POCT UA - Microscopic Only (Completed)   Bacterial vaginosis       Relevant Medications   metroNIDAZOLE (FLAGYL) 500 MG tablet   Other Relevant Orders   C. trachomatis/N. gonorrhoeae RNA   POCT Wet Prep with KOH (Completed)      Meds ordered this encounter  Medications  . metroNIDAZOLE (FLAGYL) 500 MG tablet    Sig: Take 1 tablet (500 mg total) by mouth 2 (two) times daily with a meal.    Dispense:  14 tablet    Refill:  0    Order Specific Question:   Supervising Provider    Answer:   [place], [place]  . norethindrone (ORTHO MICRONOR) 0.35 MG tablet    Sig: Take 1 tablet (0.35 mg total) by mouth daily.    Dispense:  1 Package    Refill:  2    Order Specific Question:   Supervising Provider    Answer:   Sallee Lange A [id number]   Patient to take Flagyl as directed for BV. Patient counseled on not consuming alcohol while on this medication. Patient to let the office know if symptoms persist after medication. STD testing pending.  Warning signs reviewed. Call back if symptoms worsen or persist.  Recommend wellness exam.

## 2019-05-26 ENCOUNTER — Encounter: Payer: Self-pay | Admitting: Nurse Practitioner

## 2019-05-28 ENCOUNTER — Other Ambulatory Visit: Payer: Self-pay | Admitting: *Deleted

## 2019-05-29 LAB — SPECIMEN STATUS REPORT

## 2019-05-30 LAB — CHLAMYDIA/GONOCOCCUS/TRICHOMONAS, NAA
Chlamydia by NAA: NEGATIVE
Gonococcus by NAA: NEGATIVE
Trich vag by NAA: NEGATIVE

## 2019-05-30 LAB — SPECIMEN STATUS REPORT

## 2019-08-02 ENCOUNTER — Telehealth: Payer: Self-pay | Admitting: Family Medicine

## 2019-08-02 MED ORDER — NORETHINDRONE 0.35 MG PO TABS: 1.0000 | ORAL_TABLET | Freq: Every day | ORAL | 5 refills | Status: DC

## 2019-08-02 NOTE — Telephone Encounter (Signed)
Refill sent to pharmacy and pt is aware. 

## 2019-08-02 NOTE — Telephone Encounter (Signed)
Pt last seen by Hoyle Sauer 05/25/19 for pelvic pain. Please advise. Thank you

## 2019-08-02 NOTE — Telephone Encounter (Signed)
Pt is requesting refill on birth control  [facility] on [address]

## 2019-08-02 NOTE — Telephone Encounter (Signed)
May refill birth control for 6 months

## 2020-07-18 ENCOUNTER — Ambulatory Visit
Admission: EM | Admit: 2020-07-18 | Discharge: 2020-07-18 | Disposition: A | Discharge status: Home / Self Care | Payer: 59 | Attending: Emergency Medicine | Admitting: Emergency Medicine

## 2020-07-18 ENCOUNTER — Encounter: Payer: Self-pay | Admitting: Emergency Medicine

## 2020-07-18 DIAGNOSIS — R519 Headache, unspecified: Secondary | ICD-10-CM

## 2020-07-18 DIAGNOSIS — H9311 Tinnitus, right ear: Secondary | ICD-10-CM

## 2020-07-18 MED ORDER — ONDANSETRON 4 MG PO TBDP
4.0000 mg | ORAL_TABLET | Freq: Once | ORAL | Status: AC
  Administered 2020-07-18: 4 mg via ORAL

## 2020-07-18 MED ORDER — KETOROLAC TROMETHAMINE 30 MG/ML IJ SOLN
30.0000 mg | Freq: Once | INTRAMUSCULAR | Status: AC
  Administered 2020-07-18: 30 mg via INTRAMUSCULAR

## 2020-07-18 MED ORDER — DEXAMETHASONE SODIUM PHOSPHATE 10 MG/ML IJ SOLN
10.0000 mg | Freq: Once | INTRAMUSCULAR | Status: AC
  Administered 2020-07-18: 10 mg via INTRAMUSCULAR

## 2020-07-18 NOTE — Discharge Instructions (Addendum)
Unable to rule out brain bleed given complaint of worse headache of life in urgent care setting.  Offered patient further evaluation and management in the ED.  Patient declines at this time and would like to try outpatient therapy first.  Aware of the risk associated with this decision including missed diagnosis, organ damage, organ failure, and/or death.  Patient aware and in agreement.     Migraine cocktail given in office Rest and drink plenty of fluids Use OTC medications as needed for symptomatic relief Follow up with PCP if symptoms persists Return or go to the ER if you have any new or worsening symptoms such as fever, chills, nausea, vomiting, chest pain, shortness of breath, cough, vision changes, worsening headache despite treatment, slurred speech, facial asymmetry, weakness in arms or legs, etc..Marland Kitchen

## 2020-07-18 NOTE — ED Provider Notes (Signed)
[facility]   [id number] 07/18/20 Arrival Time: 0929  [id number]  SUBJECTIVE:  CANDACE BEGUE is a 40 y.o. female who complains of ringing in RT ear and migraine x 1 week.  Denies a precipitating event, or recent head trauma.  Does admit to fall in the shower, but denies hitting her head or LOC.  Patient localizes her pain to her RT eye.  Describes the pain as constant.  Patient has tried OTC excedrin without relief. Symptoms are made worse with light.  Reports similar symptoms in the past that improved with medication.  States this headache is worse than past headaches.  Complains of associated light sensitivity, numbness in her hands, and nausea/ vomiting.  Patient denies fever, chills, aura, rhinorrhea, watery eyes, chest pain, SOB, abdominal pain, weakness, numbness or tingling, slurred speech.     ROS: As per HPI.  All other pertinent ROS negative.     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  April 2019   Bronchitis    Cervical lymphadenitis 11/10/2015   Chronic fatigue 11/10/2015   Chronic headaches    Fatigue 11/10/2015   Fibromyalgia 11/10/2015   Myalgia 11/10/2015   Ovarian cyst    Smoker 11/10/2015   Past Surgical History:  Procedure Laterality Date   APPENDECTOMY     OVARIAN CYST SURGERY     TUBAL LIGATION     Allergies  Allergen Reactions   Ivp Dye [Iodinated Diagnostic Agents] Shortness Of Breath and Rash   Sulfa Antibiotics Nausea And Vomiting   Codeine Other (See Comments)    "I'm Delusional"   Latex Hives   Morphine And Related Other (See Comments)    "bottoms out my blood pressure"   Topamax [Topiramate]     Hot flashes, leg pain   Vicodin [Hydrocodone-Acetaminophen] Other (See Comments)    Hallucinations: "I see spiders"   No current facility-administered medications on file prior to encounter.   Current Outpatient Medications on File Prior to  Encounter  Medication Sig Dispense Refill   albuterol (VENTOLIN HFA) 108 (90 Base) MCG/ACT inhaler Inhale 2 puffs into the lungs every 6 (six) hours as needed for wheezing. (Patient not taking: Reported on 05/25/2019) 18 g 2   aspirin-acetaminophen-caffeine (EXCEDRIN MIGRAINE) 250-250-65 MG tablet Take 2 tablets by mouth every 6 (six) hours as needed for headache.     Cholecalciferol (VITAMIN D PO) Take by mouth.     EPINEPHrine 0.3 mg/0.3 mL IJ SOAJ injection INJECT 0 3 MLS  0 3 MG TOTAL  INTO THE MUSCLE ONCE FOR 1 DOSE  0   fexofenadine (ALLEGRA ALLERGY) 180 MG tablet Take 180 mg by mouth daily.     metroNIDAZOLE (FLAGYL) 500 MG tablet Take 1 tablet (500 mg total) by mouth 2 (two) times daily with a meal. 14 tablet 0   Multiple Vitamin (MULTIVITAMIN) tablet Take 1 tablet by mouth daily.     norethindrone (ORTHO MICRONOR) 0.35 MG tablet Take 1 tablet (0.35 mg total) by mouth daily. 30 tablet 5   ondansetron (ZOFRAN-ODT) 8 MG disintegrating tablet Take 1 tablet (8 mg total) by mouth every 8 (eight) hours as needed for nausea or vomiting. (Patient not taking: Reported on 12/15/2018) 30 tablet 0   Probiotic Product (PROBIOTIC DAILY PO) Take by mouth.     Social History   Socioeconomic History   Marital status: Significant Other    Spouse name: Not  on file   Number of children: Not on file   Years of education: Not on file   Highest education level: Not on file  Occupational History   Not on file  Tobacco Use   Smoking status: Every Day    Packs/day: 0.50    Pack years: 0.00    Types: Cigarettes    Start date: 07/23/2013   Smokeless tobacco: Never  Substance and Sexual Activity   Alcohol use: No   Drug use: No   Sexual activity: Yes    Birth control/protection: Surgical  Other Topics Concern   Not on file  Social History Narrative   Not on file   Social Determinants of Health   Financial Resource Strain: Not on file  Food Insecurity: Not on file  Transportation Needs: Not on  file  Physical Activity: Not on file  Stress: Not on file  Social Connections: Not on file  Intimate Partner Violence: Not on file   Family History  Problem Relation Age of Onset   Asthma Sister    Asthma Brother    Allergic rhinitis Neg Hx    Eczema Neg Hx    Immunodeficiency Neg Hx    Urticaria Neg Hx    Angioedema Neg Hx     OBJECTIVE:  Vitals:   07/18/20 1002  BP: 110/73  Pulse: 87  Resp: 18  Temp: 98.7 F (37.1 C)  TempSrc: Oral  SpO2: 98%    General appearance: alert; no distress, appears uncomfortable wearing sunglasses in exam room Eyes: PERRLA; EOMI HENT: normocephalic; atraumatic Neck: FROM Lungs: clear to auscultation bilaterally Heart: regular rate and rhythm.   Extremities: no edema; symmetrical with no gross deformities Skin: warm and dry Neurologic: CN 2-12 grossly intact; normal gait; strength and sensation intact bilaterally about the upper and lower extremities Psychological: alert and cooperative; normal mood and affect   ASSESSMENT & PLAN:  1. Acute nonintractable headache, unspecified headache type   2. Tinnitus of right ear     Meds ordered this encounter  Medications   ketorolac (TORADOL) 30 MG/ML injection 30 mg   dexamethasone (DECADRON) injection 10 mg   ondansetron (ZOFRAN-ODT) disintegrating tablet 4 mg   Unable to rule out brain bleed given complaint of worse headache of life in urgent care setting.  Offered patient further evaluation and management in the ED.  Patient declines at this time and would like to try outpatient therapy first.  Aware of the risk associated with this decision including missed diagnosis, organ damage, organ failure, and/or death.  Patient aware and in agreement.     Migraine cocktail given in office Rest and drink plenty of fluids Use OTC medications as needed for symptomatic relief Follow up with PCP if symptoms persists Return or go to the ER if you have any new or worsening symptoms such as fever,  chills, nausea, vomiting, chest pain, shortness of breath, cough, vision changes, worsening headache despite treatment, slurred speech, facial asymmetry, weakness in arms or legs, etc...  Reviewed expectations re: course of current medical issues. Questions answered. Outlined signs and symptoms indicating need for more acute intervention. Patient verbalized understanding. After Visit Summary given.    Lestine Box, PA-C 07/18/20 1027

## 2020-07-18 NOTE — ED Triage Notes (Signed)
Ringing in RT ear since last week which is making her head hurt.

## 2020-08-14 ENCOUNTER — Encounter: Payer: Self-pay | Admitting: Nurse Practitioner

## 2020-08-14 ENCOUNTER — Ambulatory Visit: Payer: 59 | Admitting: Nurse Practitioner

## 2020-08-14 ENCOUNTER — Other Ambulatory Visit: Payer: Self-pay

## 2020-08-14 VITALS — BP 108/72 | HR 86 | Temp 98.1°F | Ht 68.0 in | Wt 170.0 lb

## 2020-08-14 DIAGNOSIS — M7711 Lateral epicondylitis, right elbow: Secondary | ICD-10-CM

## 2020-08-14 DIAGNOSIS — G5691 Unspecified mononeuropathy of right upper limb: Secondary | ICD-10-CM

## 2020-08-14 DIAGNOSIS — F419 Anxiety disorder, unspecified: Secondary | ICD-10-CM

## 2020-08-14 DIAGNOSIS — T3995XA Adverse effect of unspecified nonopioid analgesic, antipyretic and antirheumatic, initial encounter: Secondary | ICD-10-CM | POA: Diagnosis not present

## 2020-08-14 DIAGNOSIS — F32A Depression, unspecified: Secondary | ICD-10-CM

## 2020-08-14 DIAGNOSIS — G4440 Drug-induced headache, not elsewhere classified, not intractable: Secondary | ICD-10-CM | POA: Diagnosis not present

## 2020-08-14 DIAGNOSIS — G43019 Migraine without aura, intractable, without status migrainosus: Secondary | ICD-10-CM | POA: Diagnosis not present

## 2020-08-14 NOTE — Progress Notes (Addendum)
Subjective:    Patient ID: Lisa Fields, female    DOB: 1980/06/01, 40 y.o.   MRN: [id number]  HPI Presents for several issues. C/o headaches. Describes dull pain in the left temple that will spread over the entire head at times.  Describes as constant, varies in intensity.  Worse with her menstrual cycle.  Nausea, occasional vomiting.  Photosensitivity and phonophobia.  Blurred vision but denies any vision loss.  Taking daily analgesics, alternating ibuprofen with Excedrin.  Takes the edge off the headache.  Some improvement in a dark quiet room.  Had an eye exam last week which she states was normal.  Family history: Maternal grandmother has migraines.  A review of the chart after her visit shows that she was seen in 2017 for acute migraine, CT scan at that time was negative.  There is no record that she saw neurologist as suggested by ED. Also complaints of pain in the right arm area, has been going on at least 6 months to 1 year.  Has been occurring more frequently.  Weakness occurs at least once a week.  This was going on before her headaches began.  Slight neck pain.  Has tried a carpal tunnel brace which made it worse.  Wearing a back brace seems to make it better.  Cannot lay down on her right side, causes numbness in her arm.  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  Has dropped items occasionally.  Has some numbness in the hand but can still move her hand and arm.  Pain goes from the hand up into the lateral deltoid on the right side. Depression screen [facility] 2/9 08/14/2020  Decreased Interest 3  Down, Depressed, Hopeless 2  PHQ - 2 Score 5  Altered sleeping 3  Tired, decreased energy 3  Change in appetite 2  Feeling bad or failure about yourself  2  Trouble concentrating 3  Moving slowly or fidgety/restless 3  Suicidal thoughts 0  PHQ-9 Score 21  Difficult doing work/chores Somewhat difficult       Objective:   Physical Exam NAD.  Alert, oriented.  Symmetrical facial  movement.  Speech clear.  Mildly fatigued in appearance.  Calm affect.  Making good eye contact.  Dressed appropriately.  Thoughts logical coherent and relevant.  Funduscopic limited but normal.  Pupils equal and reactive to light.  EOMs intact without nystagmus.  Neck supple with minimal adenopathy.  Lungs clear.  Heart regular rate rhythm.  Hand strength 5+ BiLAP.  Strong radial pulses.  Reflexes 4+ bilaterally patellar and Achilles.  Gait normal.  Tight tender muscles noted along the right cervical area going into the trapezius and rhomboids.  Normal ROM of the neck without tenderness.  Mild anterior shoulder joint line tenderness to palpation.  Can perform full ROM of the right shoulder with minimal tenderness.  Normal ROM of the right elbow with distinct tenderness noted around the right lateral epicondyle.  Normal ROM of the right wrist without tenderness.  Tinel's negative.  Phalen test produces generalized numbness in the right hand. Today's Vitals   08/14/20 1509  BP: 108/72  Pulse: 86  Temp: 98.1 F (36.7 C)  SpO2: 99%  Weight: 170 lb (77.1 kg)  Height: 5\' 8"  (1.727 m)   Body mass index is 25.85 kg/m. CT scan of the head without contrast 09/04/2015 normal.       Assessment & Plan:   Problem List Items Addressed This Visit       Cardiovascular and  Mediastinum   Intractable migraine without aura and without status migrainosus - Primary   Relevant Medications   ibuprofen (ADVIL) 200 MG tablet   Galcanezumab-gnlm (EMGALITY) 120 MG/ML SOAJ     Nervous and Auditory   Neuropathy of right hand     Musculoskeletal and Integument   Right lateral epicondylitis   Relevant Medications   ibuprofen (ADVIL) 200 MG tablet     Other   Analgesic rebound headache   Relevant Medications   ibuprofen (ADVIL) 200 MG tablet   Galcanezumab-gnlm (EMGALITY) 120 MG/ML SOAJ   Anxiety and depression   Meds ordered this encounter  Medications   Galcanezumab-gnlm (EMGALITY) 120 MG/ML SOAJ     Sig: Inject 120 mg subcu once a month to prevent migraines    Dispense:  1.12 mL    Refill:  3    Please let me know if another product is preferred for migraine prophylaxis. Thanks.    Order Specific Question:   Supervising Provider    Answer:   Sallee Lange A [[id number]]   Hold on Topamax due to previous side effects. Hold on beta-blocker due to low blood pressure. Emgality prescribed.  We will wait to see if insurance will require a PA or if med is covered at all. Discussed rebound analgesic induced headaches.  Recommend patient slowly wean off all OTC analgesics.   Keep headache diary using a free app and bring to next visit.  Discussed diagnosis of right lateral epicondylitis.  Recommend arm brace as directed. Patient has several other issues including lesions on her scalp. Agreed to cover all other issues at her next visit in 2 weeks.  Patient to call back sooner if any problems.  Warning signs were reviewed regarding her headaches.

## 2020-08-16 ENCOUNTER — Encounter: Payer: Self-pay | Admitting: Nurse Practitioner

## 2020-08-16 DIAGNOSIS — G4440 Drug-induced headache, not elsewhere classified, not intractable: Secondary | ICD-10-CM | POA: Insufficient documentation

## 2020-08-16 DIAGNOSIS — G5691 Unspecified mononeuropathy of right upper limb: Secondary | ICD-10-CM | POA: Insufficient documentation

## 2020-08-16 DIAGNOSIS — M7711 Lateral epicondylitis, right elbow: Secondary | ICD-10-CM | POA: Insufficient documentation

## 2020-08-16 DIAGNOSIS — G43019 Migraine without aura, intractable, without status migrainosus: Secondary | ICD-10-CM | POA: Insufficient documentation

## 2020-08-16 MED ORDER — EMGALITY 120 MG/ML ~~LOC~~ SOAJ
120.0000 mg | SUBCUTANEOUS | 3 refills | Status: DC
  Filled 2020-08-16 – 2020-09-04 (×3): qty 1, 30d supply, fill #0

## 2020-08-18 ENCOUNTER — Other Ambulatory Visit (HOSPITAL_COMMUNITY): Payer: Self-pay

## 2020-08-21 ENCOUNTER — Other Ambulatory Visit (HOSPITAL_COMMUNITY): Payer: Self-pay

## 2020-08-26 ENCOUNTER — Other Ambulatory Visit (HOSPITAL_COMMUNITY): Payer: Self-pay

## 2020-08-28 ENCOUNTER — Other Ambulatory Visit (HOSPITAL_COMMUNITY): Payer: Self-pay

## 2020-08-29 ENCOUNTER — Ambulatory Visit: Payer: 59 | Admitting: Nurse Practitioner

## 2020-08-29 ENCOUNTER — Encounter: Payer: Self-pay | Admitting: Nurse Practitioner

## 2020-08-29 ENCOUNTER — Other Ambulatory Visit: Payer: Self-pay

## 2020-08-29 VITALS — BP 101/68 | HR 104 | Temp 97.7°F | Ht 68.0 in | Wt 168.0 lb

## 2020-08-29 DIAGNOSIS — R5382 Chronic fatigue, unspecified: Secondary | ICD-10-CM

## 2020-08-29 DIAGNOSIS — F419 Anxiety disorder, unspecified: Secondary | ICD-10-CM

## 2020-08-29 DIAGNOSIS — G43019 Migraine without aura, intractable, without status migrainosus: Secondary | ICD-10-CM | POA: Diagnosis not present

## 2020-08-29 DIAGNOSIS — F32A Depression, unspecified: Secondary | ICD-10-CM | POA: Diagnosis not present

## 2020-08-29 DIAGNOSIS — G4700 Insomnia, unspecified: Secondary | ICD-10-CM

## 2020-08-29 DIAGNOSIS — L989 Disorder of the skin and subcutaneous tissue, unspecified: Secondary | ICD-10-CM | POA: Diagnosis not present

## 2020-08-29 MED ORDER — TRAZODONE HCL 100 MG PO TABS: 100.0000 mg | ORAL_TABLET | Freq: Every day | ORAL | 0 refills | Status: DC

## 2020-08-29 MED ORDER — SUMATRIPTAN SUCCINATE 50 MG PO TABS: ORAL_TABLET | ORAL | 0 refills | Status: DC

## 2020-08-29 NOTE — Progress Notes (Signed)
Subjective:    Patient ID: Lisa Fields, female    DOB: [birth year], 40 y.o.   MRN: [id number]  Headache  Constant headache - tolerable  Excruciating pain with cycles  -Scalp lesions- referral to derm Presents for recheck see previous note.  Her Emgality preauthorization was signed and faxed over today.  Hopefully insurance will be able to get there is through very soon so patient can start medication.  States she does have a qualified person to give her the injection.  Continues to have migraines off and on particularly during her cycle when they are unbearable.  Did not have a headache for 4 days but having 1 today.  Also seems to be sensitive to weather and barometric pressure changes.  Having off and on sinus pressure at times.  Having significant difficulty sleeping.  No relief with melatonin.  Denies suicidal thoughts or ideation. Also has several lesions on her scalp including some new ones living occurring over time.  Requesting referral to dermatology for evaluation and removal.  Does not bother her unless she is brushing her hair, when she snacks the lesions they are tender. Review of Systems  Neurological:  Positive for headaches.  Depression screen [facility] 2/9 08/14/2020  Decreased Interest 3  Down, Depressed, Hopeless 2  PHQ - 2 Score 5  Altered sleeping 3  Tired, decreased energy 3  Change in appetite 2  Feeling bad or failure about yourself  2  Trouble concentrating 3  Moving slowly or fidgety/restless 3  Suicidal thoughts 0  PHQ-9 Score 21  Difficult doing work/chores Somewhat difficult       Objective:   Physical Exam NAD.  Alert, oriented.  Fatigued in appearance.  Making good eye contact.  Dressed appropriately.  Thoughts logical coherent and relevant.  Speech clear.  Lungs clear.  Heart regular rate rhythm.  Several well-defined raised lesions, warty in appearance noted on various areas of the scalp in different stages of growth.  No erythema.  Today's Vitals    08/29/20 1335  BP: 101/68  Pulse: (!) 104  Temp: 97.7 F (36.5 C)  SpO2: 98%  Weight: 168 lb (76.2 kg)  Height: '5\' 8"'$  (1.727 m)   Body mass index is 25.54 kg/m.        Assessment & Plan:   Problem List Items Addressed This Visit       Cardiovascular and Mediastinum   Intractable migraine without aura and without status migrainosus - Primary   Relevant Medications   SUMAtriptan (IMITREX) 50 MG tablet   traZODone (DESYREL) 100 MG tablet     Other   Anxiety and depression   Relevant Medications   traZODone (DESYREL) 100 MG tablet   Chronic fatigue   Insomnia   Other Visit Diagnoses     Scalp lesion       Relevant Orders   Ambulatory referral to Dermatology      Meds ordered this encounter  Medications   SUMAtriptan (IMITREX) 50 MG tablet    Sig: Take one tab at onset of migraine. May repeat in 2 hours if headache persists or recurs. Max 2 per 24 hrs    Dispense:  10 tablet    Refill:  0    Order Specific Question:   Supervising Provider    Answer:   Sallee Lange A [[id number]]   traZODone (DESYREL) 100 MG tablet    Sig: Take 1 tablet (100 mg total) by mouth at bedtime.    Dispense:  30 tablet  Refill:  0    Order Specific Question:   Supervising Provider    Answer:   Kathyrn Drown [[id number]]   Patient states she is taking Imitrex without difficulty in the past.  Restart Imitrex along with an anti-inflammatory for migraines, cautioned about overuse to avoid recurrence of her analgesic rebound headache.  Hopefully Emgality can be started in the near future. Several factors identified today regarding her migraines including stress/anxiety/depression, weather/barometric pressure changes, lack of sleep and menstrual cycles. Start trazodone as directed for sleep and to help with anxiety and depression. Encourage patient to schedule a preventive health exam to further evaluate her menstrual cycles. Return in about 1 month (around 09/29/2020).  30 minutes was spent with  the patient including previsit chart review, time spent with patient, discussion of health issues, review of data including medical record, and documentation of the visit.

## 2020-08-30 ENCOUNTER — Encounter: Payer: Self-pay | Admitting: Nurse Practitioner

## 2020-09-03 ENCOUNTER — Other Ambulatory Visit (HOSPITAL_COMMUNITY): Payer: Self-pay

## 2020-09-04 ENCOUNTER — Other Ambulatory Visit (HOSPITAL_COMMUNITY): Payer: Self-pay

## 2020-09-09 ENCOUNTER — Other Ambulatory Visit (HOSPITAL_COMMUNITY): Payer: Self-pay

## 2020-09-10 ENCOUNTER — Telehealth: Payer: Self-pay | Admitting: Nurse Practitioner

## 2020-09-10 NOTE — Telephone Encounter (Signed)
Patient had FMLA faxed over to be completed on your desk.

## 2020-09-11 DIAGNOSIS — Z029 Encounter for administrative examinations, unspecified: Secondary | ICD-10-CM

## 2020-09-23 ENCOUNTER — Other Ambulatory Visit: Payer: Self-pay | Admitting: Nurse Practitioner

## 2020-09-23 MED ORDER — TRAZODONE HCL 100 MG PO TABS: 100.0000 mg | ORAL_TABLET | Freq: Every day | ORAL | 0 refills | Status: DC

## 2020-09-24 DIAGNOSIS — D224 Melanocytic nevi of scalp and neck: Secondary | ICD-10-CM | POA: Diagnosis not present

## 2020-09-24 DIAGNOSIS — D485 Neoplasm of uncertain behavior of skin: Secondary | ICD-10-CM | POA: Diagnosis not present

## 2020-09-26 ENCOUNTER — Other Ambulatory Visit: Payer: Self-pay | Admitting: Nurse Practitioner

## 2020-09-26 ENCOUNTER — Encounter: Payer: Self-pay | Admitting: Nurse Practitioner

## 2020-09-26 ENCOUNTER — Other Ambulatory Visit (HOSPITAL_COMMUNITY): Payer: Self-pay

## 2020-09-26 MED ORDER — TRAZODONE HCL 100 MG PO TABS
100.0000 mg | ORAL_TABLET | Freq: Every day | ORAL | 0 refills | Status: DC
  Filled 2020-09-26: qty 90, 90d supply, fill #0

## 2020-09-30 ENCOUNTER — Telehealth: Payer: Self-pay | Admitting: *Deleted

## 2020-09-30 ENCOUNTER — Other Ambulatory Visit: Payer: Self-pay

## 2020-09-30 ENCOUNTER — Telehealth: Payer: Self-pay | Admitting: Family Medicine

## 2020-09-30 ENCOUNTER — Ambulatory Visit (INDEPENDENT_AMBULATORY_CARE_PROVIDER_SITE_OTHER): Payer: 59 | Admitting: Family Medicine

## 2020-09-30 ENCOUNTER — Encounter: Payer: Self-pay | Admitting: Family Medicine

## 2020-09-30 DIAGNOSIS — U071 COVID-19: Secondary | ICD-10-CM

## 2020-09-30 MED ORDER — ALBUTEROL SULFATE HFA 108 (90 BASE) MCG/ACT IN AERS: 2.0000 | INHALATION_SPRAY | RESPIRATORY_TRACT | 2 refills | Status: DC | PRN

## 2020-09-30 MED ORDER — ONDANSETRON 8 MG PO TBDP: ORAL_TABLET | ORAL | 3 refills | Status: DC

## 2020-09-30 MED ORDER — BENZONATATE 100 MG PO CAPS: 100.0000 mg | ORAL_CAPSULE | Freq: Three times a day (TID) | ORAL | 0 refills | Status: DC | PRN

## 2020-09-30 MED ORDER — NIRMATRELVIR/RITONAVIR (PAXLOVID)TABLET: 3.0000 | ORAL_TABLET | Freq: Two times a day (BID) | ORAL | 0 refills | Status: AC

## 2020-09-30 NOTE — Telephone Encounter (Signed)
Ms. nashai, vandenbroek are scheduled for a virtual visit with your provider today.    Just as we do with appointments in the office, we must obtain your consent to participate.  Your consent will be active for this visit and any virtual visit you may have with one of our providers in the next 365 days.    If you have a MyChart account, I can also send a copy of this consent to you electronically.  All virtual visits are billed to your insurance company just like a traditional visit in the office.  As this is a virtual visit, video technology does not allow for your provider to perform a traditional examination.  This may limit your provider's ability to fully assess your condition.  If your provider identifies any concerns that need to be evaluated in person or the need to arrange testing such as labs, EKG, etc, we will make arrangements to do so.    Although advances in technology are sophisticated, we cannot ensure that it will always work on either your end or our end.  If the connection with a video visit is poor, we may have to switch to a telephone visit.  With either a video or telephone visit, we are not always able to ensure that we have a secure connection.   I need to obtain your verbal consent now.   Are you willing to proceed with your visit today?   AVISHI WETZEL has provided verbal consent on 09/30/2020 for a virtual visit (video or telephone).   Vicente Males, LPN [id number]  [id number] PM

## 2020-09-30 NOTE — Telephone Encounter (Signed)
Zofran odt 8 mg one SL tid prn nausea #12, 3 refills

## 2020-09-30 NOTE — Telephone Encounter (Signed)
Patient tested positive for COVID; terrible cough and other symptoms. Patient requesting provider prescribe something to help manage symptoms. Please advise at [phone]. Ok to use [employer] or [facility].

## 2020-09-30 NOTE — Telephone Encounter (Signed)
Ms. kartier, mcquire are scheduled for a virtual visit with your provider today.    Just as we do with appointments in the office, we must obtain your consent to participate.  Your consent will be active for this visit and any virtual visit you may have with one of our providers in the next 365 days.    If you have a MyChart account, I can also send a copy of this consent to you electronically.  All virtual visits are billed to your insurance company just like a traditional visit in the office.  As this is a virtual visit, video technology does not allow for your provider to perform a traditional examination.  This may limit your provider's ability to fully assess your condition.  If your provider identifies any concerns that need to be evaluated in person or the need to arrange testing such as labs, EKG, etc, we will make arrangements to do so.    Although advances in technology are sophisticated, we cannot ensure that it will always work on either your end or our end.  If the connection with a video visit is poor, we may have to switch to a telephone visit.  With either a video or telephone visit, we are not always able to ensure that we have a secure connection.   I need to obtain your verbal consent now.   Are you willing to proceed with your visit today?   INDYIA BORIS has provided verbal consent on 09/30/2020 for a virtual visit (video or telephone).

## 2020-09-30 NOTE — Telephone Encounter (Signed)
When did her symptoms began? As for the vomiting is she able to keep liquids down? Would she be able to utilize Zofran or Phenergan if we sent the medication into the pharmacy? Based on her symptoms that she feels she needs to be seen? (Patient may consider office visit at 33 in the place of her daughter, if her daughter is significantly ill with COVID and needs to be seen at the same time that could be done-but I will not be able to do a anxiety visit for the daughter and a [place] visit for the patient at the same time)

## 2020-09-30 NOTE — Telephone Encounter (Signed)
Pt contacted. Pt has terrible cough. Joint pain, cant keep anything down, 99 temp, feels awful. No trouble breathing or shortness of breath unless having long coughing spell.  Pt daughter has appt today at 4:10. COVID test positive today. Daughter tested positive Sunday.  [facility].  Please advise. Thank you.

## 2020-09-30 NOTE — Telephone Encounter (Signed)
Symptoms began on Saturday with sinus infection. Pt has been able to keep 1/2 glass of water down. Pt would like Zofran that dissolves under tongue. Pt states she will reschedule daughter anxiety appt. Placed pt on schedule to be seen with daughter today.

## 2020-09-30 NOTE — Progress Notes (Signed)
   Subjective:    Patient ID: Lisa Fields, female    DOB: 1980/10/23, 40 y.o.   MRN: [id number]  HPI Pt tested positive for COVID today. Having cough, joint pain, unable to keep anything down, low grade temp. No trouble breathing or shortness of breath. She denies shortness of breath but does relate a lot of headache body aches does not feel does relate some She had not been drinking as much Virtual Visit via Telephone Note  I connected with Lisa Fields on 09/30/20 at  4:30 PM EDT by telephone and verified that I am speaking with the correct person using two identifiers.  Location: Patient: home Provider: office   I discussed the limitations, risks, security and privacy concerns of performing an evaluation and management service by telephone and the availability of in person appointments. I also discussed with the patient that there may be a patient responsible charge related to this service. The patient expressed understanding and agreed to proceed.   History of Present Illness:    Observations/Objective:   Assessment and Plan:   Follow Up Instructions:    I discussed the assessment and treatment plan with the patient. The patient was provided an opportunity to ask questions and all were answered. The patient agreed with the plan and demonstrated an understanding of the instructions.   The patient was advised to call back or seek an in-person evaluation if the symptoms worsen or if the condition fails to improve as anticipated.  I provided 15 minutes of non-face-to-face time during this encounter.       Review of Systems     Objective:   Physical Exam  Today's visit was via telephone Physical exam was not possible for this visit       Assessment & Plan:  COVID infection Increased risk due to smoking and reactive airway Paxlovid prescribed previous kidney functions 2 years ago were normal given that her health overall is good more than likely can  tolerate current dosing of 3 tablets twice daily for 5 days Side effects were discussed Albuterol as needed Tessalon as needed Zofran for nausea Warning signs discussed Stay out of work for the next 7 days Follow-up if worse

## 2020-09-30 NOTE — Telephone Encounter (Signed)
Medication sent to [facility]. Pt has phone visit appt this afternoon and will be informed that med was sent

## 2020-10-01 ENCOUNTER — Telehealth: Payer: Self-pay | Admitting: *Deleted

## 2020-10-01 NOTE — Telephone Encounter (Signed)
Call from Dr Nicki Reaper: check to make sure patient is staying well hydrated- if not decrease paxlovid to one of each tablet twice a day  Patient stated she was able to keep down sips of water and juice- advised patient to decrease medication as advised by Dr Nicki Reaper and push fluids- if patient continues to have problems keeping fluids down advised to go to the ER for evaluation and IV Fluids. Patient verbalized understanding.

## 2020-10-03 ENCOUNTER — Ambulatory Visit (INDEPENDENT_AMBULATORY_CARE_PROVIDER_SITE_OTHER): Payer: 59 | Admitting: Nurse Practitioner

## 2020-10-03 ENCOUNTER — Other Ambulatory Visit: Payer: Self-pay

## 2020-10-03 DIAGNOSIS — G43019 Migraine without aura, intractable, without status migrainosus: Secondary | ICD-10-CM | POA: Diagnosis not present

## 2020-10-03 NOTE — Progress Notes (Signed)
   Subjective:    Patient ID: Lisa Fields, female    DOB: 1980/04/25, 40 y.o.   MRN: [id number]  HPI  Patient calls for a follow up on migraines. Patient states they have been doing good- recovering from Covid and feeling much better.   Virtual Visit via Telephone Note  I connected with Lisa Fields on 10/03/20 at  3:40 PM EDT by telephone and verified that I am speaking with the correct person using two identifiers.  Location: Patient: home Provider: office   I discussed the limitations, risks, security and privacy concerns of performing an evaluation and management service by telephone and the availability of in person appointments. I also discussed with the patient that there may be a patient responsible charge related to this service. The patient expressed understanding and agreed to proceed.   History of Present Illness: Presents for recheck on her migraines.  See previous notes.  Patient had some mild erythema "puffiness and slight itching at the site of injection when she took her Emgality first dose.  This was on 8/27.  Also had mild flulike symptoms for about 3 days.  No further headaches since starting this, not even during her cycle. Note that patient is recuperating from [place], states she is feeling much better. She has had her scalp lesions removed by Dr. Tarri Glenn.   Observations/Objective: Today's visit was via telephone Physical exam was not possible for this visit Alert, oriented.  Calm affect.  Assessment and Plan: Problem List Items Addressed This Visit       Cardiovascular and Mediastinum   Intractable migraine without aura and without status migrainosus - Primary     Follow Up Instructions: Patient wishes to continue Emgality for at least another dose.  Most of the visit was dedicated to talking about who could give her the vaccine.  Our office policy does not allow [place] to give it to her here.  Recommend patient have the vaccine sent to [facility]  outpatient pharmacy at [facility] and see if one of the pharmacy staff can give her the injection.  Patient will look into this.  Also discussed oral daily prophylactic medicines in the same class, she will see if any of these are preferred.  Note that patient does have an EpiPen in case of severe allergic reactions.  Based on the information today, Emgality does seem to be helping her headaches. Return in about 3 months (around 01/02/2021).    I discussed the assessment and treatment plan with the patient. The patient was provided an opportunity to ask questions and all were answered. The patient agreed with the plan and demonstrated an understanding of the instructions.   The patient was advised to call back or seek an in-person evaluation if the symptoms worsen or if the condition fails to improve as anticipated.  I provided 15 minutes of non-face-to-face time during this encounter.   Review of Systems     Objective:   Physical Exam        Assessment & Plan:

## 2020-10-04 ENCOUNTER — Encounter: Payer: Self-pay | Admitting: Nurse Practitioner

## 2020-10-04 ENCOUNTER — Other Ambulatory Visit (HOSPITAL_COMMUNITY): Payer: Self-pay

## 2020-10-08 ENCOUNTER — Other Ambulatory Visit: Payer: Self-pay

## 2020-10-08 ENCOUNTER — Encounter: Payer: Self-pay | Admitting: Emergency Medicine

## 2020-10-08 ENCOUNTER — Ambulatory Visit (INDEPENDENT_AMBULATORY_CARE_PROVIDER_SITE_OTHER): Payer: 59

## 2020-10-08 ENCOUNTER — Ambulatory Visit
Admission: EM | Admit: 2020-10-08 | Discharge: 2020-10-08 | Disposition: A | Discharge status: Home / Self Care | Payer: 59 | Attending: Family Medicine | Admitting: Family Medicine

## 2020-10-08 DIAGNOSIS — U071 COVID-19: Secondary | ICD-10-CM

## 2020-10-08 DIAGNOSIS — R059 Cough, unspecified: Secondary | ICD-10-CM

## 2020-10-08 DIAGNOSIS — H60391 Other infective otitis externa, right ear: Secondary | ICD-10-CM

## 2020-10-08 MED ORDER — PREDNISONE 20 MG PO TABS: 40.0000 mg | ORAL_TABLET | Freq: Every day | ORAL | 0 refills | Status: DC

## 2020-10-08 MED ORDER — NEOMYCIN-POLYMYXIN-HC 3.5-10000-1 OT SUSP: 4.0000 [drp] | Freq: Three times a day (TID) | OTIC | 0 refills | Status: DC

## 2020-10-08 NOTE — ED Triage Notes (Signed)
History of covid on 9/6.  Took antiviral mediation.  Right ear pain since this morning.  Diarrhea started today.  States she is unable to taste anything,

## 2020-10-08 NOTE — ED Provider Notes (Signed)
[name]   [id number] 10/08/20 Arrival Time: [place] PLAN:  1. Cough   2. Infective otitis externa of right ear    Possible new viral illness vs rebound from Paxlovid. Discussed.  I have personally viewed the imaging studies ordered this visit. No acute changes or signs of PNA.   Meds ordered this encounter  Medications   neomycin-polymyxin-hydrocortisone (CORTISPORIN) 3.5-10000-1 OTIC suspension    Sig: Place 4 drops into the right ear 3 (three) times daily.    Dispense:  10 mL    Refill:  0   predniSONE (DELTASONE) 20 MG tablet    Sig: Take 2 tablets (40 mg total) by mouth daily.    Dispense:  10 tablet    Refill:  0     Follow-up Information     Kathyrn Drown, MD.   Specialty: Family Medicine Contact information: [address] [place] [address] [phone]                 Reviewed expectations re: course of current medical issues. Questions answered. Outlined signs and symptoms indicating need for more acute intervention. Understanding verbalized. After Visit Summary given.   SUBJECTIVE: History from: patient. Lisa Fields is a 40 y.o. female who reports COVID + last week; took Paxlovid; was feeling better. No with bad cough and R ear pain since yesterday. Mild diarrhea today. Decreased taste. Denies: fever. Normal PO intake without n/v/d.   OBJECTIVE:  Vitals:   10/08/20 1332  BP: 108/73  Pulse: 94  Resp: 18  Temp: 98.5 F (36.9 C)  TempSrc: Oral  SpO2: 97%    General appearance: alert; no distress Eyes: PERRLA; EOMI; conjunctiva normal HENT: [place]; AT; with nasal congestion; R EAC edematous/inflamed; TM normal bilaterally Neck: supple  Lungs: speaks full sentences without difficulty; unlabored; active cough Extremities: no edema Skin: warm and dry Neurologic: normal gait Psychological: alert and cooperative; normal mood and affect   Imaging: DG Chest 2 View  Result Date:  10/08/2020 CLINICAL DATA:  Cough.  Diagnosed with COVID-19 infection last week. EXAM: CHEST - 2 VIEW COMPARISON:  Radiographs 06/15/2017. FINDINGS: The heart size and mediastinal contours are stable. There is possible mild central airway thickening, but no airspace or ground-glass opacities are identified. There is no pleural effusion or pneumothorax. Mild degenerative changes are present within the thoracic spine. IMPRESSION: No radiographic evidence of pneumonia. Mild central airway thickening suggesting bronchitis. Electronically Signed   By: Lisa Fields M.D.   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  April 2019   Bronchitis    Cervical lymphadenitis 11/10/2015   Chronic fatigue 11/10/2015   Chronic headaches    Fatigue 11/10/2015   Fibromyalgia 11/10/2015   Myalgia 11/10/2015   Ovarian cyst    Smoker 11/10/2015   Social History   Socioeconomic History   Marital status:  Significant Other    Spouse name: Not on file   Number of children: Not on file   Years of education: Not on file   Highest education level: Not on file  Occupational History   Not on file  Tobacco Use   Smoking status: Every Day    Packs/day: 0.50    Types: Cigarettes    Start date: 07/23/2013   Smokeless tobacco: Never  Substance and Sexual Activity   Alcohol use: No   Drug use: No   Sexual activity: Yes    Birth control/protection: Surgical  Other Topics  Concern   Not on file  Social History Narrative   Not on file   Social Determinants of Health   Financial Resource Strain: Not on file  Food Insecurity: Not on file  Transportation Needs: Not on file  Physical Activity: Not on file  Stress: Not on file  Social Connections: Not on file  Intimate Partner Violence: Not on file   Family History  Problem Relation Age of Onset   Asthma Sister    Asthma Brother    Allergic rhinitis Neg Hx    Eczema Neg Hx    Immunodeficiency Neg Hx    Urticaria Neg Hx    Angioedema Neg Hx    Past Surgical History:  Procedure Laterality Date   APPENDECTOMY     OVARIAN CYST SURGERY     TUBAL LIGATION       Vanessa Kick, MD 10/08/20 1559

## 2020-11-10 ENCOUNTER — Other Ambulatory Visit: Payer: Self-pay

## 2020-11-10 ENCOUNTER — Ambulatory Visit (INDEPENDENT_AMBULATORY_CARE_PROVIDER_SITE_OTHER): Payer: 59 | Admitting: Family Medicine

## 2020-11-10 ENCOUNTER — Other Ambulatory Visit (HOSPITAL_COMMUNITY): Payer: Self-pay

## 2020-11-10 VITALS — BP 114/72 | HR 95 | Temp 97.8°F | Ht 68.0 in | Wt 171.0 lb

## 2020-11-10 DIAGNOSIS — G43019 Migraine without aura, intractable, without status migrainosus: Secondary | ICD-10-CM | POA: Diagnosis not present

## 2020-11-10 DIAGNOSIS — K219 Gastro-esophageal reflux disease without esophagitis: Secondary | ICD-10-CM

## 2020-11-10 DIAGNOSIS — Z0000 Encounter for general adult medical examination without abnormal findings: Secondary | ICD-10-CM | POA: Diagnosis not present

## 2020-11-10 DIAGNOSIS — F17200 Nicotine dependence, unspecified, uncomplicated: Secondary | ICD-10-CM

## 2020-11-10 DIAGNOSIS — H938X3 Other specified disorders of ear, bilateral: Secondary | ICD-10-CM | POA: Diagnosis not present

## 2020-11-10 DIAGNOSIS — R2689 Other abnormalities of gait and mobility: Secondary | ICD-10-CM

## 2020-11-10 DIAGNOSIS — U099 Post covid-19 condition, unspecified: Secondary | ICD-10-CM | POA: Diagnosis not present

## 2020-11-10 DIAGNOSIS — R6889 Other general symptoms and signs: Secondary | ICD-10-CM | POA: Diagnosis not present

## 2020-11-10 DIAGNOSIS — R5383 Other fatigue: Secondary | ICD-10-CM

## 2020-11-10 MED ORDER — ALBUTEROL SULFATE HFA 108 (90 BASE) MCG/ACT IN AERS
2.0000 | INHALATION_SPRAY | RESPIRATORY_TRACT | 1 refills | Status: DC | PRN
Start: 2020-09-30 — End: 2021-05-03
  Filled 2020-11-10: qty 8.5, 17d supply, fill #0

## 2020-11-10 MED ORDER — NICOTINE 21 MG/24HR TD PT24
MEDICATED_PATCH | TRANSDERMAL | 2 refills | Status: DC
  Filled 2020-11-10: qty 14, 14d supply, fill #0

## 2020-11-10 MED ORDER — NICOTINE 21 MG/24HR TD PT24
21.0000 mg | MEDICATED_PATCH | Freq: Every day | TRANSDERMAL | 3 refills | Status: DC
  Filled 2020-11-10 (×2): qty 14, 14d supply, fill #0
  Filled 2021-07-04: qty 14, 14d supply, fill #1
  Filled 2021-07-15: qty 14, 14d supply, fill #2

## 2020-11-10 NOTE — Progress Notes (Signed)
   Subjective:    Patient ID: Lisa Fields, female    DOB: May 14, 1980, 40 y.o.   MRN: [id number]  HPI  The patient comes in today for a wellness visit.  Patient is trying to be healthy and trying to stay physically active Still smokes She is trying to quit by using nicotine patches  A review of their health history was completed.  A review of medications was also completed.  Any needed refills; nicotene patches  Eating habits: good  Falls/  MVA accidents in past few months: no  Regular exercise: 2 x week tredmill, bike, pilate  Specialist pt sees on regular basis: dermatology  Preventative health issues were discussed.   Additional concerns: discuss post covid - balance, memory, R ear fullness Referral to gyn   Review of Systems     Objective:   Physical Exam General-in no acute distress Eyes-no discharge Lungs-respiratory rate normal, CTA CV-no murmurs,RRR Extremities skin warm dry no edema Neuro grossly normal Behavior normal, alert  Eardrums are normal      Assessment & Plan:   1. Well adult exam Adult wellness-complete.wellness physical was conducted today. Importance of diet and exercise were discussed in detail.  In addition to this a discussion regarding safety was also covered. We also reviewed over immunizations and gave recommendations regarding current immunization needed for age.  In addition to this additional areas were also touched on including: Preventative health exams needed:  Colonoscopy not indicated currently  Patient was advised yearly wellness exam   2. Post-COVID-19 condition Had COVID over a month ago suffering with difficulty focusing thinking, processing, balance, fatigue, ear fullness  3. Imbalance COVID related.  Balance exercises shown  4. Other fatigue To some degree has underlying fatigue but made worse by COVID.  Continue current measures encourage light exercise several times per week  5. Cold  intolerance Check thyroid function  6. Intractable migraine without aura and without status migrainosus Patient has stopped Emgality she continues Imitrex when necessary has not had any severe headaches recently  7. Smoker Patient is working hard at trying to quit she is utilizing nicotine patches we will go with 21 mg once weekly for the next 4 to 8 weeks and then taper down from there rationale discussed with patient, patient will send [place] my chart messages letting us know how this is going  8. Sensation of fullness in both ears Post COVID, eardrums normal

## 2020-11-10 NOTE — Addendum Note (Signed)
Addended by: Sallee Lange A on: 11/10/2020 12:10 PM   Modules accepted: Orders

## 2020-11-11 ENCOUNTER — Other Ambulatory Visit (HOSPITAL_COMMUNITY): Payer: Self-pay

## 2020-11-11 LAB — CBC WITH DIFFERENTIAL/PLATELET
Basophils Absolute: 0.1 10*3/uL (ref 0.0–0.2)
Basos: 1 %
EOS (ABSOLUTE): 0.2 10*3/uL (ref 0.0–0.4)
Eos: 3 %
Hematocrit: 42 % (ref 34.0–46.6)
Hemoglobin: 14.4 g/dL (ref 11.1–15.9)
Immature Grans (Abs): 0 10*3/uL (ref 0.0–0.1)
Immature Granulocytes: 0 %
Lymphocytes Absolute: 2.1 10*3/uL (ref 0.7–3.1)
Lymphs: 35 %
MCH: 31.7 pg (ref 26.6–33.0)
MCHC: 34.3 g/dL (ref 31.5–35.7)
MCV: 93 fL (ref 79–97)
Monocytes Absolute: 0.6 10*3/uL (ref 0.1–0.9)
Monocytes: 10 %
Neutrophils Absolute: 3.1 10*3/uL (ref 1.4–7.0)
Neutrophils: 51 %
Platelets: 303 10*3/uL (ref 150–450)
RBC: 4.54 x10E6/uL (ref 3.77–5.28)
RDW: 12 % (ref 11.7–15.4)
WBC: 6 10*3/uL (ref 3.4–10.8)

## 2020-11-11 LAB — BASIC METABOLIC PANEL
BUN/Creatinine Ratio: 10 (ref 9–23)
BUN: 8 mg/dL (ref 6–24)
CO2: 21 mmol/L (ref 20–29)
Calcium: 9.9 mg/dL (ref 8.7–10.2)
Chloride: 100 mmol/L (ref 96–106)
Creatinine, Ser: 0.8 mg/dL (ref 0.57–1.00)
Glucose: 87 mg/dL (ref 70–99)
Potassium: 4.4 mmol/L (ref 3.5–5.2)
Sodium: 138 mmol/L (ref 134–144)
eGFR: 95 mL/min/{1.73_m2} (ref >59)

## 2020-11-11 LAB — LIPID PANEL
Chol/HDL Ratio: 3.9 ratio (ref 0.0–4.4)
Cholesterol, Total: 193 mg/dL (ref 100–199)
HDL: 50 mg/dL (ref >39)
LDL Chol Calc (NIH): 127 mg/dL — ABNORMAL HIGH (ref 0–99)
Triglycerides: 90 mg/dL (ref 0–149)
VLDL Cholesterol Cal: 16 mg/dL (ref 5–40)

## 2020-11-11 LAB — TSH: TSH: 3.16 u[IU]/mL (ref 0.450–4.500)

## 2020-12-10 ENCOUNTER — Other Ambulatory Visit: Payer: 59 | Admitting: Advanced Practice Midwife

## 2020-12-23 ENCOUNTER — Other Ambulatory Visit (HOSPITAL_COMMUNITY): Payer: Self-pay

## 2021-01-27 ENCOUNTER — Other Ambulatory Visit: Payer: 59 | Admitting: Adult Health

## 2021-02-10 ENCOUNTER — Other Ambulatory Visit (HOSPITAL_COMMUNITY): Payer: Self-pay

## 2021-02-10 ENCOUNTER — Ambulatory Visit (INDEPENDENT_AMBULATORY_CARE_PROVIDER_SITE_OTHER): Payer: 59 | Admitting: Family Medicine

## 2021-02-10 ENCOUNTER — Other Ambulatory Visit: Payer: Self-pay

## 2021-02-10 VITALS — BP 100/67 | HR 92 | Ht 68.0 in | Wt 170.4 lb

## 2021-02-10 DIAGNOSIS — G4489 Other headache syndrome: Secondary | ICD-10-CM | POA: Diagnosis not present

## 2021-02-10 DIAGNOSIS — M62838 Other muscle spasm: Secondary | ICD-10-CM | POA: Diagnosis not present

## 2021-02-10 DIAGNOSIS — G43809 Other migraine, not intractable, without status migrainosus: Secondary | ICD-10-CM

## 2021-02-10 DIAGNOSIS — R519 Headache, unspecified: Secondary | ICD-10-CM | POA: Diagnosis not present

## 2021-02-10 MED ORDER — TRAZODONE HCL 100 MG PO TABS
100.0000 mg | ORAL_TABLET | Freq: Every day | ORAL | 1 refills | Status: DC
  Filled 2021-02-10: qty 90, 90d supply, fill #0
  Filled 2021-07-04: qty 90, 90d supply, fill #1

## 2021-02-10 MED ORDER — SUMATRIPTAN SUCCINATE 50 MG PO TABS
ORAL_TABLET | ORAL | 2 refills | Status: DC
  Filled 2021-02-10: qty 10, 30d supply, fill #0
  Filled 2021-07-04: qty 10, 30d supply, fill #1
  Filled 2021-11-01: qty 10, 30d supply, fill #2

## 2021-02-10 NOTE — Progress Notes (Signed)
° °  Subjective:    Patient ID: Lisa Fields, female    DOB: 12-05-1980, 41 y.o.   MRN: [id number]  HPI  Patient arrives for a follow up on migraines. Patient states she is doing really well with her migraines and she is sleeping better. Needs refill of Imitrex.  Would like to review labs from recent physical. Patient states her moods overall doing well We did go over labs cholesterol slightly healthy diet recommended Patient encouraged to quit smoking She does have severe headaches that wake her up in the middle of the night She even had one that because pain down the left arm with muscle spasms in the arm causing it to cramp up like a claw and it took several minutes for it to go away Review of Systems     Objective:   Physical Exam  General-in no acute distress Eyes-no discharge Lungs-respiratory rate normal, CTA CV-no murmurs,RRR Extremities skin warm dry no edema Neuro grossly normal Behavior normal, alert       Assessment & Plan:   1. Nocturnal headaches MRI recommended.  Need to rule out the possibility of tumor growth  2. Other migraine without status migrainosus, not intractable Continue Imitrex as needed if they become worse will need a daily medication  3. Muscle spasm This was related to a complex migraine but need to rule out possibility of a growth  4. Other headache syndrome Because of the frequency of severe headaches over the past several months worsening of her headaches plus also nocturnal headaches need to do MRI to rule out tumor obviously if tumor or growth is noted referral to specialist otherwise just treat for migraines  Patient encouraged to quit smoking - MR Brain Wo Contrast

## 2021-03-05 ENCOUNTER — Encounter: Payer: Self-pay | Admitting: Family Medicine

## 2021-03-05 ENCOUNTER — Ambulatory Visit (HOSPITAL_COMMUNITY): Admission: RE | Admit: 2021-03-05 | Payer: 59 | Source: Ambulatory Visit

## 2021-03-14 DIAGNOSIS — H5213 Myopia, bilateral: Secondary | ICD-10-CM | POA: Diagnosis not present

## 2021-04-08 DIAGNOSIS — D225 Melanocytic nevi of trunk: Secondary | ICD-10-CM | POA: Diagnosis not present

## 2021-04-08 DIAGNOSIS — I781 Nevus, non-neoplastic: Secondary | ICD-10-CM | POA: Diagnosis not present

## 2021-04-08 DIAGNOSIS — D2239 Melanocytic nevi of other parts of face: Secondary | ICD-10-CM | POA: Diagnosis not present

## 2021-04-08 DIAGNOSIS — Z1283 Encounter for screening for malignant neoplasm of skin: Secondary | ICD-10-CM | POA: Diagnosis not present

## 2021-04-08 DIAGNOSIS — D485 Neoplasm of uncertain behavior of skin: Secondary | ICD-10-CM | POA: Diagnosis not present

## 2021-05-03 ENCOUNTER — Telehealth: Payer: 59 | Admitting: Family

## 2021-05-03 DIAGNOSIS — U071 COVID-19: Secondary | ICD-10-CM | POA: Diagnosis not present

## 2021-05-03 MED ORDER — PREDNISONE 10 MG (21) PO TBPK: ORAL_TABLET | ORAL | 0 refills | Status: DC

## 2021-05-03 MED ORDER — BENZONATATE 100 MG PO CAPS: 100.0000 mg | ORAL_CAPSULE | Freq: Three times a day (TID) | ORAL | 0 refills | Status: DC | PRN

## 2021-05-03 MED ORDER — ALBUTEROL SULFATE HFA 108 (90 BASE) MCG/ACT IN AERS: 2.0000 | INHALATION_SPRAY | Freq: Four times a day (QID) | RESPIRATORY_TRACT | 0 refills | Status: DC | PRN

## 2021-05-03 NOTE — Progress Notes (Signed)
? ? ?E-Visit  for Positive Covid Test Result ? ?We are sorry you are not feeling well. We are here to help! ? ?You have tested positive for COVID-19, meaning that you were infected with the novel coronavirus and could give the virus to others.  It is vitally important that you stay home so you do not spread it to others.     ? ?Please continue isolation at home, for at least 10 days since the start of your symptoms and until you have had 24 hours with no fever (without taking a fever reducer) and with improving of symptoms.  If you have no symptoms but tested positive (or all symptoms resolve after 5 days and you have no fever) you can leave your house but continue to wear a mask around others for an additional 5 days. If you have a fever,continue to stay home until you have had 24 hours of no fever. Most cases improve 5-10 days from onset but we have seen a small number of patients who have gotten worse after the 10 days.  Please be sure to watch for worsening symptoms and remain taking the proper precautions.  ? ?Go to the nearest hospital ED for assessment if fever/cough/breathlessness are severe or illness seems like a threat to life.   ? ?The following symptoms may appear 2-14 days after exposure: ?Fever ?Cough ?Shortness of breath or difficulty breathing ?Chills ?Repeated shaking with chills ?Muscle pain ?Headache ?Sore throat ?New loss of taste or smell ?Fatigue ?Congestion or runny nose ?Nausea or vomiting ?Diarrhea ? ?You have been enrolled in [facility] for COVID-19. Daily you will receive a questionnaire within the [facility] website. Our COVID-19 response team will be monitoring your responses daily. ? ?You can use medication such as prescription cough medication called Tessalon Perles 100 mg. You may take 1-2 capsules every 8 hours as needed for cough and  prescription inhaler called Albuterol MDI 90 mcg /actuation 2 puffs every 4 hours as needed for shortness of breath, wheezing, cough and I  have sent a prednisone dose pack.  ? ?If your symptoms, continue you need to be seen in person for a chest x-ray.  ? ?You may also take acetaminophen (Tylenol) as needed for fever. ? ?HOME CARE: ?Only take medications as instructed by your medical team. ?Drink plenty of fluids and get plenty of rest. ?A steam or ultrasonic humidifier can help if you have congestion.  ? ?GET HELP RIGHT AWAY IF YOU HAVE EMERGENCY WARNING SIGNS.  ?Call 911 or proceed to your closest emergency facility if: ?You develop worsening high fever. ?Trouble breathing ?Bluish lips or face ?Persistent pain or pressure in the chest ?New confusion ?Inability to wake or stay awake ?You cough up blood. ?Your symptoms become more severe ?Inability to hold down food or fluids ? ?This list is not all possible symptoms. Contact your medical provider for any symptoms that are severe or concerning to you. ? ? ? ?Your e-visit answers were reviewed by a board certified advanced clinical practitioner to complete your personal care plan.  Depending on the condition, your plan could have included both over the counter or prescription medications.  If there is a problem please reply once you have received a response from your provider. ? ?Your safety is important to [place].  If you have drug allergies check your prescription carefully.   ? ?You can use MyChart to ask questions about today's visit, request a non-urgent call back, or ask for a work or  school excuse for 24 hours related to this e-Visit. If it has been greater than 24 hours you will need to follow up with your provider, or enter a new e-Visit to address those concerns. ?You will get an e-mail in the next two days asking about your experience.  I hope that your e-visit has been valuable and will speed your recovery. Thank you for using e-visits. ? ? ?Approximately 5 minutes was spent documenting and reviewing patient's chart.  ? ? ? ?

## 2021-05-07 DIAGNOSIS — D485 Neoplasm of uncertain behavior of skin: Secondary | ICD-10-CM | POA: Diagnosis not present

## 2021-05-07 DIAGNOSIS — L988 Other specified disorders of the skin and subcutaneous tissue: Secondary | ICD-10-CM | POA: Diagnosis not present

## 2021-07-04 ENCOUNTER — Other Ambulatory Visit (HOSPITAL_COMMUNITY): Payer: Self-pay

## 2021-07-06 ENCOUNTER — Other Ambulatory Visit (HOSPITAL_COMMUNITY): Payer: Self-pay

## 2021-07-15 ENCOUNTER — Other Ambulatory Visit (HOSPITAL_COMMUNITY): Payer: Self-pay

## 2021-07-15 ENCOUNTER — Other Ambulatory Visit: Payer: Self-pay | Admitting: Family Medicine

## 2021-07-15 MED ORDER — FEXOFENADINE HCL 180 MG PO TABS
180.0000 mg | ORAL_TABLET | Freq: Every day | ORAL | 3 refills | Status: AC
  Filled 2021-07-15: qty 90, 90d supply, fill #0

## 2021-07-16 ENCOUNTER — Other Ambulatory Visit (HOSPITAL_COMMUNITY): Payer: Self-pay

## 2021-09-25 ENCOUNTER — Ambulatory Visit
Admission: EM | Admit: 2021-09-25 | Discharge: 2021-09-25 | Disposition: A | Discharge status: Home / Self Care | Payer: 59 | Attending: Nurse Practitioner | Admitting: Nurse Practitioner

## 2021-09-25 DIAGNOSIS — H60502 Unspecified acute noninfective otitis externa, left ear: Secondary | ICD-10-CM

## 2021-09-25 DIAGNOSIS — J309 Allergic rhinitis, unspecified: Secondary | ICD-10-CM | POA: Diagnosis not present

## 2021-09-25 MED ORDER — CETIRIZINE-PSEUDOEPHEDRINE ER 5-120 MG PO TB12: 1.0000 | ORAL_TABLET | Freq: Every day | ORAL | 0 refills | Status: AC

## 2021-09-25 MED ORDER — PSEUDOEPH-BROMPHEN-DM 30-2-10 MG/5ML PO SYRP: 5.0000 mL | ORAL_SOLUTION | Freq: Four times a day (QID) | ORAL | 0 refills | Status: DC | PRN

## 2021-09-25 MED ORDER — OFLOXACIN 0.3 % OT SOLN: 5.0000 [drp] | Freq: Two times a day (BID) | OTIC | 0 refills | Status: AC

## 2021-09-25 NOTE — ED Provider Notes (Signed)
[facility]    CSN: [id number] Arrival date & time: 09/25/21  0930      History   Chief Complaint Chief Complaint  Patient presents with   Sore Throat   Otalgia         HPI Lisa Fields is a 41 y.o. female.   The history is provided by the patient.   Patient presents for complaints of sore throat and bilateral ear pain.  Patient states symptoms started approximately 2 weeks ago.  Patient states that she had a sore throat first, then the ear pain started last week.  She states that her throat pain is worse at night, and she has scratchiness during the day.  She states that she is having to sleep elevated at bedtime because she has drainage in her throat.  She denies fever, chills, ear drainage, wheezing, shortness of breath, difficulty breathing, or GI symptoms.  She states that she has a cough when she wears her mask.  She states that she also has a history of migraines when her menstrual cycle starts, which is present at this time.  She has taken over-the-counter medications with minimal relief.  Denies history of seasonal allergies.  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 05/11/2017   Insomnia 12/01/2016   Anxiety and depression 11/05/2016   Menorrhagia with regular cycle 11/05/2016   Dysmenorrhea, unspecified 11/05/2016    Gastroesophageal reflux disease 11/05/2016   Irritable bowel syndrome with both constipation and diarrhea 11/05/2016   Fatigue 11/10/2015   Fibromyalgia 11/10/2015   Smoker 11/10/2015   Chronic fatigue 11/10/2015   Depression 08/27/2014    Past Surgical History:  Procedure Laterality Date   APPENDECTOMY     OVARIAN CYST SURGERY     TUBAL LIGATION      OB History     Gravida      Para      Term      Preterm      AB      Living  2      SAB      IAB      Ectopic      Multiple      Live Births               Home Medications    Prior to Admission medications   Medication Sig Start Date End Date Taking? Authorizing Provider  brompheniramine-pseudoephedrine-DM 30-2-10 MG/5ML syrup Take 5 mLs by mouth 4 (four) times daily as needed. 09/25/21  Yes Serenna Deroy-Warren, Alda Lea, NP  cetirizine-pseudoephedrine (ZYRTEC-D) 5-120 MG tablet Take 1 tablet by mouth daily. 09/25/21  Yes Waynesha Rammel-Warren, Alda Lea, NP  ofloxacin (FLOXIN) 0.3 % OTIC solution Place 5 drops into the left ear 2 (two) times daily for 7 days. 09/25/21 10/02/21 Yes Sparkle Aube-Warren, Alda Lea, NP  albuterol (VENTOLIN HFA) 108 (90 Base) MCG/ACT inhaler Inhale 2 puffs into the lungs every 6 (six) hours as needed for wheezing or shortness of breath. 05/03/21   Sharion Balloon, FNP  aspirin-acetaminophen-caffeine (EXCEDRIN MIGRAINE) [phone] MG tablet Take 2 tablets by mouth every 6 (six) hours as needed for headache.    [provider]  benzonatate (TESSALON PERLES) 100 MG capsule Take 1 capsule (100 mg total) by mouth 3 (three) times daily as needed. 05/03/21   Evelina Dun A, FNP  Cholecalciferol (VITAMIN D PO) Take by mouth.    [provider]  EPINEPHrine 0.3 mg/0.3 mL IJ SOAJ injection INJECT 0 3 MLS  0 3 MG TOTAL  INTO THE MUSCLE ONCE FOR 1 DOSE 05/12/17   [provider]  fexofenadine (ALLEGRA) 180 MG tablet Take 1 tablet (180 mg total) by mouth daily. 07/15/21   Kathyrn Drown, MD   Multiple Vitamin (MULTIVITAMIN) tablet Take 1 tablet by mouth daily.    [provider]  nicotine (NICODERM CQ - DOSED IN MG/24 HOURS) 21 mg/24hr patch Place 1 patch (21 mg total) onto the skin daily. 11/10/20   Kathyrn Drown, MD  ondansetron (ZOFRAN-ODT) 8 MG disintegrating tablet Take 1 tablet (8 mg total) by mouth every 8 (eight) hours as needed for nausea or vomiting. 11/03/18   Nilda Simmer, NP  predniSONE (STERAPRED UNI-PAK 21 TAB) 10 MG (21) TBPK tablet Use as directed 05/03/21   Evelina Dun A, FNP  Probiotic Product (PROBIOTIC DAILY PO) Take by mouth.    [provider]  SUMAtriptan (IMITREX) 50 MG tablet Take 1 tablet at onset of migraine. May repeat in 2 hours if headache persists or recurs. Max 2 per 24 hrs 02/10/21   Kathyrn Drown, MD  traZODone (DESYREL) 100 MG tablet Take 1 tablet  by mouth at bedtime. 02/10/21   Kathyrn Drown, MD    Family History Family History  Problem Relation Age of Onset   Asthma Sister    Asthma Brother    Allergic rhinitis Neg Hx    Eczema Neg Hx    Immunodeficiency Neg Hx    Urticaria Neg Hx    Angioedema Neg Hx     Social History Social History   Tobacco Use   Smoking status: Every Day    Packs/day: 0.50    Types: Cigarettes    Start date: 07/23/2013   Smokeless tobacco: Never  Substance Use Topics   Alcohol use: No   Drug use: No     Allergies   Ivp dye [iodinated contrast media], Sulfa antibiotics, Codeine, Latex, Morphine and related, Topamax [topiramate], and Vicodin [hydrocodone-acetaminophen]   Review of Systems Review of Systems Per HPI  Physical Exam Triage Vital Signs ED Triage Vitals  Enc Vitals Group     BP 09/25/21 0937 117/76     Pulse Rate 09/25/21 0937 89     Resp 09/25/21 0937 16     Temp 09/25/21 0937 98.1 F (36.7 C)     Temp Source 09/25/21 0937 Oral     SpO2 09/25/21 0937 98 %     Weight --      Height --      Head Circumference --      Peak Flow --      Pain Score  09/25/21 0933 7     Pain Loc --      Pain Edu? --      Excl. in GC? --    No  data found.  Updated Vital Signs BP 117/76 (BP Location: Right Arm)   Pulse 89   Temp 98.1 F (36.7 C) (Oral)   Resp 16   LMP 09/25/2021 (Exact Date)   SpO2 98%   Visual Acuity Right Eye Distance:   Left Eye Distance:   Bilateral Distance:    Right Eye Near:   Left Eye Near:    Bilateral Near:     Physical Exam Vitals and nursing note reviewed.  Constitutional:      General: She is not in acute distress.    Appearance: She is well-developed.  HENT:     Head: Normocephalic.     Right Ear: Tympanic membrane and ear canal normal. No tenderness.     Left Ear: Swelling (Of the left ear canal) and tenderness present. A middle ear effusion is present. Tympanic membrane is not erythematous.     Ears:     Comments: Erythema is present in the left ear canal.    Nose: Congestion present.     Mouth/Throat:     Mouth: Mucous membranes are moist.     Pharynx: Posterior oropharyngeal erythema present. No pharyngeal swelling.     Tonsils: No tonsillar exudate. 1+ on the right. 1+ on the left.     Comments: + PND Eyes:     Conjunctiva/sclera: Conjunctivae normal.     Pupils: Pupils are equal, round, and reactive to light.  Cardiovascular:     Rate and Rhythm: Normal rate and regular rhythm.     Heart sounds: Normal heart sounds.  Pulmonary:     Effort: Pulmonary effort is normal.     Breath sounds: Normal breath sounds.  Abdominal:     General: Bowel sounds are normal.     Palpations: Abdomen is soft.     Tenderness: There is no abdominal tenderness.  Musculoskeletal:     Cervical back: Normal range of motion and neck supple.  Lymphadenopathy:     Cervical: No cervical adenopathy.  Skin:    General: Skin is warm and dry.  Neurological:     General: No focal deficit present.     Mental Status: She is alert and oriented to person, place, and time.  Psychiatric:        Mood and Affect: Mood  normal.        Behavior: Behavior normal.      UC Treatments / Results  Labs (all labs ordered are listed, but only abnormal results are displayed) Labs Reviewed - No data to display  EKG   Radiology No results found.  Procedures Procedures (including critical care time)  Medications Ordered in UC Medications - No data to display  Initial Impression / Assessment and Plan / UC Course  I have reviewed the triage vital signs and the nursing notes.  Pertinent labs & imaging results that were available during my care of the patient were reviewed by me and considered in my medical decision making (see chart for details).  Patient presents for complaints of sore throat and bilateral ear pain.  On exam, patient has swelling and redness to the canal of the left ear.  There is no erythema or bulging of the tympanic membranes bilaterally.  She also has a left middle ear effusion.  Symptoms are consistent with allergic rhinitis and otitis externa.  Patient was prescribed Floxin eardrops and Zyrtec-D and Bromfed.  Supportive care recommendations were provided to the patient.  Patient advised to follow-up if symptoms fail to improve. Final Clinical  Impressions(s) / UC Diagnoses   Final diagnoses:  Allergic rhinitis, unspecified seasonality, unspecified trigger  Acute otitis externa of left ear, unspecified type     Discharge Instructions      Take medication as prescribed. May take over-the-counter Tylenol or ibuprofen as needed for pain or discomfort. Warm compresses to the affected ear help with comfort. Warm salt water gargles 3-4 times daily as needed for throat pain or discomfort. Recommend warm tea with honey to help with throat discomfort as needed. Do not stick anything inside the ear while symptoms persist. Avoid getting water inside of the ear while symptoms persist. May use normal saline nasal spray to help throughout the day as needed. Follow-up if symptoms worsen or  fail to improve.     ED Prescriptions     Medication Sig Dispense Auth. Provider   brompheniramine-pseudoephedrine-DM 30-2-10 MG/5ML syrup Take 5 mLs by mouth 4 (four) times daily as needed. 140 mL Lin Glazier-Warren, Alda Lea, NP   cetirizine-pseudoephedrine (ZYRTEC-D) 5-120 MG tablet Take 1 tablet by mouth daily. 30 tablet Fountain Derusha-Warren, Alda Lea, NP   ofloxacin (FLOXIN) 0.3 % OTIC solution Place 5 drops into the left ear 2 (two) times daily for 7 days. 5 mL Yeray Tomas-Warren, Alda Lea, NP      PDMP not reviewed this encounter.   Tish Men, NP 09/25/21 1123

## 2021-09-25 NOTE — Discharge Instructions (Addendum)
Take medication as prescribed. May take over-the-counter Tylenol or ibuprofen as needed for pain or discomfort. Warm compresses to the affected ear help with comfort. Warm salt water gargles 3-4 times daily as needed for throat pain or discomfort. Recommend warm tea with honey to help with throat discomfort as needed. Do not stick anything inside the ear while symptoms persist. Avoid getting water inside of the ear while symptoms persist. May use normal saline nasal spray to help throughout the day as needed. Follow-up if symptoms worsen or fail to improve.

## 2021-09-25 NOTE — ED Triage Notes (Signed)
Pt reports sore throat x 2 weeks; bilateral ear pain x 1 week.

## 2021-11-02 ENCOUNTER — Other Ambulatory Visit (HOSPITAL_COMMUNITY): Payer: Self-pay

## 2021-11-13 ENCOUNTER — Other Ambulatory Visit (HOSPITAL_COMMUNITY): Payer: Self-pay

## 2021-11-23 ENCOUNTER — Telehealth: Payer: 59 | Admitting: Physician Assistant

## 2021-11-23 DIAGNOSIS — H60391 Other infective otitis externa, right ear: Secondary | ICD-10-CM

## 2021-11-23 MED ORDER — NEOMYCIN-POLYMYXIN-HC 3.5-10000-1 OT SOLN
3.0000 [drp] | Freq: Four times a day (QID) | OTIC | 0 refills | Status: DC
Start: 2021-11-23 — End: 2022-02-17

## 2021-11-23 NOTE — Progress Notes (Signed)
E Visit for Swimmer's Ear  We are sorry that you are not feeling well. Here is how we plan to help!  I have prescribed: Neomycin 0.35%, polymyxin B 10,000 units/mL, and hydrocortisone 0,5% otic solution 4 drops in affected ears four times a day for 7 days  In certain cases swimmer's ear may progress to a more serious bacterial infection of the middle or inner ear.  If you have a fever 102 and up and significantly worsening symptoms, this could indicate a more serious infection moving to the middle/inner and needs face to face evaluation in an office by a provider.  Your symptoms should improve over the next 3 days and should resolve in about 7 days.  HOME CARE:  Wash your hands frequently. Do not place the tip of the bottle on your ear or touch it with your fingers. You can take Acetominophen 650 mg every 4-6 hours as needed for pain.  If pain is severe or moderate, you can apply a heating pad (set on low) or hot water bottle (wrapped in a towel) to outer ear for 20 minutes.  This will also increase drainage. Avoid ear plugs Do not use Q-tips After showers, help the water run out by tilting your head to one side.  GET HELP RIGHT AWAY IF:  Fever is over 102.2 degrees. You develop progressive ear pain or hearing loss. Ear symptoms persist longer than 3 days after treatment.  MAKE SURE YOU:  Understand these instructions. Will watch your condition. Will get help right away if you are not doing well or get worse.  TO PREVENT SWIMMER'S EAR: Use a bathing cap or custom fitted swim molds to keep your ears dry. Towel off after swimming to dry your ears. Tilt your head or pull your earlobes to allow the water to escape your ear canal. If there is still water in your ears, consider using a hairdryer on the lowest setting.   Thank you for choosing an e-visit.  Your e-visit answers were reviewed by a board certified advanced clinical practitioner to complete your personal care plan.  Depending upon the condition, your plan could have included both over the counter or prescription medications.  Please review your pharmacy choice. Make sure the pharmacy is open so you can pick up prescription now. If there is a problem, you may contact your provider through [employer] and have the prescription routed to another pharmacy.  Your safety is important to [place]. If you have drug allergies check your prescription carefully.   For the next 24 hours you can use MyChart to ask questions about today's visit, request a non-urgent call back, or ask for a work or school excuse. You will get an email in the next two days asking about your experience. I hope that your e-visit has been valuable and will speed your recovery.   I have spent 5 minutes in review of e-visit questionnaire, review and updating patient chart, medical decision making and response to patient.   Mar Daring, PA-C

## 2021-11-26 ENCOUNTER — Ambulatory Visit
Admission: RE | Admit: 2021-11-26 | Discharge: 2021-11-26 | Disposition: A | Discharge status: Home / Self Care | Payer: 59 | Source: Ambulatory Visit | Attending: Family Medicine | Admitting: Family Medicine

## 2021-11-26 VITALS — BP 126/78 | HR 95 | Temp 99.2°F | Resp 20

## 2021-11-26 DIAGNOSIS — J069 Acute upper respiratory infection, unspecified: Secondary | ICD-10-CM | POA: Diagnosis not present

## 2021-11-26 DIAGNOSIS — H66001 Acute suppurative otitis media without spontaneous rupture of ear drum, right ear: Secondary | ICD-10-CM

## 2021-11-26 MED ORDER — PSEUDOEPHEDRINE HCL ER 120 MG PO TB12: 120.0000 mg | ORAL_TABLET | Freq: Two times a day (BID) | ORAL | 0 refills | Status: DC | PRN

## 2021-11-26 MED ORDER — AMOXICILLIN 875 MG PO TABS: 875.0000 mg | ORAL_TABLET | Freq: Two times a day (BID) | ORAL | 0 refills | Status: DC

## 2021-11-26 MED ORDER — FLUTICASONE PROPIONATE 50 MCG/ACT NA SUSP: 1.0000 | Freq: Two times a day (BID) | NASAL | 2 refills | Status: AC

## 2021-11-26 NOTE — ED Provider Notes (Signed)
[facility]    CSN: [id number] Arrival date & time: 11/26/21  1612      History   Chief Complaint Chief Complaint  Patient presents with   Ear Fullness    Ear pain, hoarseness, dizzy spells, pain in neck behind ear, and popping and ringing in right ear - Entered by patient    HPI Lisa Fields is a 41 y.o. female.   Patient presenting today with several day history of sharp stabbing right ear pain, dizzy spells, cough, shortness of breath, sore throat on the right.  Denies fever, chills, body aches, chest pain, abdominal pain, nausea vomiting or diarrhea.  Trying Mucinex type medications with no relief.  No known sick contacts recently.    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 05/11/2017   Insomnia 12/01/2016   Anxiety and depression 11/05/2016   Menorrhagia with regular cycle 11/05/2016   Dysmenorrhea, unspecified 11/05/2016   Gastroesophageal reflux disease 11/05/2016   Irritable bowel syndrome with both constipation and diarrhea 11/05/2016   Fatigue 11/10/2015   Fibromyalgia 11/10/2015   Smoker 11/10/2015   Chronic fatigue 11/10/2015   Depression 08/27/2014    Past Surgical History:  Procedure Laterality Date   APPENDECTOMY     OVARIAN CYST SURGERY     TUBAL LIGATION      OB History     Gravida       Para      Term      Preterm      AB      Living  2      SAB      IAB      Ectopic      Multiple      Live Births               Home Medications    Prior to Admission medications   Medication Sig Start Date End Date Taking? Authorizing Provider  amoxicillin (AMOXIL) 875 MG tablet Take 1 tablet (875 mg total) by mouth 2 (two) times daily. 11/26/21  Yes Volney American, PA-C  fluticasone [facility]) 50 MCG/ACT nasal spray Place 1 spray into both nostrils 2 (two) times daily. 11/26/21  Yes Volney American, PA-C  pseudoephedrine (SUDAFED 12 HOUR) 120 MG 12 hr tablet Take 1 tablet (120 mg total) by mouth every 12 (twelve) hours as needed for congestion. 11/26/21  Yes Volney American, PA-C  aspirin-acetaminophen-caffeine (EXCEDRIN MIGRAINE) [phone] MG tablet Take 2 tablets by mouth every 6 (six) hours as needed for headache.    [provider]  cetirizine-pseudoephedrine (ZYRTEC-D) 5-120 MG tablet Take 1 tablet by mouth daily. 09/25/21   Leath-Warren, Alda Lea, NP  Cholecalciferol (VITAMIN D PO) Take by mouth.    [provider]  EPINEPHrine 0.3 mg/0.3 mL IJ SOAJ injection INJECT  0 3 MLS  0 3 MG TOTAL  INTO THE MUSCLE ONCE FOR 1 DOSE 05/12/17   [provider]  fexofenadine (ALLEGRA) 180 MG tablet Take 1 tablet (180 mg total) by mouth daily. 07/15/21   Kathyrn Drown, MD  Multiple Vitamin (MULTIVITAMIN) tablet Take 1 tablet by mouth daily.    [provider]  neomycin-polymyxin-hydrocortisone (CORTISPORIN) OTIC solution Place 3 drops into the right ear 4 (four) times daily. X 7 days 11/23/21   Mar Daring, PA-C  nicotine (NICODERM CQ - DOSED IN MG/24 HOURS) 21 mg/24hr patch Place 1 patch (21 mg total) onto the skin daily. 11/10/20   Kathyrn Drown, MD  Probiotic Product (PROBIOTIC DAILY PO) Take by mouth.    [provider]  SUMAtriptan (IMITREX) 50 MG tablet Take 1 tablet at onset of migraine. May repeat in  2 hours if headache persists or recurs. Max 2 per 24 hrs 02/10/21   Kathyrn Drown, MD  traZODone (DESYREL) 100 MG tablet Take 1 tablet  by mouth at bedtime. 02/10/21   Kathyrn Drown, MD    Family History Family History  Problem Relation Age of Onset   Asthma Sister    Asthma Brother    Allergic rhinitis Neg Hx    Eczema Neg Hx    Immunodeficiency Neg Hx    Urticaria Neg Hx    Angioedema Neg Hx     Social History Social History   Tobacco Use   Smoking status: Every Day    Packs/day: 0.50    Types: Cigarettes    Start date: 07/23/2013   Smokeless tobacco: Never  Substance Use Topics   Alcohol use: No   Drug use: No     Allergies   Ivp dye [iodinated contrast media], Sulfa antibiotics, Codeine, Latex, Morphine and related, Topamax [topiramate], and Vicodin [hydrocodone-acetaminophen]   Review of Systems Review of Systems Per HPI  Physical Exam Triage Vital Signs ED Triage Vitals  Enc Vitals Group     BP 11/26/21 1617 126/78     Pulse Rate 11/26/21 1617 95     Resp 11/26/21 1617 20     Temp 11/26/21 1617 99.2 F (37.3 C)     Temp Source 11/26/21 1617 Oral     SpO2 11/26/21 1617 99 %     Weight --      Height --      Head Circumference --      Peak Flow --      Pain Score 11/26/21 1618 10     Pain Loc --      Pain Edu? --      Excl. in [place]? --    No data found.  Updated Vital Signs BP 126/78 (BP Location: Right Arm)   Pulse 95   Temp 99.2 F (37.3 C) (Oral)   Resp 20   LMP 09/28/2021 (Approximate)   SpO2 99%   Visual Acuity Right Eye Distance:   Left Eye Distance:   Bilateral Distance:    Right Eye Near:   Left Eye Near:    Bilateral Near:     Physical Exam Vitals and nursing note reviewed.  Constitutional:      Appearance: Normal appearance.  HENT:     Head: Atraumatic.     Right Ear: External ear normal.     Left Ear: Tympanic membrane and external ear normal.     Ears:     Comments: Right TM erythematous and edematous    Nose:  Rhinorrhea present.     Mouth/Throat:     Mouth: Mucous membranes are moist.     Pharynx: Posterior oropharyngeal erythema present.  Eyes:     Extraocular Movements: Extraocular movements intact.     Conjunctiva/sclera: Conjunctivae normal.  Cardiovascular:     Rate and Rhythm: Normal rate and regular rhythm.     Heart sounds: Normal heart sounds.  Pulmonary:     Effort: Pulmonary effort is normal.     Breath sounds: Normal breath sounds. No wheezing or rales.  Musculoskeletal:        General: Normal range of motion.     Cervical back: Normal range of motion and neck supple.  Lymphadenopathy:     Cervical: No cervical adenopathy.  Skin:    General: Skin is warm and dry.  Neurological:     Mental Status: She is alert and oriented to person, place, and time.  Psychiatric:        Mood and Affect: Mood normal.        Thought Content: Thought content normal.      UC Treatments / Results  Labs (all labs ordered are listed, but only abnormal results are displayed) Labs Reviewed - No data to display  EKG   Radiology No results found.  Procedures Procedures (including critical care time)  Medications Ordered in UC Medications - No data to display  Initial Impression / Assessment and Plan / UC Course  I have reviewed the triage vital signs and the nursing notes.  Pertinent labs & imaging results that were available during my care of the patient were reviewed by me and considered in my medical decision making (see chart for details).     The ear infection with Amoxil, Flonase, Sudafed.  Ibuprofen and Tylenol for pain.  Return for worsening symptoms.  Final Clinical Impressions(s) / UC Diagnoses   Final diagnoses:  Acute suppurative otitis media of right ear without spontaneous rupture of tympanic membrane, recurrence not specified  Viral URI   Discharge Instructions   None    ED Prescriptions     Medication Sig Dispense Auth. Provider   amoxicillin (AMOXIL)  875 MG tablet Take 1 tablet (875 mg total) by mouth 2 (two) times daily. 20 tablet Volney American, PA-C   fluticasone [facility]) 50 MCG/ACT nasal spray Place 1 spray into both nostrils 2 (two) times daily. 16 g Volney American, [place]   pseudoephedrine (SUDAFED 12 HOUR) 120 MG 12 hr tablet Take 1 tablet (120 mg total) by mouth every 12 (twelve) hours as needed for congestion. 20 tablet Volney American, [place]      PDMP not reviewed this encounter.   Volney American, [place] 11/26/21 1818

## 2021-11-26 NOTE — ED Triage Notes (Signed)
Received covid booster last week and became sick after.  Right ear pain, ear feels full.  Dizzy spells and states she becomes SOB really easy.  Some coughing.  Covid test today was negative.

## 2021-12-03 ENCOUNTER — Other Ambulatory Visit (HOSPITAL_COMMUNITY): Payer: Self-pay

## 2022-01-14 ENCOUNTER — Telehealth: Payer: 59 | Admitting: Emergency Medicine

## 2022-01-14 DIAGNOSIS — J189 Pneumonia, unspecified organism: Secondary | ICD-10-CM | POA: Diagnosis not present

## 2022-01-14 MED ORDER — DOXYCYCLINE HYCLATE 100 MG PO TABS: 100.0000 mg | ORAL_TABLET | Freq: Two times a day (BID) | ORAL | 0 refills | Status: AC

## 2022-01-14 MED ORDER — SPACER/AERO-HOLDING CHAMBERS DEVI: 1.0000 | 0 refills | Status: AC | PRN

## 2022-01-14 MED ORDER — ALBUTEROL SULFATE HFA 108 (90 BASE) MCG/ACT IN AERS: 2.0000 | INHALATION_SPRAY | Freq: Four times a day (QID) | RESPIRATORY_TRACT | 0 refills | Status: DC | PRN

## 2022-01-14 NOTE — Progress Notes (Signed)
We are sorry that you are not feeling well.  Here is how we plan to help!  Based on your presentation I believe you most likely have A cough due to bacteria.  When patients have a fever and a productive cough with a change in color or increased sputum production, we are concerned about bacterial bronchitis.  If left untreated it can progress to pneumonia.  If your symptoms do not improve with your treatment plan it is important that you contact your provider.   I have prescribed Doxycycline 100 mg twice a day for 7 days     In addition you may use A non-prescription cough medication called Mucinex DM: take 2 tablets every 12 hours.  I have also prescribed an albuterol inhaler for you to use with a spacer.   From your responses in the eVisit questionnaire you describe inflammation in the upper respiratory tract which is causing a significant cough.  This is commonly called Bronchitis and has four common causes:   Allergies Viral Infections Acid Reflux Bacterial Infection Allergies, viruses and acid reflux are treated by controlling symptoms or eliminating the cause. An example might be a cough caused by taking certain blood pressure medications. You stop the cough by changing the medication. Another example might be a cough caused by acid reflux. Controlling the reflux helps control the cough.  USE OF BRONCHODILATOR ("RESCUE") INHALERS: There is a risk from using your bronchodilator too frequently.  The risk is that over-reliance on a medication which only relaxes the muscles surrounding the breathing tubes can reduce the effectiveness of medications prescribed to reduce swelling and congestion of the tubes themselves.  Although you feel brief relief from the bronchodilator inhaler, your asthma may actually be worsening with the tubes becoming more swollen and filled with mucus.  This can delay other crucial treatments, such as oral steroid medications. If you need to use a bronchodilator inhaler  daily, several times per day, you should discuss this with your provider.  There are probably better treatments that could be used to keep your asthma under control.     HOME CARE Only take medications as instructed by your medical team. Complete the entire course of an antibiotic. Drink plenty of fluids and get plenty of rest. Avoid close contacts especially the very young and the elderly Cover your mouth if you cough or cough into your sleeve. Always remember to wash your hands A steam or ultrasonic humidifier can help congestion.   GET HELP RIGHT AWAY IF: You develop worsening fever. You become short of breath You cough up blood. Your symptoms persist after you have completed your treatment plan MAKE SURE YOU  Understand these instructions. Will watch your condition. Will get help right away if you are not doing well or get worse.    Thank you for choosing an e-visit.  Your e-visit answers were reviewed by a board certified advanced clinical practitioner to complete your personal care plan. Depending upon the condition, your plan could have included both over the counter or prescription medications.  Please review your pharmacy choice. Make sure the pharmacy is open so you can pick up prescription now. If there is a problem, you may contact your provider through [employer] and have the prescription routed to another pharmacy.  Your safety is important to [place]. If you have drug allergies check your prescription carefully.   For the next 24 hours you can use MyChart to ask questions about today's visit, request a non-urgent call back,  or ask for a work or school excuse. You will get an email in the next two days asking about your experience. I hope that your e-visit has been valuable and will speed your recovery.  I have spent 5 minutes in review of e-visit questionnaire, review and updating patient chart, medical decision making and response to patient.   Willeen Cass, PhD,  FNP-BC

## 2022-01-19 ENCOUNTER — Ambulatory Visit
Admission: EM | Admit: 2022-01-19 | Discharge: 2022-01-19 | Disposition: A | Discharge status: Home / Self Care | Payer: 59 | Attending: Family Medicine | Admitting: Family Medicine

## 2022-01-19 ENCOUNTER — Ambulatory Visit (INDEPENDENT_AMBULATORY_CARE_PROVIDER_SITE_OTHER): Payer: 59

## 2022-01-19 DIAGNOSIS — R059 Cough, unspecified: Secondary | ICD-10-CM

## 2022-01-19 DIAGNOSIS — B3731 Acute candidiasis of vulva and vagina: Secondary | ICD-10-CM | POA: Diagnosis not present

## 2022-01-19 DIAGNOSIS — R0602 Shortness of breath: Secondary | ICD-10-CM | POA: Diagnosis not present

## 2022-01-19 DIAGNOSIS — J22 Unspecified acute lower respiratory infection: Secondary | ICD-10-CM

## 2022-01-19 MED ORDER — PREDNISONE 20 MG PO TABS: 40.0000 mg | ORAL_TABLET | Freq: Every day | ORAL | 0 refills | Status: DC

## 2022-01-19 MED ORDER — PROMETHAZINE-DM 6.25-15 MG/5ML PO SYRP: 5.0000 mL | ORAL_SOLUTION | Freq: Four times a day (QID) | ORAL | 0 refills | Status: DC | PRN

## 2022-01-19 MED ORDER — FLUCONAZOLE 150 MG PO TABS: 150.0000 mg | ORAL_TABLET | ORAL | 0 refills | Status: DC

## 2022-01-19 NOTE — ED Triage Notes (Addendum)
Pt reports a teledoc said she had pneumonia x 5 days. She has post nasal drip, coughing until she throws up, gets worst at night, "Burns when she breathes." Took mucinex, doxycyline, albuterol, and Flonase but only slight relief. Reports harp pains in abdominal and lower back region. Docyciline has given her a yeast infection.

## 2022-01-19 NOTE — ED Provider Notes (Signed)
[facility]    CSN: [id number] Arrival date & time: 01/19/22  1017      History   Chief Complaint Chief Complaint  Patient presents with   Cough    HPI Lisa Fields is a 41 y.o. female.   Patient presenting today with going on 2 weeks of productive cough, wheezing, chest tightness, shortness of breath, coughing sometimes so hard that she throws up.  Did a TeleDoc visit about 5 days ago and was given Mucinex, doxycycline, albuterol and Flonase but this only provided slight relief.  Now having some soreness and pain from coughing so much.  Denies fever, abdominal pain, diarrhea, rashes.  She also notes that the antibiotic seems to have given her a yeast infection as she is having discharge and itching since starting it.    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 05/11/2017   Insomnia 12/01/2016   Anxiety and depression 11/05/2016   Menorrhagia with regular cycle 11/05/2016   Dysmenorrhea, unspecified 11/05/2016   Gastroesophageal reflux disease 11/05/2016   Irritable bowel syndrome with both constipation and diarrhea 11/05/2016   Fatigue 11/10/2015   Fibromyalgia 11/10/2015   Smoker 11/10/2015   Chronic fatigue 11/10/2015   Depression 08/27/2014    Past Surgical History:  Procedure Laterality Date   APPENDECTOMY      OVARIAN CYST SURGERY     TUBAL LIGATION      OB History     Gravida      Para      Term      Preterm      AB      Living  2      SAB      IAB      Ectopic      Multiple      Live Births               Home Medications    Prior to Admission medications   Medication Sig Start Date End Date Taking? Authorizing Provider  fluconazole (DIFLUCAN) 150 MG tablet Take 1 tablet (150 mg total) by mouth every other day. 01/19/22  Yes Volney American, PA-C  predniSONE (DELTASONE) 20 MG tablet Take 2 tablets (40 mg total) by mouth daily with breakfast. 01/19/22  Yes Volney American, PA-C  promethazine-dextromethorphan (PROMETHAZINE-DM) 6.25-15 MG/5ML syrup Take 5 mLs by mouth 4 (four) times daily as needed. 01/19/22  Yes Volney American, PA-C  albuterol (VENTOLIN HFA) 108 (90 Base) MCG/ACT inhaler Inhale 2 puffs into the lungs every 6 (six) hours as needed for wheezing or shortness of breath. 01/14/22   Carvel Getting, NP  aspirin-acetaminophen-caffeine (EXCEDRIN MIGRAINE) [phone] MG tablet Take 2 tablets by mouth every 6 (six) hours as needed for headache.    [provider]  cetirizine-pseudoephedrine (ZYRTEC-D) 5-120  MG tablet Take 1 tablet by mouth daily. 09/25/21   Leath-Warren, Alda Lea, NP  Cholecalciferol (VITAMIN D PO) Take by mouth.    [provider]  doxycycline (VIBRA-TABS) 100 MG tablet Take 1 tablet (100 mg total) by mouth 2 (two) times daily for 7 days. 01/14/22 01/21/22  Carvel Getting, NP  EPINEPHrine 0.3 mg/0.3 mL IJ SOAJ injection INJECT 0 3 MLS  0 3 MG TOTAL  INTO THE MUSCLE ONCE FOR 1 DOSE 05/12/17   [provider]  fexofenadine (ALLEGRA) 180 MG tablet Take 1 tablet (180 mg total) by mouth daily. 07/15/21   Kathyrn Drown, MD  fluticasone (FLONASE) 50 MCG/ACT nasal spray Place 1 spray into both nostrils 2 (two) times daily. 11/26/21   Volney American, PA-C  Multiple Vitamin (MULTIVITAMIN) tablet Take 1  tablet by mouth daily.    [provider]  neomycin-polymyxin-hydrocortisone (CORTISPORIN) OTIC solution Place 3 drops into the right ear 4 (four) times daily. X 7 days 11/23/21   Mar Daring, PA-C  nicotine (NICODERM CQ - DOSED IN MG/24 HOURS) 21 mg/24hr patch Place 1 patch (21 mg total) onto the skin daily. 11/10/20   Kathyrn Drown, MD  Probiotic Product (PROBIOTIC DAILY PO) Take by mouth.    [provider]  pseudoephedrine (SUDAFED 12 HOUR) 120 MG 12 hr tablet Take 1 tablet (120 mg total) by mouth every 12 (twelve) hours as needed for congestion. 11/26/21   Volney American, PA-C  [place] 1 each by Does not apply route as needed. 01/14/22   Carvel Getting, NP  SUMAtriptan (IMITREX) 50 MG tablet Take 1 tablet at onset of migraine. May repeat in 2 hours if headache persists or recurs. Max 2 per 24 hrs 02/10/21   Kathyrn Drown, MD  traZODone (DESYREL) 100 MG tablet Take 1 tablet  by mouth at bedtime. 02/10/21   Kathyrn Drown, MD    Family History Family History  Problem Relation Age of Onset   Asthma Sister    Asthma Brother    Allergic rhinitis Neg Hx    Eczema Neg Hx    Immunodeficiency Neg Hx    Urticaria Neg Hx    Angioedema Neg Hx     Social History Social History   Tobacco Use   Smoking status: Every Day    Packs/day: 0.50    Types: Cigarettes    Start date: 07/23/2013   Smokeless tobacco: Never  Substance Use Topics   Alcohol use: No   Drug use: No     Allergies   Ivp dye [iodinated contrast media], Sulfa antibiotics, Codeine, Latex, Morphine and related, Topamax [topiramate], and Vicodin [hydrocodone-acetaminophen]   Review of Systems Review of Systems HPI  Physical Exam Triage Vital Signs ED Triage Vitals  Enc Vitals Group     BP 01/19/22 1222 114/81     Pulse Rate 01/19/22 1222 (!) 113     Resp 01/19/22 1222 20     Temp 01/19/22 1222 98.6 F (37 C)     Temp Source 01/19/22 1222 Oral      SpO2 01/19/22 1222 97 %     Weight --      Height --      Head Circumference --      Peak Flow --      Pain Score 01/19/22 1329 0     Pain Loc --      Pain Edu? --      Excl. in  [place]? --    No data found.  Updated Vital Signs BP 114/81 (BP Location: Right Arm)   Pulse (!) 113   Temp 98.6 F (37 C) (Oral)   Resp 20   LMP 01/03/2022 (Exact Date)   SpO2 97%   Visual Acuity Right Eye Distance:   Left Eye Distance:   Bilateral Distance:    Right Eye Near:   Left Eye Near:    Bilateral Near:     Physical Exam Vitals and nursing note reviewed.  Constitutional:      Appearance: Normal appearance.  HENT:     Head: Atraumatic.     Right Ear: Tympanic membrane and external ear normal.     Left Ear: Tympanic membrane and external ear normal.     Nose: Congestion present.     Mouth/Throat:     Mouth: Mucous membranes are moist.     Pharynx: Posterior oropharyngeal erythema present.  Eyes:     Extraocular Movements: Extraocular movements intact.     Conjunctiva/sclera: Conjunctivae normal.  Cardiovascular:     Rate and Rhythm: Normal rate and regular rhythm.     Heart sounds: Normal heart sounds.  Pulmonary:     Effort: Pulmonary effort is normal.     Breath sounds: Wheezing present.  Musculoskeletal:        General: Normal range of motion.     Cervical back: Normal range of motion and neck supple.  Skin:    General: Skin is warm and dry.  Neurological:     Mental Status: She is alert and oriented to person, place, and time.  Psychiatric:        Mood and Affect: Mood normal.        Thought Content: Thought content normal.      UC Treatments / Results  Labs (all labs ordered are listed, but only abnormal results are displayed) Labs Reviewed - No data to display  EKG   Radiology DG Chest 2 View  Result Date: 01/19/2022 CLINICAL DATA:  Cough and shortness of breath for 1 week not improving with antibiotics, had pneumonia for 5 days, postnasal drip, coughing,  post tussive emesis EXAM: CHEST - 2 VIEW COMPARISON:  10/08/2020 FINDINGS: Normal heart size, mediastinal contours, and pulmonary vascularity. Central peribronchial thickening. Lungs clear. No pulmonary infiltrate, pleural effusion, or pneumothorax. Minimal biconvex thoracic scoliosis. IMPRESSION: Bronchitic changes without infiltrate. Electronically Signed   By: Lavonia Dana M.D.   On: 01/19/2022 13:13    Procedures Procedures (including critical care time)  Medications Ordered in UC Medications - No data to display  Initial Impression / Assessment and Plan / UC Course  I have reviewed the triage vital signs and the nursing notes.  Pertinent labs & imaging results that were available during my care of the patient were reviewed by me and considered in my medical decision making (see chart for details).     Chest x-ray negative for pneumonia, positive for bronchitis type illness which is consistent with exam findings.  Treat with prednisone, Phenergan DM, continue supportive over-the-counter medications and home care and Diflucan sent for suspected yeast vaginitis secondary to antibiotics.  Return for worsening symptoms.  Work note given.  Final Clinical Impressions(s) / UC Diagnoses   Final diagnoses:  Lower respiratory infection  Yeast vaginitis   Discharge Instructions   None    ED Prescriptions     Medication Sig Dispense Auth. Provider   predniSONE (DELTASONE) 20 MG tablet Take 2 tablets (40 mg total) by  mouth daily with breakfast. 10 tablet Volney American, PA-C   promethazine-dextromethorphan (PROMETHAZINE-DM) 6.25-15 MG/5ML syrup Take 5 mLs by mouth 4 (four) times daily as needed. 100 mL Volney American, PA-C   fluconazole (DIFLUCAN) 150 MG tablet Take 1 tablet (150 mg total) by mouth every other day. 3 tablet Volney American, [place]      PDMP not reviewed this encounter.   Volney American, [place] 01/19/22 1631

## 2022-02-17 ENCOUNTER — Encounter: Payer: Self-pay | Admitting: Family Medicine

## 2022-02-17 ENCOUNTER — Ambulatory Visit: Payer: 59 | Admitting: Family Medicine

## 2022-02-17 VITALS — BP 114/74 | HR 92 | Ht 69.0 in | Wt 172.1 lb

## 2022-02-17 DIAGNOSIS — R059 Cough, unspecified: Secondary | ICD-10-CM

## 2022-02-17 DIAGNOSIS — F419 Anxiety disorder, unspecified: Secondary | ICD-10-CM | POA: Diagnosis not present

## 2022-02-17 DIAGNOSIS — H9311 Tinnitus, right ear: Secondary | ICD-10-CM | POA: Diagnosis not present

## 2022-02-17 DIAGNOSIS — G43019 Migraine without aura, intractable, without status migrainosus: Secondary | ICD-10-CM | POA: Diagnosis not present

## 2022-02-17 DIAGNOSIS — H6091 Unspecified otitis externa, right ear: Secondary | ICD-10-CM | POA: Diagnosis not present

## 2022-02-17 DIAGNOSIS — F339 Major depressive disorder, recurrent, unspecified: Secondary | ICD-10-CM

## 2022-02-17 DIAGNOSIS — J310 Chronic rhinitis: Secondary | ICD-10-CM

## 2022-02-17 DIAGNOSIS — G43109 Migraine with aura, not intractable, without status migrainosus: Secondary | ICD-10-CM

## 2022-02-17 DIAGNOSIS — H9313 Tinnitus, bilateral: Secondary | ICD-10-CM

## 2022-02-17 DIAGNOSIS — H60391 Other infective otitis externa, right ear: Secondary | ICD-10-CM | POA: Diagnosis not present

## 2022-02-17 DIAGNOSIS — F32A Depression, unspecified: Secondary | ICD-10-CM

## 2022-02-17 MED ORDER — SUMATRIPTAN SUCCINATE 50 MG PO TABS
ORAL_TABLET | ORAL | 2 refills | Status: DC
  Filled 2022-02-17: qty 10, 25d supply, fill #0
  Filled 2022-03-13: qty 10, 25d supply, fill #1
  Filled 2022-04-30: qty 10, 30d supply, fill #2

## 2022-02-17 MED ORDER — NEOMYCIN-POLYMYXIN-HC 3.5-10000-1 OT SOLN
3.0000 [drp] | Freq: Four times a day (QID) | OTIC | 0 refills | Status: DC
  Filled 2022-02-17: qty 10, 10d supply, fill #0

## 2022-02-17 MED ORDER — BUPROPION HCL ER (SR) 150 MG PO TB12
150.0000 mg | ORAL_TABLET | Freq: Two times a day (BID) | ORAL | 0 refills | Status: DC
  Filled 2022-02-17: qty 60, 30d supply, fill #0

## 2022-02-17 NOTE — Patient Instructions (Addendum)
Please schedule pap smear: 2 months  I appreciate the opportunity to provide care to you today!    Follow up:  1 months depression  Labs: next visit    Please continue to a heart-healthy diet and increase your physical activities. Try to exercise for 55mns at least five times a week.      It was a pleasure to see you and I look forward to continuing to work together on your health and well-being. Please do not hesitate to call the office if you need care or have questions about your care.   Have a wonderful day and week. With Gratitude, GAlvira MondayMSN, FNP-BC

## 2022-02-17 NOTE — Progress Notes (Unsigned)
New Patient Office Visit  Subjective:  Patient ID: Lisa Fields, female    DOB: [birth year]  Age: 42 y.o. MRN: [id number]  CC:  Chief Complaint  Patient presents with   Establish Care    Previously seen by [place] family, needs to establish with new pcp. Pt would like to discuss migraines on and off since 3 years ago (02/18/2019) uses imitrex for this. Also c/o right ear pain for about a week now 02/10/2022.    Anxiety    Pt reports sx of depression and anxiety has been causing disruption at work.     HPI Lisa Fields is a 42 y.o. female with past medical history of anxiety and depression, smoker, chronic fatigue, chronic rhinitis, and fibromyalgia presents for establishing care. For the details of today's visit, please refer to the assessment and plan.     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  April 2019   Bronchitis    Cervical lymphadenitis 11/10/2015   Chronic fatigue 11/10/2015   Chronic headaches    Fatigue 11/10/2015   Fibromyalgia 11/10/2015   Myalgia 11/10/2015   Ovarian cyst    Smoker 11/10/2015    Past Surgical History:  Procedure Laterality Date   APPENDECTOMY     OVARIAN CYST SURGERY     TUBAL LIGATION      Family History  Problem Relation Age of Onset   Asthma Sister    Asthma Brother    Allergic rhinitis Neg Hx    Eczema Neg Hx    Immunodeficiency Neg Hx    Urticaria Neg Hx    Angioedema Neg Hx     Social History   Socioeconomic History   Marital status: Significant Other    Spouse name: Not on file   Number of children: Not on file   Years of education: Not on file   Highest education level: Not on file  Occupational History   Not on file  Tobacco Use   Smoking status: Every Day    Packs/day: 0.50    Types: Cigarettes    Start date: 07/23/2013   Smokeless tobacco: Never  Substance and Sexual Activity   Alcohol use: No   Drug  use: No   Sexual activity: Yes    Birth control/protection: Surgical  Other Topics Concern   Not on file  Social History Narrative   Not on file   Social Determinants of Health   Financial Resource Strain: Not on file  Food Insecurity: Not on file  Transportation Needs: Not on file  Physical Activity: Not on file  Stress: Not on file  Social Connections: Not on file  Intimate Partner Violence: Not on file    ROS Review of Systems  Constitutional:  Negative for chills, diaphoresis and fever.  HENT:  Positive for ear pain and tinnitus. Negative for ear discharge.   Eyes:  Negative for visual disturbance.  Respiratory:  Negative for chest tightness and shortness of breath.   Neurological:  Positive for headaches. Negative for dizziness.    Objective:   Today's Vitals: BP 114/74   Pulse 92   Ht '5\' 9"'$  (1.753 m)   Wt 172 lb 1.9 oz (78.1 kg)   LMP 01/03/2022 (Exact Date)   SpO2 98%   BMI 25.42 kg/m   Physical Exam HENT:     Right Ear: Hearing, tympanic membrane, ear canal and external ear  normal. No decreased hearing noted. Tenderness present. No swelling. No middle ear effusion. There is no impacted cerumen.     Left Ear: Hearing, tympanic membrane, ear canal and external ear normal. No decreased hearing noted. No swelling or tenderness.  No middle ear effusion. There is no impacted cerumen.     Ears:     Comments: Tenderness with palpation of the helix of the right auricle     Assessment & Plan:   Recurrent otitis externa of right ear Assessment & Plan: Onset of symptoms since 02/10/2022 She denies fever and chills She complains of tinnitus, and tragal pain Denies otorrhea/purulent discharge in external canal She was treated on 11/23/2021 for acute infective otitis externa of the right ear and was treated on 11/26/2021 for otitis media Tympanic membrane is pearly gray in color and translucent We will treat today with otic antibiotic Encouraged to take Tylenol as  needed for pain   Orders: -     Ambulatory referral to ENT  Intractable migraine without aura and without status migrainosus Assessment & Plan: Complains of migraine headaches  She reports having migraine headaches during her menstrual cycle, Likely due to hormonal changes Duration of headache for 7 days Symptoms relief with sumatriptan 50 mg Reports photophobia and phonophobia Was on Emgality by her previous provider, but discontinued therapy due to side effects Not still having a dull nagging headache Patient has not follow-up with neurology concerning her migraine headaches Encouraged to take Tylenol as needed and sumatriptan 50 mg We will follow-up on symptoms in a month   Anxiety and depression Assessment & Plan: Complains of increased anxiety and depression Denies suicidal ideation and thoughts She was on Wellbutrin 150 mg daily, but treatment was discontinued due to relief of her symptoms She denies suicidal ideation and thoughts Will reinstate patient on Wellbutrin 150 mg daily Will follow-up in a month   Tinnitus of right ear Assessment & Plan: Onset of symptoms since having COVID in June 2023 She complains of a constant ringing sound in her right ear during the day, noting to have bilateral ringing in her ears when fatigue She does report occasional dizziness with positional changes Patient has had frequent infective otitis externa and otitis media Will refer to ENT for further evaluation    Orders: -     Ambulatory referral to ENT  Depression, recurrent ([place]) -     buPROPion HCl ER (SR); Take 1 tablet (150 mg total) by mouth daily.  Dispense: 30 tablet; Refill: 0  Migraine with aura and without status migrainosus, not intractable -     SUMAtriptan Succinate; Take 1 tablet at onset of migraine. May repeat in 2 hours if headache persists or recurs. Max 2 per 24 hrs  Dispense: 10 tablet; Refill: 2  Other infective acute otitis externa of right ear -      Neomycin-Polymyxin-HC; Place 3 drops into the right ear 4 (four) times daily. X 7 days  Dispense: 10 mL; Refill: 0     Follow-up: Return in about 1 month (around 03/20/2022).   Alvira Monday, FNP

## 2022-02-18 ENCOUNTER — Other Ambulatory Visit: Payer: Self-pay

## 2022-02-18 ENCOUNTER — Other Ambulatory Visit (HOSPITAL_COMMUNITY): Payer: Self-pay

## 2022-02-18 DIAGNOSIS — H6091 Unspecified otitis externa, right ear: Secondary | ICD-10-CM | POA: Insufficient documentation

## 2022-02-18 DIAGNOSIS — H9311 Tinnitus, right ear: Secondary | ICD-10-CM | POA: Insufficient documentation

## 2022-02-18 MED ORDER — BUPROPION HCL ER (SR) 150 MG PO TB12
150.0000 mg | ORAL_TABLET | Freq: Every day | ORAL | 0 refills | Status: DC
  Filled 2022-02-18: qty 30, 30d supply, fill #0

## 2022-02-18 NOTE — Assessment & Plan Note (Signed)
Onset of symptoms since 02/10/2022 She denies fever and chills She complains of tinnitus, and tragal pain Denies otorrhea/purulent discharge in external canal She was treated on 11/23/2021 for acute infective otitis externa of the right ear and was treated on 11/26/2021 for otitis media Tympanic membrane is pearly gray in color and translucent We will treat today with otic antibiotic Encouraged to take Tylenol as needed for pain

## 2022-02-18 NOTE — Assessment & Plan Note (Signed)
Onset of symptoms since having COVID in June 2023 She complains of a constant ringing sound in her right ear during the day, noting to have bilateral ringing in her ears when fatigue She does report occasional dizziness with positional changes Patient has had frequent infective otitis externa and otitis media Will refer to ENT for further evaluation

## 2022-02-18 NOTE — Assessment & Plan Note (Signed)
Complains of migraine headaches  She reports having migraine headaches during her menstrual cycle, Likely due to hormonal changes Duration of headache for 7 days Symptoms relief with sumatriptan 50 mg Reports photophobia and phonophobia Was on Emgality by her previous provider, but discontinued therapy due to side effects Not still having a dull nagging headache Patient has not follow-up with neurology concerning her migraine headaches Encouraged to take Tylenol as needed and sumatriptan 50 mg We will follow-up on symptoms in a month

## 2022-02-18 NOTE — Assessment & Plan Note (Signed)
Complains of increased anxiety and depression Denies suicidal ideation and thoughts She was on Wellbutrin 150 mg daily, but treatment was discontinued due to relief of her symptoms She denies suicidal ideation and thoughts Will reinstate patient on Wellbutrin 150 mg daily Will follow-up in a month

## 2022-02-19 ENCOUNTER — Other Ambulatory Visit: Payer: Self-pay

## 2022-02-23 ENCOUNTER — Other Ambulatory Visit (HOSPITAL_COMMUNITY): Payer: Self-pay

## 2022-03-13 ENCOUNTER — Other Ambulatory Visit: Payer: Self-pay | Admitting: Family Medicine

## 2022-03-13 DIAGNOSIS — F339 Major depressive disorder, recurrent, unspecified: Secondary | ICD-10-CM

## 2022-03-15 ENCOUNTER — Other Ambulatory Visit: Payer: Self-pay

## 2022-03-15 ENCOUNTER — Other Ambulatory Visit (HOSPITAL_COMMUNITY): Payer: Self-pay

## 2022-03-15 MED ORDER — BUPROPION HCL ER (SR) 150 MG PO TB12
150.0000 mg | ORAL_TABLET | Freq: Every day | ORAL | 0 refills | Status: DC
  Filled 2022-03-15: qty 30, 30d supply, fill #0

## 2022-03-24 ENCOUNTER — Ambulatory Visit: Payer: 59 | Admitting: Family Medicine

## 2022-03-24 ENCOUNTER — Other Ambulatory Visit (HOSPITAL_COMMUNITY): Payer: Self-pay | Admitting: Family Medicine

## 2022-03-24 ENCOUNTER — Other Ambulatory Visit (HOSPITAL_COMMUNITY)
Admission: RE | Admit: 2022-03-24 | Discharge: 2022-03-24 | Disposition: A | Discharge status: Home / Self Care | Payer: 59 | Source: Ambulatory Visit | Attending: Family Medicine | Admitting: Family Medicine

## 2022-03-24 ENCOUNTER — Encounter: Payer: Self-pay | Admitting: Family Medicine

## 2022-03-24 VITALS — BP 114/75 | HR 87 | Ht 69.0 in | Wt 171.0 lb

## 2022-03-24 DIAGNOSIS — Z124 Encounter for screening for malignant neoplasm of cervix: Secondary | ICD-10-CM

## 2022-03-24 DIAGNOSIS — R7301 Impaired fasting glucose: Secondary | ICD-10-CM | POA: Diagnosis not present

## 2022-03-24 DIAGNOSIS — E559 Vitamin D deficiency, unspecified: Secondary | ICD-10-CM | POA: Diagnosis not present

## 2022-03-24 DIAGNOSIS — N6320 Unspecified lump in the left breast, unspecified quadrant: Secondary | ICD-10-CM

## 2022-03-24 DIAGNOSIS — E038 Other specified hypothyroidism: Secondary | ICD-10-CM

## 2022-03-24 DIAGNOSIS — E7849 Other hyperlipidemia: Secondary | ICD-10-CM | POA: Diagnosis not present

## 2022-03-24 DIAGNOSIS — R58 Hemorrhage, not elsewhere classified: Secondary | ICD-10-CM | POA: Diagnosis not present

## 2022-03-24 DIAGNOSIS — Z1231 Encounter for screening mammogram for malignant neoplasm of breast: Secondary | ICD-10-CM | POA: Diagnosis not present

## 2022-03-24 NOTE — Progress Notes (Signed)
Established Patient Office Visit  Subjective:  Patient ID: Lisa Fields, female    DOB: 04/28/1980  Age: 42 y.o. MRN: [id number]  CC:  Chief Complaint  Patient presents with   Follow-up    1 month f/u.     HPI Lisa Fields is a 42 y.o. female with past medical history of depression, fatigue, GERD, and  dysmenorrhea presents for f/u of  chronic medical conditions.  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  April 2019   Bronchitis    Cervical lymphadenitis 11/10/2015   Chronic fatigue 11/10/2015   Chronic headaches    Fatigue 11/10/2015   Fibromyalgia 11/10/2015   Myalgia 11/10/2015   Ovarian cyst    Smoker 11/10/2015    Past Surgical History:  Procedure Laterality Date   APPENDECTOMY     OVARIAN CYST SURGERY     TUBAL LIGATION      Family History  Problem Relation Age of Onset   Asthma Sister    Asthma Brother    Allergic rhinitis Neg Hx    Eczema Neg Hx    Immunodeficiency Neg Hx    Urticaria Neg Hx    Angioedema Neg Hx     Social History   Socioeconomic History   Marital status: Significant Other    Spouse name: Not on file   Number of children: Not on file   Years of education: Not on file   Highest education level: Not on file  Occupational History   Not on file  Tobacco Use   Smoking status: Every Day    Packs/day: 0.50    Types: Cigarettes    Start date: 07/23/2013   Smokeless tobacco: Never  Substance and Sexual Activity   Alcohol use: No   Drug use: No   Sexual activity: Yes    Birth control/protection: Surgical  Other Topics Concern   Not on file  Social History Narrative   Not on file   Social Determinants of Health   Financial Resource Strain: Not on file  Food Insecurity: Not on file  Transportation Needs: Not on file  Physical Activity: Not on file  Stress: Not on file  Social Connections: Not on file  Intimate Partner  Violence: Not on file    Outpatient Medications Prior to Visit  Medication Sig Dispense Refill   albuterol (VENTOLIN HFA) 108 (90 Base) MCG/ACT inhaler Inhale 2 puffs into the lungs every 6 (six) hours as needed for wheezing or shortness of breath. 8 g 0   aspirin-acetaminophen-caffeine (EXCEDRIN MIGRAINE) [id number] MG tablet Take 2 tablets by mouth every 6 (six) hours as needed for headache.     buPROPion (WELLBUTRIN SR) 150 MG 12 hr tablet Take 1 tablet (150 mg total) by mouth daily. 30 tablet 0   cetirizine-pseudoephedrine (ZYRTEC-D) 5-120 MG tablet Take 1 tablet by mouth daily. 30 tablet 0   Cholecalciferol (VITAMIN D PO) Take by mouth.     EPINEPHrine 0.3 mg/0.3 mL IJ SOAJ injection   0   fexofenadine (ALLEGRA) 180 MG tablet Take 1 tablet (180 mg total) by mouth daily. 90 tablet 3   fluticasone (FLONASE) 50 MCG/ACT nasal spray Place 1 spray into both nostrils 2 (two) times daily. 16 g 2   Multiple Vitamin (MULTIVITAMIN) tablet Take 1 tablet by mouth daily.     neomycin-polymyxin-hydrocortisone (CORTISPORIN) OTIC solution Place 3 drops into the right ear 4 (  four) times daily. X 7 days 10 mL 0   nicotine (NICODERM CQ - DOSED IN MG/24 HOURS) 21 mg/24hr patch Place 1 patch (21 mg total) onto the skin daily. 14 patch 3   Probiotic Product (PROBIOTIC DAILY PO) Take by mouth.     Spacer/Aero-Holding Chambers DEVI 1 each by Does not apply route as needed. 1 each 0   SUMAtriptan (IMITREX) 50 MG tablet Take 1 tablet at onset of migraine. May repeat in 2 hours if headache persists or recurs. Max 2 per 24 hrs 10 tablet 2   No facility-administered medications prior to visit.    Allergies  Allergen Reactions   Ivp Dye [Iodinated Contrast Media] Shortness Of Breath and Rash   Sulfa Antibiotics Nausea And Vomiting   Codeine Other (See Comments)    "I'm Delusional"   Latex Hives   Morphine And Related Other (See Comments)    "bottoms out my blood pressure"   Topamax [Topiramate]     Hot  flashes, leg pain   Vicodin [Hydrocodone-Acetaminophen] Other (See Comments)    Hallucinations: "I see spiders"    ROS Review of Systems  Constitutional:  Negative for chills and fever.  Eyes:  Negative for visual disturbance.  Respiratory:  Negative for chest tightness and shortness of breath.   Genitourinary:  Negative for decreased urine volume, vaginal bleeding and vaginal pain.  Skin:        brusing  Neurological:  Negative for dizziness and headaches.      Objective:    Physical Exam HENT:     Head: Normocephalic.     Mouth/Throat:     Mouth: Mucous membranes are moist.  Cardiovascular:     Rate and Rhythm: Normal rate.     Heart sounds: Normal heart sounds.  Pulmonary:     Effort: Pulmonary effort is normal.     Breath sounds: Normal breath sounds.  Chest:     Comments: Breast lump in the left breast at 6 o'clock Genitourinary:    Exam position: Lithotomy position.     Tanner stage (genital): 5.     Comments: Vaginal wall: pink and rugated, smooth and non-tender; absence of lesions, edema, and erythema. Labia Majora and Minora: present bilaterally, moist, soft tissue, and homogeneous; free of edema and ulcerations. Clitoris is anatomically present, above the urethral, and free of lesions, masses, and ulceration.    Skin:    General: Skin is warm.     Comments: One 10 mm purplish patch located on her left inner thigh  Neurological:     Mental Status: She is alert.     BP 114/75   Pulse 87   Ht '5\' 9"'$  (1.753 m)   Wt 171 lb (77.6 kg)   SpO2 98%   BMI 25.25 kg/m  Wt Readings from Last 3 Encounters:  03/24/22 171 lb (77.6 kg)  02/17/22 172 lb 1.9 oz (78.1 kg)  02/10/21 170 lb 6.4 oz (77.3 kg)    Lab Results  Component Value Date   TSH 3.160 11/10/2020   Lab Results  Component Value Date   WBC 6.0 11/10/2020   HGB 14.4 11/10/2020   HCT 42.0 11/10/2020   MCV 93 11/10/2020   PLT 303 11/10/2020   Lab Results  Component Value Date   NA 138  11/10/2020   K 4.4 11/10/2020   CO2 21 11/10/2020   GLUCOSE 87 11/10/2020   BUN 8 11/10/2020   CREATININE 0.80 11/10/2020   BILITOT 0.9 06/15/2017   ALKPHOS  67 06/15/2017   AST 18 06/15/2017   ALT 14 06/15/2017   PROT 8.1 06/15/2017   ALBUMIN 4.7 06/15/2017   CALCIUM 9.9 11/10/2020   ANIONGAP 8 06/15/2017   EGFR 95 11/10/2020   Lab Results  Component Value Date   CHOL 193 11/10/2020   Lab Results  Component Value Date   HDL 50 11/10/2020   Lab Results  Component Value Date   LDLCALC 127 (H) 11/10/2020   Lab Results  Component Value Date   TRIG 90 11/10/2020   Lab Results  Component Value Date   CHOLHDL 3.9 11/10/2020   No results found for: "HGBA1C"    Assessment & Plan:  Pap smear for cervical cancer screening -     Cytology - PAP  Ecchymosis Assessment & Plan: She reports that she has been getting bruises on her inner follow-up The patient is on Wellbutrin Antidepressants can cause bruising The patient is not on a blood thinner Will  assess her CBC   IFG (impaired fasting glucose) -     Hemoglobin A1c  Vitamin D deficiency -     VITAMIN D 25 Hydroxy (Vit-D Deficiency, Fractures)  Other specified hypothyroidism -     TSH + free T4  Other hyperlipidemia -     Lipid panel -     CMP14+EGFR -     CBC with Differential/Platelet  Mass of left breast, unspecified quadrant -     US BREAST LTD UNI LEFT INC AXILLA  Breast cancer screening by mammogram -     MM DIAG BREAST TOMO BILATERAL    Follow-up: Return in about 3 months (around 06/22/2022).   Alvira Monday, FNP

## 2022-03-24 NOTE — Assessment & Plan Note (Signed)
Stable on Wellbutrin 150 mg daily Denies suicidal thoughts and ideation

## 2022-03-24 NOTE — Assessment & Plan Note (Signed)
She reports that she has been getting bruises on her inner follow-up The patient is on Wellbutrin Antidepressants can cause bruising The patient is not on a blood thinner Will  assess her CBC

## 2022-03-24 NOTE — Patient Instructions (Addendum)
I appreciate the opportunity to provide care to you today!    Follow up:  3 months  Labs: please stop by the lab during the week to get your blood drawn (CBC, CMP, TSH, Lipid profile, HgA1c, Vit D)  Screening: HIV and Hep C  Please Schedule Mammogram   You will be contacted on my chart with the results of your pap smear  Please continue to a heart-healthy diet and increase your physical activities. Try to exercise for 20mns at least five times a week.      It was a pleasure to see you and I look forward to continuing to work together on your health and well-being. Please do not hesitate to call the office if you need care or have questions about your care.   Have a wonderful day and week. With Gratitude, GAlvira MondayMSN, FNP-BC

## 2022-03-26 ENCOUNTER — Other Ambulatory Visit (HOSPITAL_COMMUNITY): Payer: Self-pay

## 2022-03-29 LAB — CYTOLOGY - PAP
Chlamydia: NEGATIVE
Comment: NEGATIVE
Comment: NEGATIVE
Comment: NEGATIVE
Comment: NORMAL
Diagnosis: UNDETERMINED — AB
High risk HPV: NEGATIVE
Neisseria Gonorrhea: NEGATIVE
Trichomonas: NEGATIVE

## 2022-03-29 NOTE — Progress Notes (Signed)
Please inform the patient that her Pap smear hide atypical squamous cells of undetermined significance but HPV was negative.  Will follow-up on her Pap smear in 3 years

## 2022-03-31 DIAGNOSIS — R7301 Impaired fasting glucose: Secondary | ICD-10-CM | POA: Diagnosis not present

## 2022-03-31 DIAGNOSIS — E559 Vitamin D deficiency, unspecified: Secondary | ICD-10-CM | POA: Diagnosis not present

## 2022-03-31 DIAGNOSIS — E7849 Other hyperlipidemia: Secondary | ICD-10-CM | POA: Diagnosis not present

## 2022-03-31 DIAGNOSIS — E038 Other specified hypothyroidism: Secondary | ICD-10-CM | POA: Diagnosis not present

## 2022-04-01 LAB — CBC WITH DIFFERENTIAL/PLATELET
Basophils Absolute: 0.1 10*3/uL (ref 0.0–0.2)
Basos: 1 %
EOS (ABSOLUTE): 0.2 10*3/uL (ref 0.0–0.4)
Eos: 3 %
Hematocrit: 40 % (ref 34.0–46.6)
Hemoglobin: 13.5 g/dL (ref 11.1–15.9)
Immature Grans (Abs): 0 10*3/uL (ref 0.0–0.1)
Immature Granulocytes: 0 %
Lymphocytes Absolute: 1.5 10*3/uL (ref 0.7–3.1)
Lymphs: 28 %
MCH: 31.3 pg (ref 26.6–33.0)
MCHC: 33.8 g/dL (ref 31.5–35.7)
MCV: 93 fL (ref 79–97)
Monocytes Absolute: 0.5 10*3/uL (ref 0.1–0.9)
Monocytes: 9 %
Neutrophils Absolute: 3 10*3/uL (ref 1.4–7.0)
Neutrophils: 59 %
Platelets: 282 10*3/uL (ref 150–450)
RBC: 4.32 x10E6/uL (ref 3.77–5.28)
RDW: 12.2 % (ref 11.7–15.4)
WBC: 5.2 10*3/uL (ref 3.4–10.8)

## 2022-04-01 LAB — CMP14+EGFR
ALT: 13 IU/L (ref 0–32)
AST: 14 IU/L (ref 0–40)
Albumin/Globulin Ratio: 2.1 (ref 1.2–2.2)
Albumin: 4.4 g/dL (ref 3.9–4.9)
Alkaline Phosphatase: 58 IU/L (ref 44–121)
BUN/Creatinine Ratio: 9 (ref 9–23)
BUN: 7 mg/dL (ref 6–24)
Bilirubin Total: 0.3 mg/dL (ref 0.0–1.2)
CO2: 23 mmol/L (ref 20–29)
Calcium: 9 mg/dL (ref 8.7–10.2)
Chloride: 102 mmol/L (ref 96–106)
Creatinine, Ser: 0.79 mg/dL (ref 0.57–1.00)
Globulin, Total: 2.1 g/dL (ref 1.5–4.5)
Glucose: 86 mg/dL (ref 70–99)
Potassium: 4.2 mmol/L (ref 3.5–5.2)
Sodium: 138 mmol/L (ref 134–144)
Total Protein: 6.5 g/dL (ref 6.0–8.5)
eGFR: 96 mL/min/{1.73_m2} (ref >59)

## 2022-04-01 LAB — LIPID PANEL
Chol/HDL Ratio: 3.7 ratio (ref 0.0–4.4)
Cholesterol, Total: 176 mg/dL (ref 100–199)
HDL: 48 mg/dL (ref >39)
LDL Chol Calc (NIH): 114 mg/dL — ABNORMAL HIGH (ref 0–99)
Triglycerides: 72 mg/dL (ref 0–149)
VLDL Cholesterol Cal: 14 mg/dL (ref 5–40)

## 2022-04-01 LAB — TSH+FREE T4
Free T4: 1.31 ng/dL (ref 0.82–1.77)
TSH: 2.94 u[IU]/mL (ref 0.450–4.500)

## 2022-04-01 LAB — HEMOGLOBIN A1C
Est. average glucose Bld gHb Est-mCnc: 97 mg/dL
Hgb A1c MFr Bld: 5 % (ref 4.8–5.6)

## 2022-04-01 LAB — VITAMIN D 25 HYDROXY (VIT D DEFICIENCY, FRACTURES): Vit D, 25-Hydroxy: 31.7 ng/mL (ref 30.0–100.0)

## 2022-04-14 ENCOUNTER — Other Ambulatory Visit: Payer: Self-pay

## 2022-04-14 ENCOUNTER — Other Ambulatory Visit (HOSPITAL_COMMUNITY): Payer: Self-pay

## 2022-04-14 ENCOUNTER — Other Ambulatory Visit: Payer: Self-pay | Admitting: Family Medicine

## 2022-04-14 DIAGNOSIS — F339 Major depressive disorder, recurrent, unspecified: Secondary | ICD-10-CM

## 2022-04-14 MED ORDER — BUPROPION HCL ER (SR) 150 MG PO TB12
150.0000 mg | ORAL_TABLET | Freq: Every day | ORAL | 0 refills | Status: DC
  Filled 2022-04-14: qty 30, 30d supply, fill #0

## 2022-04-15 ENCOUNTER — Encounter (HOSPITAL_COMMUNITY): Payer: Self-pay

## 2022-04-15 ENCOUNTER — Ambulatory Visit (HOSPITAL_COMMUNITY)
Admission: RE | Admit: 2022-04-15 | Discharge: 2022-04-15 | Disposition: A | Discharge status: Home / Self Care | Payer: 59 | Source: Ambulatory Visit | Attending: Family Medicine | Admitting: Family Medicine

## 2022-04-15 DIAGNOSIS — N6452 Nipple discharge: Secondary | ICD-10-CM | POA: Diagnosis not present

## 2022-04-15 DIAGNOSIS — Z1231 Encounter for screening mammogram for malignant neoplasm of breast: Secondary | ICD-10-CM | POA: Insufficient documentation

## 2022-04-15 DIAGNOSIS — Z803 Family history of malignant neoplasm of breast: Secondary | ICD-10-CM | POA: Diagnosis not present

## 2022-04-15 DIAGNOSIS — N6320 Unspecified lump in the left breast, unspecified quadrant: Secondary | ICD-10-CM | POA: Insufficient documentation

## 2022-04-20 ENCOUNTER — Ambulatory Visit: Payer: 59 | Admitting: Family Medicine

## 2022-04-29 ENCOUNTER — Other Ambulatory Visit: Payer: Self-pay

## 2022-04-30 ENCOUNTER — Other Ambulatory Visit (HOSPITAL_COMMUNITY): Payer: Self-pay

## 2022-05-12 ENCOUNTER — Other Ambulatory Visit: Payer: Self-pay | Admitting: Family Medicine

## 2022-05-12 ENCOUNTER — Other Ambulatory Visit: Payer: Self-pay

## 2022-05-12 DIAGNOSIS — F339 Major depressive disorder, recurrent, unspecified: Secondary | ICD-10-CM

## 2022-05-12 MED ORDER — BUPROPION HCL ER (SR) 150 MG PO TB12
150.0000 mg | ORAL_TABLET | Freq: Every day | ORAL | 0 refills | Status: DC
  Filled 2022-05-12: qty 30, 30d supply, fill #0

## 2022-05-14 ENCOUNTER — Other Ambulatory Visit (HOSPITAL_COMMUNITY): Payer: Self-pay

## 2022-06-07 ENCOUNTER — Other Ambulatory Visit: Payer: Self-pay

## 2022-06-07 ENCOUNTER — Other Ambulatory Visit: Payer: Self-pay | Admitting: Family Medicine

## 2022-06-07 DIAGNOSIS — F339 Major depressive disorder, recurrent, unspecified: Secondary | ICD-10-CM

## 2022-06-07 MED ORDER — BUPROPION HCL ER (SR) 150 MG PO TB12
150.0000 mg | ORAL_TABLET | Freq: Every day | ORAL | 0 refills | Status: DC
  Filled 2022-06-07: qty 30, 30d supply, fill #0

## 2022-06-18 ENCOUNTER — Telehealth: Payer: 59 | Admitting: Physician Assistant

## 2022-06-18 DIAGNOSIS — K047 Periapical abscess without sinus: Secondary | ICD-10-CM

## 2022-06-18 MED ORDER — IBUPROFEN 600 MG PO TABS: 600.0000 mg | ORAL_TABLET | Freq: Three times a day (TID) | ORAL | 0 refills | Status: AC | PRN

## 2022-06-18 MED ORDER — AMOXICILLIN-POT CLAVULANATE 875-125 MG PO TABS: 1.0000 | ORAL_TABLET | Freq: Two times a day (BID) | ORAL | 0 refills | Status: DC

## 2022-06-18 NOTE — Progress Notes (Signed)
E-Visit for Dental Pain  We are sorry that you are not feeling well.  Here is how we plan to help!  Based on what you have shared with me in the questionnaire, it sounds like you have a dental infection.  Augmentin 875-125mg twice a day for 7 days and Ibuprofen 600mg 3 times a day for 7 days for discomfort  It is imperative that you see a dentist within 10 days of this eVisit to determine the cause of the dental pain and be sure it is adequately treated  A toothache or tooth pain is caused when the nerve in the root of a tooth or surrounding a tooth is irritated. Dental (tooth) infection, decay, injury, or loss of a tooth are the most common causes of dental pain. Pain may also occur after an extraction (tooth is pulled out). Pain sometimes originates from other areas and radiates to the jaw, thus appearing to be tooth pain.Bacteria growing inside your mouth can contribute to gum disease and dental decay, both of which can cause pain. A toothache occurs from inflammation of the central portion of the tooth called pulp. The pulp contains nerve endings that are very sensitive to pain. Inflammation to the pulp or pulpitis may be caused by dental cavities, trauma, and infection.    HOME CARE:   For toothaches: Over-the-counter pain medications such as acetaminophen or ibuprofen may be used. Take these as directed on the package while you arrange for a dental appointment. Avoid very cold or hot foods, because they may make the pain worse. You may get relief from biting on a cotton ball soaked in oil of cloves. You can get oil of cloves at most drug stores.  For jaw pain:  Aspirin may be helpful for problems in the joint of the jaw in adults. If pain happens every time you open your mouth widely, the temporomandibular joint (TMJ) may be the source of the pain. Yawning or taking a large bite of food may worsen the pain. An appointment with your doctor or dentist will help you find the cause.      GET HELP RIGHT AWAY IF:  You have a high fever or chills If you have had a recent head or face injury and develop headache, light headedness, nausea, vomiting, or other symptoms that concern you after an injury to your face or mouth, you could have a more serious injury in addition to your dental injury. A facial rash associated with a toothache: This condition may improve with medication. Contact your doctor for them to decide what is appropriate. Any jaw pain occurring with chest pain: Although jaw pain is most commonly caused by dental disease, it is sometimes referred pain from other areas. People with heart disease, especially people who have had stents placed, people with diabetes, or those who have had heart surgery may have jaw pain as a symptom of heart attack or angina. If your jaw or tooth pain is associated with lightheadedness, sweating, or shortness of breath, you should see a doctor as soon as possible. Trouble swallowing or excessive pain or bleeding from gums: If you have a history of a weakened immune system, diabetes, or steroid use, you may be more susceptible to infections. Infections can often be more severe and extensive or caused by unusual organisms. Dental and gum infections in people with these conditions may require more aggressive treatment. An abscess may need draining or IV antibiotics, for example.  MAKE SURE YOU   Understand these instructions.   Will watch your condition. Will get help right away if you are not doing well or get worse.  Thank you for choosing an e-visit.  Your e-visit answers were reviewed by a board certified advanced clinical practitioner to complete your personal care plan. Depending upon the condition, your plan could have included both over the counter or prescription medications.  Please review your pharmacy choice. Make sure the pharmacy is open so you can pick up prescription now. If there is a problem, you may contact your provider  through MyChart messaging and have the prescription routed to another pharmacy.  Your safety is important to us. If you have drug allergies check your prescription carefully.   For the next 24 hours you can use MyChart to ask questions about today's visit, request a non-urgent call back, or ask for a work or school excuse. You will get an email in the next two days asking about your experience. I hope that your e-visit has been valuable and will speed your recovery.  I have spent 5 minutes in review of e-visit questionnaire, review and updating patient chart, medical decision making and response to patient.   Muskaan Smet M Burch [place], PA-C  

## 2022-06-22 ENCOUNTER — Encounter: Payer: Self-pay | Admitting: Family Medicine

## 2022-06-22 ENCOUNTER — Ambulatory Visit: Payer: 59 | Admitting: Family Medicine

## 2022-06-22 ENCOUNTER — Other Ambulatory Visit (HOSPITAL_COMMUNITY): Payer: Self-pay

## 2022-06-22 VITALS — BP 118/78 | HR 100 | Ht 69.0 in | Wt 177.0 lb

## 2022-06-22 DIAGNOSIS — F419 Anxiety disorder, unspecified: Secondary | ICD-10-CM | POA: Diagnosis not present

## 2022-06-22 DIAGNOSIS — F339 Major depressive disorder, recurrent, unspecified: Secondary | ICD-10-CM

## 2022-06-22 DIAGNOSIS — G43109 Migraine with aura, not intractable, without status migrainosus: Secondary | ICD-10-CM

## 2022-06-22 DIAGNOSIS — G4700 Insomnia, unspecified: Secondary | ICD-10-CM | POA: Diagnosis not present

## 2022-06-22 MED ORDER — SUMATRIPTAN SUCCINATE 50 MG PO TABS
ORAL_TABLET | ORAL | 2 refills | Status: DC
Start: 2022-06-22 — End: 2022-08-23
  Filled 2022-06-22: qty 10, 30d supply, fill #0

## 2022-06-22 MED ORDER — HYDROXYZINE PAMOATE 25 MG PO CAPS: 25.0000 mg | ORAL_CAPSULE | Freq: Three times a day (TID) | ORAL | 0 refills | Status: DC | PRN

## 2022-06-22 MED ORDER — HYDROXYZINE PAMOATE 25 MG PO CAPS
25.0000 mg | ORAL_CAPSULE | Freq: Three times a day (TID) | ORAL | 0 refills | Status: DC | PRN
  Filled 2022-06-22: qty 30, 10d supply, fill #0

## 2022-06-22 MED ORDER — BUPROPION HCL ER (SR) 150 MG PO TB12
150.0000 mg | ORAL_TABLET | Freq: Every day | ORAL | 2 refills | Status: DC
Start: 2022-06-22 — End: 2022-08-23
  Filled 2022-06-22 – 2022-08-06 (×2): qty 30, 30d supply, fill #0

## 2022-06-22 NOTE — Progress Notes (Signed)
Established Patient Office Visit  Subjective:  Patient ID: Lisa Fields, female    DOB: Dec 04, 1980  Age: 42 y.o. MRN: [id number]  CC:  Chief Complaint  Patient presents with   Chronic Care Management    3 months f/u, pt reports trouble sleeping since 04/30/2022. Unable to sit still.     HPI Lisa Fields is a 42 y.o. female with past medical history of anxiety and depression, insomnia, and fatigue presents for f/u of  chronic medical conditions. For the details of today's visit, please refer to the assessment and plan.     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  April 2019   Bronchitis    Cervical lymphadenitis 11/10/2015   Chronic fatigue 11/10/2015   Chronic headaches    Fatigue 11/10/2015   Fibromyalgia 11/10/2015   Myalgia 11/10/2015   Ovarian cyst    Smoker 11/10/2015    Past Surgical History:  Procedure Laterality Date   APPENDECTOMY     BREAST BIOPSY Left 11/24/2017   fibrocystic change benign   OVARIAN CYST SURGERY     TUBAL LIGATION      Family History  Problem Relation Age of Onset   Breast cancer Mother    Breast cancer Sister    Asthma Sister    Breast cancer Maternal Grandmother    Asthma Brother    Allergic rhinitis Neg Hx    Eczema Neg Hx    Immunodeficiency Neg Hx    Urticaria Neg Hx    Angioedema Neg Hx     Social History   Socioeconomic History   Marital status: Significant Other    Spouse name: Not on file   Number of children: Not on file   Years of education: Not on file   Highest education level: Not on file  Occupational History   Not on file  Tobacco Use   Smoking status: Every Day    Packs/day: .5    Types: Cigarettes    Start date: 07/23/2013   Smokeless tobacco: Never  Substance and Sexual Activity   Alcohol use: No   Drug use: No   Sexual activity: Yes    Birth control/protection: Surgical  Other Topics Concern    Not on file  Social History Narrative   Not on file   Social Determinants of Health   Financial Resource Strain: Not on file  Food Insecurity: Not on file  Transportation Needs: Not on file  Physical Activity: Not on file  Stress: Not on file  Social Connections: Not on file  Intimate Partner Violence: Not on file    Outpatient Medications Prior to Visit  Medication Sig Dispense Refill   albuterol (VENTOLIN HFA) 108 (90 Base) MCG/ACT inhaler Inhale 2 puffs into the lungs every 6 (six) hours as needed for wheezing or shortness of breath. 8 g 0   amoxicillin-clavulanate (AUGMENTIN) 875-125 MG tablet Take 1 tablet by mouth 2 (two) times daily. 14 tablet 0   aspirin-acetaminophen-caffeine (EXCEDRIN MIGRAINE) 250-250-65 MG tablet Take 2 tablets by mouth every 6 (six) hours as needed for headache.     cetirizine-pseudoephedrine (ZYRTEC-D) 5-120 MG tablet Take 1 tablet by mouth daily. 30 tablet 0   Cholecalciferol (VITAMIN D PO) Take by mouth.     EPINEPHrine 0.3 mg/0.3 mL IJ SOAJ injection   0   fexofenadine (ALLEGRA) 180 MG tablet Take 1 tablet (180 mg total)  by mouth daily. 90 tablet 3   fluticasone (FLONASE) 50 MCG/ACT nasal spray Place 1 spray into both nostrils 2 (two) times daily. 16 g 2   ibuprofen (ADVIL) 600 MG tablet Take 1 tablet (600 mg total) by mouth every 8 (eight) hours as needed. 30 tablet 0   Multiple Vitamin (MULTIVITAMIN) tablet Take 1 tablet by mouth daily.     neomycin-polymyxin-hydrocortisone (CORTISPORIN) OTIC solution Place 3 drops into the right ear 4 (four) times daily. X 7 days 10 mL 0   nicotine (NICODERM CQ - DOSED IN MG/24 HOURS) 21 mg/24hr patch Place 1 patch (21 mg total) onto the skin daily. 14 patch 3   Probiotic Product (PROBIOTIC DAILY PO) Take by mouth.     Spacer/Aero-Holding Chambers DEVI 1 each by Does not apply route as needed. 1 each 0   buPROPion (WELLBUTRIN SR) 150 MG 12 hr tablet Take 1 tablet (150 mg total) by mouth daily. 30 tablet 0    SUMAtriptan (IMITREX) 50 MG tablet Take 1 tablet at onset of migraine. May repeat in 2 hours if headache persists or recurs. Max 2 per 24 hrs 10 tablet 2   No facility-administered medications prior to visit.    Allergies  Allergen Reactions   Ivp Dye [Iodinated Contrast Media] Shortness Of Breath and Rash   Sulfa Antibiotics Nausea And Vomiting   Codeine Other (See Comments)    "I'm Delusional"   Latex Hives   Morphine And Codeine Other (See Comments)    "bottoms out my blood pressure"   Topamax [Topiramate]     Hot flashes, leg pain   Vicodin [Hydrocodone-Acetaminophen] Other (See Comments)    Hallucinations: "I see spiders"    ROS Review of Systems  Constitutional:  Negative for chills and fever.  Eyes:  Negative for visual disturbance.  Respiratory:  Negative for chest tightness and shortness of breath.   Neurological:  Negative for dizziness and headaches.  Psychiatric/Behavioral:  Positive for sleep disturbance.       Objective:    Physical Exam HENT:     Head: Normocephalic.     Mouth/Throat:     Mouth: Mucous membranes are moist.  Cardiovascular:     Rate and Rhythm: Normal rate.     Heart sounds: Normal heart sounds.  Pulmonary:     Effort: Pulmonary effort is normal.     Breath sounds: Normal breath sounds.  Neurological:     Mental Status: She is alert.     BP 118/78   Pulse 100   Ht 5\' 9"  (1.753 m)   Wt 177 lb 0.6 oz (80.3 kg)   SpO2 97%   BMI 26.14 kg/m  Wt Readings from Last 3 Encounters:  06/22/22 177 lb 0.6 oz (80.3 kg)  03/24/22 171 lb (77.6 kg)  02/17/22 172 lb 1.9 oz (78.1 kg)    Lab Results  Component Value Date   TSH 2.940 03/31/2022   Lab Results  Component Value Date   WBC 5.2 03/31/2022   HGB 13.5 03/31/2022   HCT 40.0 03/31/2022   MCV 93 03/31/2022   PLT 282 03/31/2022   Lab Results  Component Value Date   NA 138 03/31/2022   K 4.2 03/31/2022   CO2 23 03/31/2022   GLUCOSE 86 03/31/2022   BUN 7 03/31/2022    CREATININE 0.79 03/31/2022   BILITOT 0.3 03/31/2022   ALKPHOS 58 03/31/2022   AST 14 03/31/2022   ALT 13 03/31/2022   PROT 6.5 03/31/2022   ALBUMIN  4.4 03/31/2022   CALCIUM 9.0 03/31/2022   ANIONGAP 8 06/15/2017   EGFR 96 03/31/2022   Lab Results  Component Value Date   CHOL 176 03/31/2022   Lab Results  Component Value Date   HDL 48 03/31/2022   Lab Results  Component Value Date   LDLCALC 114 (H) 03/31/2022   Lab Results  Component Value Date   TRIG 72 03/31/2022   Lab Results  Component Value Date   CHOLHDL 3.7 03/31/2022   Lab Results  Component Value Date   HGBA1C 5.0 03/31/2022      Assessment & Plan:  Insomnia, unspecified type Assessment & Plan: Reports getting 5 hours of sleep nightly due to increased life stresses She was taking trazodone 100 mg nightly with minimal relief Reports trying melatonin with minimal relief Will start trial of hydroxyzine 25 mg to take for 2 weeks and can increase the dose to 50 mg nightly  Orders: -     hydrOXYzine Pamoate; Take 1 capsule (25 mg total) by mouth every 8 (eight) hours as needed.  Dispense: 30 capsule; Refill: 0  Anxiety and depression Assessment & Plan: PHQ-9 is 11 GAD-7 is 12 Stable on Wellbutrin 150 mg daily Complains of increased anxiety and depression Denies suicidal ideation and thoughts     Depression, recurrent (HCC) -     buPROPion HCl ER (SR); Take 1 tablet (150 mg total) by mouth daily.  Dispense: 30 tablet; Refill: 2  Migraine with aura and without status migrainosus, not intractable -     SUMAtriptan Succinate; Take 1 tablet at onset of migraine. May repeat in 2 hours if headache persists or recurs. Max 2 tablets per 24 hours  Dispense: 10 tablet; Refill: 2    Follow-up: Return in about 1 month (around 07/23/2022).   [name], FNP

## 2022-06-22 NOTE — Assessment & Plan Note (Signed)
Reports getting 5 hours of sleep nightly due to increased life stresses She was taking trazodone 100 mg nightly with minimal relief Reports trying melatonin with minimal relief Will start trial of hydroxyzine 25 mg to take for 2 weeks and can increase the dose to 50 mg nightly

## 2022-06-22 NOTE — Patient Instructions (Addendum)
I appreciate the opportunity to provide care to you today!    Follow up:  1 month  Labs: next visit  Sleep disturbance   Hydroxyzine 25 mg at bedtime for 2 weeks and, if needed, can increase the dose to 50 mg at bedtime after 2 weeks.   Please continue to a heart-healthy diet and increase your physical activities. Try to exercise for at least five days a week.      It was a pleasure to see you and I look forward to continuing to work together on your health and well-being. Please do not hesitate to call the office if you need care or have questions about your care.   Have a wonderful day and week. With Gratitude, [name] MSN, FNP-BC

## 2022-06-22 NOTE — Assessment & Plan Note (Signed)
PHQ-9 is 11 GAD-7 is 12 Stable on Wellbutrin 150 mg daily Complains of increased anxiety and depression Denies suicidal ideation and thoughts

## 2022-07-06 ENCOUNTER — Other Ambulatory Visit (HOSPITAL_COMMUNITY): Payer: Self-pay

## 2022-07-21 ENCOUNTER — Ambulatory Visit: Payer: 59 | Admitting: Family Medicine

## 2022-08-06 ENCOUNTER — Other Ambulatory Visit (HOSPITAL_COMMUNITY): Payer: Self-pay

## 2022-08-06 ENCOUNTER — Other Ambulatory Visit: Payer: Self-pay | Admitting: Family Medicine

## 2022-08-06 ENCOUNTER — Other Ambulatory Visit: Payer: Self-pay

## 2022-08-06 DIAGNOSIS — G4700 Insomnia, unspecified: Secondary | ICD-10-CM

## 2022-08-06 MED ORDER — HYDROXYZINE PAMOATE 25 MG PO CAPS
25.0000 mg | ORAL_CAPSULE | Freq: Three times a day (TID) | ORAL | 0 refills | Status: DC | PRN
Start: 2022-08-06 — End: 2023-06-30
  Filled 2022-08-06 (×2): qty 30, 10d supply, fill #0

## 2022-08-16 ENCOUNTER — Telehealth: Payer: Self-pay | Admitting: Family Medicine

## 2022-08-16 NOTE — Telephone Encounter (Signed)
FMLA   Copied Noted Sleeved (put in providers box)

## 2022-08-23 ENCOUNTER — Encounter: Payer: Self-pay | Admitting: Family Medicine

## 2022-08-23 ENCOUNTER — Other Ambulatory Visit (HOSPITAL_COMMUNITY): Payer: Self-pay

## 2022-08-23 ENCOUNTER — Telehealth: Payer: 59 | Admitting: Family Medicine

## 2022-08-23 ENCOUNTER — Ambulatory Visit: Payer: 59 | Admitting: Family Medicine

## 2022-08-23 DIAGNOSIS — F419 Anxiety disorder, unspecified: Secondary | ICD-10-CM

## 2022-08-23 DIAGNOSIS — F32A Depression, unspecified: Secondary | ICD-10-CM

## 2022-08-23 DIAGNOSIS — R110 Nausea: Secondary | ICD-10-CM | POA: Diagnosis not present

## 2022-08-23 DIAGNOSIS — G43109 Migraine with aura, not intractable, without status migrainosus: Secondary | ICD-10-CM

## 2022-08-23 MED ORDER — SUMATRIPTAN SUCCINATE 100 MG PO TABS
100.0000 mg | ORAL_TABLET | ORAL | 0 refills | Status: DC | PRN
Start: 2022-08-23 — End: 2023-09-30
  Filled 2022-08-23: qty 10, 5d supply, fill #0
  Filled 2022-08-24: qty 9, 15d supply, fill #0

## 2022-08-23 MED ORDER — NURTEC 75 MG PO TBDP
75.0000 mg | ORAL_TABLET | ORAL | 1 refills | Status: DC
Start: 2022-08-23 — End: 2022-10-04
  Filled 2022-08-23 – 2022-08-24 (×2): qty 15, 30d supply, fill #0

## 2022-08-23 MED ORDER — ONDANSETRON HCL 4 MG PO TABS
4.0000 mg | ORAL_TABLET | Freq: Three times a day (TID) | ORAL | 0 refills | Status: DC | PRN
  Filled 2022-08-23 – 2022-08-24 (×2): qty 20, 7d supply, fill #0

## 2022-08-23 MED ORDER — BUPROPION HCL ER (SR) 200 MG PO TB12
200.0000 mg | ORAL_TABLET | Freq: Every day | ORAL | 1 refills | Status: DC
Start: 2022-08-23 — End: 2023-02-14
  Filled 2022-08-23 – 2022-08-24 (×2): qty 90, 90d supply, fill #0
  Filled 2022-11-18: qty 90, 90d supply, fill #1

## 2022-08-23 NOTE — Progress Notes (Signed)
Virtual Visit via Video Note  I connected with Lisa Fields on 08/27/22 at  4:00 PM EDT by a video enabled telemedicine application and verified that I am speaking with the correct person using two identifiers.  Patient Location: Home Provider Location: Office/Clinic  I discussed the limitations, risks, security, and privacy concerns of performing an evaluation and management service by video and the availability of in person appointments. I also discussed with the patient that there may be a patient responsible charge related to this service. The patient expressed understanding and agreed to proceed.  Subjective: PCP: Lisa Laroche, FNP  Chief Complaint  Patient presents with   Care Management    Follow up, wants to up the anxiety medication, has been more anxious than usual. Also reports migraines have increased.   HPI GAD 7- is 20 PHQ-9 is 9 She takes Wellbutrin 150 mg daily and reports minimum relief with current treatment regimen. Denies suicidal thoughts and ideation C/o worsening of her migraines with minimum relief with current treatment regimen.  ROS: Per HPI  Current Outpatient Medications:    albuterol (VENTOLIN HFA) 108 (90 Base) MCG/ACT inhaler, Inhale 2 puffs into the lungs every 6 (six) hours as needed for wheezing or shortness of breath., Disp: 8 g, Rfl: 0   amoxicillin-clavulanate (AUGMENTIN) 875-125 MG tablet, Take 1 tablet by mouth 2 (two) times daily., Disp: 14 tablet, Rfl: 0   aspirin-acetaminophen-caffeine (EXCEDRIN MIGRAINE) 250-250-65 MG tablet, Take 2 tablets by mouth every 6 (six) hours as needed for headache., Disp: , Rfl:    buPROPion (WELLBUTRIN SR) 200 MG 12 hr tablet, Take 1 tablet (200 mg total) by mouth daily., Disp: 90 tablet, Rfl: 1   cetirizine-pseudoephedrine (ZYRTEC-D) 5-120 MG tablet, Take 1 tablet by mouth daily., Disp: 30 tablet, Rfl: 0   Cholecalciferol (VITAMIN D PO), Take by mouth., Disp: , Rfl:    EPINEPHrine 0.3 mg/0.3 mL IJ  SOAJ injection, , Disp: , Rfl: 0   fexofenadine (ALLEGRA) 180 MG tablet, Take 1 tablet (180 mg total) by mouth daily., Disp: 90 tablet, Rfl: 3   fluticasone (FLONASE) 50 MCG/ACT nasal spray, Place 1 spray into both nostrils 2 (two) times daily., Disp: 16 g, Rfl: 2   hydrOXYzine (VISTARIL) 25 MG capsule, Take 1 capsule (25 mg total) by mouth every 8 (eight) hours as needed., Disp: 30 capsule, Rfl: 0   ibuprofen (ADVIL) 600 MG tablet, Take 1 tablet (600 mg total) by mouth every 8 (eight) hours as needed., Disp: 30 tablet, Rfl: 0   Multiple Vitamin (MULTIVITAMIN) tablet, Take 1 tablet by mouth daily., Disp: , Rfl:    neomycin-polymyxin-hydrocortisone (CORTISPORIN) OTIC solution, Place 3 drops into the right ear 4 (four) times daily. X 7 days, Disp: 10 mL, Rfl: 0   nicotine (NICODERM CQ - DOSED IN MG/24 HOURS) 21 mg/24hr patch, Place 1 patch (21 mg total) onto the skin daily., Disp: 14 patch, Rfl: 3   ondansetron (ZOFRAN) 4 MG tablet, Take 1 tablet (4 mg total) by mouth every 8 (eight) hours as needed for nausea or vomiting., Disp: 20 tablet, Rfl: 0   Probiotic Product (PROBIOTIC DAILY PO), Take by mouth., Disp: , Rfl:    Rimegepant Sulfate (NURTEC) 75 MG TBDP, Take 1 tablet (75 mg total) by mouth every other day., Disp: 30 tablet, Rfl: 1   Spacer/Aero-Holding Chambers DEVI, 1 each by Does not apply route as needed., Disp: 1 each, Rfl: 0   SUMAtriptan (IMITREX) 100 MG tablet, Take 1 tablet by mouth  every 2  hours as needed for migraine. May repeat in 2 hours if headache persists or recurs.Wait at least 2 hours between doses (max 2tabs/24 hours), Disp: 10 tablet, Rfl: 0  Observations/Objective: There were no vitals filed for this visit. Physical Exam  Assessment and Plan: Migraine with aura and without status migrainosus, not intractable -     SUMAtriptan Succinate; Take 1 tablet by mouth every 2  hours as needed for migraine. May repeat in 2 hours if headache persists or recurs.Wait at least 2 hours  between doses (max 2tabs/24 hours)  Dispense: 10 tablet; Refill: 0 -     Nurtec; Take 1 tablet (75 mg total) by mouth every other day.  Dispense: 30 tablet; Refill: 1 -     Ambulatory referral to Neurology  Nausea -     Ondansetron HCl; Take 1 tablet (4 mg total) by mouth every 8 (eight) hours as needed for nausea or vomiting.  Dispense: 20 tablet; Refill: 0  Anxiety and depression -     buPROPion HCl ER (SR); Take 1 tablet (200 mg total) by mouth daily.  Dispense: 90 tablet; Refill: 1    Follow Up Instructions: Return in about 1 month (around 09/23/2022).   I discussed the assessment and treatment plan with the patient. The patient was provided an opportunity to ask questions, and all were answered. The patient agreed with the plan and demonstrated an understanding of the instructions.   The patient was advised to call back or seek an in-person evaluation if the symptoms worsen or if the condition fails to improve as anticipated.  The above assessment and management plan was discussed with the patient. The patient verbalized understanding of and has agreed to the management plan.   Lisa Laroche, FNP

## 2022-08-24 ENCOUNTER — Other Ambulatory Visit (HOSPITAL_COMMUNITY): Payer: Self-pay

## 2022-08-24 ENCOUNTER — Other Ambulatory Visit: Payer: Self-pay

## 2022-08-26 ENCOUNTER — Telehealth: Payer: Self-pay | Admitting: Family Medicine

## 2022-08-26 NOTE — Telephone Encounter (Signed)
FMLA  Copied Noted Sleeved (put in provider box)  When completed to be fax to [phone]

## 2022-08-30 ENCOUNTER — Encounter: Payer: Self-pay | Admitting: Neurology

## 2022-08-31 ENCOUNTER — Telehealth: Payer: Self-pay | Admitting: Family Medicine

## 2022-08-31 NOTE — Telephone Encounter (Signed)
Per patient ignore this fmla same forms as done on 07.22.2024

## 2022-08-31 NOTE — Telephone Encounter (Signed)
Patient calling says she has an ear infection- the ENT she was referred to does not take her insurance wanting to know if another one can be sent elsewhere. Please advise Thank you

## 2022-08-31 NOTE — Telephone Encounter (Signed)
Patient was called to come by pick up forms. Patient picked up forms 08/31/22 @ 3:54 pm.

## 2022-09-01 ENCOUNTER — Other Ambulatory Visit: Payer: Self-pay | Admitting: Family Medicine

## 2022-09-01 ENCOUNTER — Other Ambulatory Visit: Payer: Self-pay

## 2022-09-01 ENCOUNTER — Encounter: Payer: Self-pay | Admitting: Emergency Medicine

## 2022-09-01 ENCOUNTER — Ambulatory Visit
Admission: EM | Admit: 2022-09-01 | Discharge: 2022-09-01 | Disposition: A | Discharge status: Home / Self Care | Payer: 59 | Attending: Nurse Practitioner | Admitting: Nurse Practitioner

## 2022-09-01 DIAGNOSIS — Z0279 Encounter for issue of other medical certificate: Secondary | ICD-10-CM

## 2022-09-01 DIAGNOSIS — H60391 Other infective otitis externa, right ear: Secondary | ICD-10-CM

## 2022-09-01 MED ORDER — CIPROFLOXACIN HCL 500 MG PO TABS: 500.0000 mg | ORAL_TABLET | Freq: Two times a day (BID) | ORAL | 0 refills | Status: AC

## 2022-09-01 NOTE — Discharge Instructions (Addendum)
Take medication as prescribed. Continue over-the-counter Tylenol or ibuprofen as needed for pain, fever, or general discomfort. Warm compresses to the right ear to help with pain or discomfort. Continuing to avoid entrance of water inside of the ear while symptoms persist. Continue to follow-up with your PCP office regarding referral to ENT.  Follow-up with your primary care physician if symptoms fail to improve. Follow-up as needed.

## 2022-09-01 NOTE — ED Triage Notes (Addendum)
Pt reports right ear itching that has now turned into "shooting" jaw pains, intermittent dizziness for last several weeks. Pt reports left ear is also starting to itch. Denies any known injury. Reports has tried leftover neomycin drops with no improvement of symptoms.  Recent covid test at work was negative.

## 2022-09-01 NOTE — ED Provider Notes (Signed)
[facility]    CSN: [id number] Arrival date & time: 09/01/22  1448      History   Chief Complaint Chief Complaint  Patient presents with   Ear Pain    HPI Lisa Fields is a 42 y.o. female.   The history is provided by the patient.   The patient presents for complaints of  sharp stabbing right ear pain, dizzy spells, and right-sided jaw pain.  Patient states symptoms have been present for the past several weeks.  States symptoms started with itching, and progressed to her current symptoms.  She states that she is also beginning to have itching in the left ear.  Patient denies injury, trauma,fever, chills, body aches, chest pain, abdominal pain, nausea vomiting or diarrhea.  Patient has been using a prescription of neomycin, patient states symptoms have not improved, she is also taken Advil.  Patient reports that she does have a referral placed by her PCP for her to see ENT.  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  April 2019   Bronchitis    Cervical lymphadenitis 11/10/2015   Chronic fatigue 11/10/2015   Chronic headaches    Fatigue 11/10/2015   Fibromyalgia 11/10/2015   Myalgia 11/10/2015   Ovarian cyst    Smoker 11/10/2015    Patient Active Problem List   Diagnosis Date Noted   Ecchymosis 03/24/2022   Recurrent otitis externa of right ear 02/18/2022   Tinnitus of right ear 02/18/2022   Intractable migraine without aura and without status migrainosus 08/16/2020   Neuropathy of right hand 08/16/2020   Analgesic rebound headache 08/16/2020   Perennial allergic rhinitis 08/09/2017   Anaphylactic shock due to adverse food reaction 08/09/2017   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 05/11/2017   Insomnia 12/01/2016   Anxiety and depression 11/05/2016   Menorrhagia with regular cycle 11/05/2016   Dysmenorrhea, unspecified 11/05/2016   Gastroesophageal reflux disease 11/05/2016    Irritable bowel syndrome with both constipation and diarrhea 11/05/2016   Fatigue 11/10/2015   Fibromyalgia 11/10/2015   Smoker 11/10/2015   Chronic fatigue 11/10/2015   Depression 08/27/2014    Past Surgical History:  Procedure Laterality Date   APPENDECTOMY     BREAST BIOPSY Left 11/24/2017   fibrocystic change benign   OVARIAN CYST SURGERY     TUBAL LIGATION      OB History     Gravida      Para      Term      Preterm      AB      Living  2      SAB      IAB      Ectopic      Multiple      Live Births               Home Medications    Prior to Admission medications   Medication Sig Start Date End Date Taking? Authorizing Provider  ciprofloxacin (CIPRO) 500 MG tablet Take 1 tablet (500 mg total) by mouth 2 (two) times daily for 7 days. 09/01/22 09/08/22 Yes Sadeen Wiegel-Warren, Sadie Haber, NP  albuterol (VENTOLIN HFA) 108 (90 Base) MCG/ACT inhaler Inhale 2 puffs into the lungs every 6 (six) hours as needed for wheezing or shortness of breath. 01/14/22   Cathlyn Parsons, NP  amoxicillin-clavulanate (AUGMENTIN) 875-125 MG tablet Take 1 tablet by mouth 2 (two) times daily.  06/18/22   Margaretann Loveless, PA-C  aspirin-acetaminophen-caffeine (EXCEDRIN MIGRAINE) [phone] MG tablet Take 2 tablets by mouth every 6 (six) hours as needed for headache.    [provider]  buPROPion (WELLBUTRIN SR) 200 MG 12 hr tablet Take 1 tablet (200 mg total) by mouth daily. 08/23/22   [name], FNP  cetirizine-pseudoephedrine (ZYRTEC-D) 5-120 MG tablet Take 1 tablet by mouth daily. 09/25/21   Ozie Dimaria-Warren, Sadie Haber, NP  Cholecalciferol (VITAMIN D PO) Take by mouth.    [provider]  EPINEPHrine 0.3 mg/0.3 mL IJ SOAJ injection  05/12/17   [provider]  fexofenadine (ALLEGRA) 180 MG tablet Take 1 tablet (180 mg total) by mouth daily. 07/15/21   Babs Sciara, MD  fluticasone (FLONASE) 50 MCG/ACT nasal spray Place 1 spray into both nostrils 2 (two)  times daily. 11/26/21   Particia Nearing, PA-C  hydrOXYzine (VISTARIL) 25 MG capsule Take 1 capsule (25 mg total) by mouth every 8 (eight) hours as needed. 08/06/22   [name], FNP  ibuprofen (ADVIL) 600 MG tablet Take 1 tablet (600 mg total) by mouth every 8 (eight) hours as needed. 06/18/22   Margaretann Loveless, PA-C  Multiple Vitamin (MULTIVITAMIN) tablet Take 1 tablet by mouth daily.    [provider]  neomycin-polymyxin-hydrocortisone (CORTISPORIN) OTIC solution Place 3 drops into the right ear 4 (four) times daily. X 7 days 02/17/22   [name], FNP  nicotine (NICODERM CQ - DOSED IN MG/24 HOURS) 21 mg/24hr patch Place 1 patch (21 mg total) onto the skin daily. 11/10/20   Babs Sciara, MD  ondansetron (ZOFRAN) 4 MG tablet Take 1 tablet (4 mg total) by mouth every 8 (eight) hours as needed for nausea or vomiting. 08/23/22   [name], FNP  Probiotic Product (PROBIOTIC DAILY PO) Take by mouth.    [provider]  Rimegepant Sulfate (NURTEC) 75 MG TBDP Take 1 tablet (75 mg total) by mouth every other day. 08/23/22   [name], FNP  Spacer/Aero-Holding Chambers DEVI 1 each by Does not apply route as needed. 01/14/22   Cathlyn Parsons, NP  SUMAtriptan (IMITREX) 100 MG tablet Take 1 tablet by mouth every 2  hours as needed for migraine. May repeat in 2 hours if headache persists or recurs.Wait at least 2 hours between doses (max 2tabs/24 hours) 08/23/22   [name], FNP    Family History Family History  Problem Relation Age of Onset   Breast cancer Mother    Breast cancer Sister    Asthma Sister    Breast cancer Maternal Grandmother    Asthma Brother    Allergic rhinitis Neg Hx    Eczema Neg Hx    Immunodeficiency Neg Hx    Urticaria Neg Hx    Angioedema Neg Hx     Social History Social History   Tobacco Use   Smoking status: Every Day    Current packs/day: 0.50    Average packs/day: 0.5 packs/day for 9.1 years (4.6 ttl pk-yrs)     Types: Cigarettes    Start date: 07/23/2013   Smokeless tobacco: Never  Substance Use Topics   Alcohol use: No   Drug use: No     Allergies   Ivp dye [iodinated contrast media], Sulfa antibiotics, Codeine, Latex, Morphine and codeine, Topamax [topiramate], and Vicodin [hydrocodone-acetaminophen]   Review of Systems Review of Systems Per HPI  Physical Exam Triage Vital Signs ED Triage Vitals [09/01/22 1537]  Encounter Vitals Group  BP 112/70     Systolic BP Percentile      Diastolic BP Percentile      Pulse Rate 88     Resp 20     Temp 98.3 F (36.8 C)     Temp Source Oral     SpO2 98 %     Weight      Height      Head Circumference      Peak Flow      Pain Score 5     Pain Loc      Pain Education      Exclude from Growth Chart    No data found.  Updated Vital Signs BP 112/70 (BP Location: Right Arm)   Pulse 88   Temp 98.3 F (36.8 C) (Oral)   Resp 20   LMP 08/29/2022 (Approximate)   SpO2 98%   Visual Acuity Right Eye Distance:   Left Eye Distance:   Bilateral Distance:    Right Eye Near:   Left Eye Near:    Bilateral Near:     Physical Exam Vitals and nursing note reviewed.  Constitutional:      General: She is not in acute distress.    Appearance: Normal appearance.  HENT:     Head: Normocephalic.     Jaw: Tenderness present.     Right Ear: External ear normal. Decreased hearing noted. Tenderness present. No drainage or swelling.     Left Ear: Tympanic membrane, ear canal and external ear normal.     Ears:     Comments: Moderate tenderness noted to the tragus and pinna of the right ear.  Tenderness noted to the preauricular area of the right ear extending to the right jaw, tenderness also noted behind the right ear.  There is no obvious swelling or erythema present.  Canal of the right ear is moderately inflamed and swollen.  Patient with difficulty tolerating otoscope.    Nose: Nose normal.     Mouth/Throat:     Mouth: Mucous membranes are  moist.  Eyes:     Extraocular Movements: Extraocular movements intact.     Pupils: Pupils are equal, round, and reactive to light.  Cardiovascular:     Rate and Rhythm: Normal rate and regular rhythm.  Pulmonary:     Effort: Pulmonary effort is normal.  Musculoskeletal:     Cervical back: Normal range of motion.  Lymphadenopathy:     Cervical: No cervical adenopathy.  Skin:    General: Skin is warm and dry.  Neurological:     General: No focal deficit present.     Mental Status: She is alert and oriented to person, place, and time.  Psychiatric:        Mood and Affect: Mood normal.        Behavior: Behavior normal.    UC Treatments / Results  Labs (all labs ordered are listed, but only abnormal results are displayed) Labs Reviewed - No data to display  EKG   Radiology No results found.  Procedures Procedures (including critical care time)  Medications Ordered in UC Medications - No data to display  Initial Impression / Assessment and Plan / UC Course  I have reviewed the triage vital signs and the nursing notes.  Pertinent labs & imaging results that were available during my care of the patient were reviewed by me and considered in my medical decision making (see chart for details).  Patient with significant right otitis externa that extends behind  the right ear into the right jaw.  She has been trying neomycin eardrops with minimal relief.  Given the severity of her current symptoms, will treat with Cipro 500 mg twice daily for the next 7 days.  Supportive care recommendations were provided and discussed with the patient to include continuing over-the-counter analgesics for pain or discomfort, warm compresses to the affected ear, and keeping water out of the right ear out of the right ear while symptoms persist.  Patient advised to follow-up with her PCP.  Patient advised to continue following up with her PCP regarding referral.  Patient also advised to follow-up with her  PCP if symptoms fail to improve.  Patient is in agreement with this plan of care and verbalizes understanding.  All questions were answered.  Patient stable for discharge.  Final Clinical Impressions(s) / UC Diagnoses   Final diagnoses:  Infective otitis externa of right ear     Discharge Instructions      Take medication as prescribed. Continue over-the-counter Tylenol or ibuprofen as needed for pain, fever, or general discomfort. Warm compresses to the right ear to help with pain or discomfort. Continuing to avoid entrance of water inside of the ear while symptoms persist. Continue to follow-up with your PCP office regarding referral to ENT.  Follow-up with your primary care physician if symptoms fail to improve. Follow-up as needed.     ED Prescriptions     Medication Sig Dispense Auth. Provider   ciprofloxacin (CIPRO) 500 MG tablet Take 1 tablet (500 mg total) by mouth 2 (two) times daily for 7 days. 14 tablet Francie Keeling-Warren, Sadie Haber, NP      PDMP not reviewed this encounter.   Abran Cantor, NP 09/01/22 1615

## 2022-09-03 ENCOUNTER — Emergency Department (HOSPITAL_COMMUNITY)
Admission: EM | Admit: 2022-09-03 | Discharge: 2022-09-03 | Disposition: A | Discharge status: Home / Self Care | Payer: 59 | Attending: Emergency Medicine | Admitting: Emergency Medicine

## 2022-09-03 ENCOUNTER — Other Ambulatory Visit: Payer: Self-pay

## 2022-09-03 ENCOUNTER — Encounter (HOSPITAL_COMMUNITY): Payer: Self-pay | Admitting: Emergency Medicine

## 2022-09-03 ENCOUNTER — Other Ambulatory Visit (HOSPITAL_COMMUNITY): Payer: Self-pay

## 2022-09-03 DIAGNOSIS — Z91040 Latex allergy status: Secondary | ICD-10-CM | POA: Insufficient documentation

## 2022-09-03 DIAGNOSIS — H9201 Otalgia, right ear: Secondary | ICD-10-CM | POA: Diagnosis present

## 2022-09-03 DIAGNOSIS — Z7982 Long term (current) use of aspirin: Secondary | ICD-10-CM | POA: Insufficient documentation

## 2022-09-03 DIAGNOSIS — H6091 Unspecified otitis externa, right ear: Secondary | ICD-10-CM | POA: Diagnosis not present

## 2022-09-03 DIAGNOSIS — H6061 Unspecified chronic otitis externa, right ear: Secondary | ICD-10-CM | POA: Insufficient documentation

## 2022-09-03 MED ORDER — ONDANSETRON 4 MG PO TBDP
4.0000 mg | ORAL_TABLET | Freq: Once | ORAL | Status: AC
  Administered 2022-09-03: 4 mg via ORAL
  Filled 2022-09-03: qty 1

## 2022-09-03 MED ORDER — ONDANSETRON HCL 4 MG/2ML IJ SOLN: 4.0000 mg | Freq: Once | INTRAMUSCULAR | Status: DC

## 2022-09-03 MED ORDER — HYDROCODONE-ACETAMINOPHEN 5-325 MG PO TABS
0.5000 | ORAL_TABLET | Freq: Once | ORAL | Status: DC
  Filled 2022-09-03: qty 1

## 2022-09-03 MED ORDER — ONDANSETRON 4 MG PO TBDP
4.0000 mg | ORAL_TABLET | Freq: Three times a day (TID) | ORAL | 0 refills | Status: DC | PRN
  Filled 2022-09-03 – 2022-10-06 (×2): qty 20, 7d supply, fill #0

## 2022-09-03 NOTE — ED Provider Notes (Signed)
[place] [facility]  Provider Note  CSN: [id number] Arrival date & time: 09/03/22 0455  History Chief Complaint  Patient presents with   Otalgia    Lisa Fields is a 42 y.o. female with history of recurrent otitis externa reports R ear pain for the last several days. She was using Abx drops from prior infection without improvement. Went to UC on 8/7 and was given Rx for oral Cipro which has also not helped. She denies any fever. No drainage from ear. She has been referred to ENT but that appointment is not until September. She is having some nausea.    Home Medications Prior to Admission medications   Medication Sig Start Date End Date Taking? Authorizing Provider  ondansetron (ZOFRAN-ODT) 4 MG disintegrating tablet Take 1 tablet (4 mg total) by mouth every 8 (eight) hours as needed for nausea or vomiting. 09/03/22  Yes Pollyann Savoy, MD  albuterol (VENTOLIN HFA) 108 (90 Base) MCG/ACT inhaler Inhale 2 puffs into the lungs every 6 (six) hours as needed for wheezing or shortness of breath. 01/14/22   Cathlyn Parsons, NP  aspirin-acetaminophen-caffeine (EXCEDRIN MIGRAINE) [phone] MG tablet Take 2 tablets by mouth every 6 (six) hours as needed for headache.    [provider]  buPROPion (WELLBUTRIN SR) 200 MG 12 hr tablet Take 1 tablet (200 mg total) by mouth daily. 08/23/22   [name], FNP  cetirizine-pseudoephedrine (ZYRTEC-D) 5-120 MG tablet Take 1 tablet by mouth daily. 09/25/21   Leath-Warren, Sadie Haber, NP  Cholecalciferol (VITAMIN D PO) Take by mouth.    [provider]  ciprofloxacin (CIPRO) 500 MG tablet Take 1 tablet (500 mg total) by mouth 2 (two) times daily for 7 days. 09/01/22 09/08/22  Leath-Warren, Sadie Haber, NP  EPINEPHrine 0.3 mg/0.3 mL IJ SOAJ injection  05/12/17   [provider]  fexofenadine (ALLEGRA) 180 MG tablet Take 1 tablet (180 mg total) by mouth daily. 07/15/21   Babs Sciara, MD   fluticasone (FLONASE) 50 MCG/ACT nasal spray Place 1 spray into both nostrils 2 (two) times daily. 11/26/21   Particia Nearing, PA-C  hydrOXYzine (VISTARIL) 25 MG capsule Take 1 capsule (25 mg total) by mouth every 8 (eight) hours as needed. 08/06/22   [name], FNP  ibuprofen (ADVIL) 600 MG tablet Take 1 tablet (600 mg total) by mouth every 8 (eight) hours as needed. 06/18/22   Margaretann Loveless, PA-C  Multiple Vitamin (MULTIVITAMIN) tablet Take 1 tablet by mouth daily.    [provider]  neomycin-polymyxin-hydrocortisone (CORTISPORIN) OTIC solution Place 3 drops into the right ear 4 (four) times daily. X 7 days 02/17/22   [name], FNP  nicotine (NICODERM CQ - DOSED IN MG/24 HOURS) 21 mg/24hr patch Place 1 patch (21 mg total) onto the skin daily. 11/10/20   Babs Sciara, MD  Probiotic Product (PROBIOTIC DAILY PO) Take by mouth.    [provider]  Rimegepant Sulfate (NURTEC) 75 MG TBDP Take 1 tablet (75 mg total) by mouth every other day. 08/23/22   [name], FNP  Spacer/Aero-Holding Chambers DEVI 1 each by Does not apply route as needed. 01/14/22   Cathlyn Parsons, NP  SUMAtriptan (IMITREX) 100 MG tablet Take 1 tablet by mouth every 2  hours as needed for migraine. May repeat in 2 hours if headache persists or recurs.Wait at least 2 hours between doses (max 2tabs/24 hours) 08/23/22   [name], FNP  Allergies    Ivp dye [iodinated contrast media], Sulfa antibiotics, Codeine, Latex, Morphine and codeine, Topamax [topiramate], and Vicodin [hydrocodone-acetaminophen]   Review of Systems   Review of Systems Please see HPI for pertinent positives and negatives  Physical Exam BP 119/67 (BP Location: Left Arm)   Pulse (!) 113   Temp 98.4 F (36.9 C) (Oral)   Resp (!) 22   Ht 5\' 9"  (1.753 m)   Wt 80 kg   LMP 08/29/2022 (Approximate)   SpO2 98%   BMI 26.05 kg/m   Physical Exam Vitals and nursing note reviewed.  HENT:     Head:  Normocephalic.     Right Ear: Tympanic membrane normal. Swelling (external auditory canal) present. No drainage.     Left Ear: Tympanic membrane and ear canal normal.     Nose: Nose normal.  Eyes:     Extraocular Movements: Extraocular movements intact.  Pulmonary:     Effort: Pulmonary effort is normal.  Musculoskeletal:        General: Normal range of motion.     Cervical back: Neck supple.  Skin:    Findings: No rash (on exposed skin).  Neurological:     Mental Status: She is alert and oriented to person, place, and time.  Psychiatric:        Mood and Affect: Mood normal.     ED Results / Procedures / Treatments   EKG None  Procedures Procedures  Medications Ordered in the ED Medications  HYDROcodone-acetaminophen (NORCO/VICODIN) 5-325 MG per tablet 0.5 tablet (has no administration in time range)  ondansetron (ZOFRAN-ODT) disintegrating tablet 4 mg (has no administration in time range)    Initial Impression and Plan  Patient here with continued R ear pain. Some external canal swelling, but no drainage or signs of mastoiditis or other deep infection. She is currently on cipro, just started in the last 36 hours. Offered Rx for pain medication but she declines however she will accept a dose here. She is also requesting a medication for nausea. She was advised that no additional 'stronger' antibiotics would be helpful. Encouraged to give the cipro more time to work. Continue OTC pain medications if she does not want prescription. Rx for Zofran for nausea.   ED Course       MDM Rules/Calculators/A&P Medical Decision Making Problems Addressed: Chronic otitis externa of right ear, unspecified type: chronic illness or injury with exacerbation, progression, or side effects of treatment  Risk OTC drugs. Prescription drug management.     Final Clinical Impression(s) / ED Diagnoses Final diagnoses:  Chronic otitis externa of right ear, unspecified type    Rx / DC  Orders ED Discharge Orders          Ordered    ondansetron (ZOFRAN-ODT) 4 MG disintegrating tablet  Every 8 hours PRN        09/03/22 0525             Pollyann Savoy, MD 09/03/22 [phone]

## 2022-09-03 NOTE — ED Triage Notes (Signed)
Pt with c/o severe R ear pain and nausea. Pt seen at Urgent Care on Wed on dx with ear infection and started on " a steroid and Keflex".

## 2022-09-07 NOTE — Telephone Encounter (Signed)
Message was not routed to staff. Provider and CMA have completed and faxed paperwork.  Faxed with confirmation.  Form fee was processed.

## 2022-09-08 NOTE — Telephone Encounter (Signed)
Spoke to patient, she has already picked up fmla forms

## 2022-09-15 ENCOUNTER — Other Ambulatory Visit (HOSPITAL_COMMUNITY): Payer: Self-pay

## 2022-09-20 ENCOUNTER — Ambulatory Visit: Payer: 59 | Admitting: Family Medicine

## 2022-10-04 ENCOUNTER — Encounter: Payer: Self-pay | Admitting: Family Medicine

## 2022-10-04 ENCOUNTER — Ambulatory Visit: Payer: 59 | Admitting: Family Medicine

## 2022-10-04 ENCOUNTER — Other Ambulatory Visit (HOSPITAL_COMMUNITY): Payer: Self-pay

## 2022-10-04 VITALS — BP 108/74 | HR 85 | Ht 69.0 in | Wt 180.0 lb

## 2022-10-04 DIAGNOSIS — F32A Depression, unspecified: Secondary | ICD-10-CM | POA: Diagnosis not present

## 2022-10-04 DIAGNOSIS — G43019 Migraine without aura, intractable, without status migrainosus: Secondary | ICD-10-CM

## 2022-10-04 DIAGNOSIS — G43109 Migraine with aura, not intractable, without status migrainosus: Secondary | ICD-10-CM

## 2022-10-04 DIAGNOSIS — F419 Anxiety disorder, unspecified: Secondary | ICD-10-CM | POA: Diagnosis not present

## 2022-10-04 MED ORDER — RIBOFLAVIN 400 MG PO CAPS
1.0000 | ORAL_CAPSULE | Freq: Every day | ORAL | 2 refills | Status: AC
Start: 2022-10-04 — End: ?
  Filled 2022-10-04 – 2022-11-18 (×3): qty 90, 90d supply, fill #0

## 2022-10-04 NOTE — Progress Notes (Unsigned)
Established Patient Office Visit  Subjective:  Patient ID: Lisa Fields, female    DOB: [birth year]  Age: 42 y.o. MRN: [id number]  CC:  Chief Complaint  Patient presents with   Care Management    6 week f/u, has concerns about bupropion smell (has medication with her) also states she d/c nurtec due to side effects.      HPI Lisa Fields is a 42 y.o. female with past medical history of *** presents for f/u of *** chronic medical conditions.  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  April 2019   Bronchitis    Cervical lymphadenitis 11/10/2015   Chronic fatigue 11/10/2015   Chronic headaches    Fatigue 11/10/2015   Fibromyalgia 11/10/2015   Myalgia 11/10/2015   Ovarian cyst    Smoker 11/10/2015    Past Surgical History:  Procedure Laterality Date   APPENDECTOMY     BREAST BIOPSY Left 11/24/2017   fibrocystic change benign   OVARIAN CYST SURGERY     TUBAL LIGATION      Family History  Problem Relation Age of Onset   Breast cancer Mother    Breast cancer Sister    Asthma Sister    Breast cancer Maternal Grandmother    Asthma Brother    Allergic rhinitis Neg Hx    Eczema Neg Hx    Immunodeficiency Neg Hx    Urticaria Neg Hx    Angioedema Neg Hx     Social History   Socioeconomic History   Marital status: Significant Other    Spouse name: Not on file   Number of children: Not on file   Years of education: Not on file   Highest education level: Some college, no degree  Occupational History   Not on file  Tobacco Use   Smoking status: Every Day    Current packs/day: 0.50    Average packs/day: 0.5 packs/day for 9.2 years (4.6 ttl pk-yrs)    Types: Cigarettes    Start date: 07/23/2013   Smokeless tobacco: Never  Substance and Sexual Activity   Alcohol use: No   Drug use: No   Sexual activity: Yes    Birth control/protection: Surgical  Other Topics  Concern   Not on file  Social History Narrative   Not on file   Social Determinants of Health   Financial Resource Strain: Medium Risk (08/18/2022)   Overall Financial Resource Strain (CARDIA)    Difficulty of Paying Living Expenses: Somewhat hard  Food Insecurity: Food Insecurity Present (08/18/2022)   Hunger Vital Sign    Worried About Running Out of Food in the Last Year: Sometimes true    Ran Out of Food in the Last Year: Sometimes true  Transportation Needs: No Transportation Needs (08/18/2022)   PRAPARE - Administrator, Civil Service (Medical): No    Lack of Transportation (Non-Medical): No  Physical Activity: Insufficiently Active (08/18/2022)   Exercise Vital Sign    Days of Exercise per Week: 3 days    Minutes of Exercise per Session: 30 min  Stress: Stress Concern Present (08/18/2022)   [employer] of Occupational Health - Occupational Stress Questionnaire    Feeling of Stress : To some extent  Social Connections: Unknown (08/18/2022)   Social Connection and Isolation Panel [NHANES]    Frequency of Communication with Friends and Family: More than three times a week  Frequency of Social Gatherings with Friends and Family: More than three times a week    Attends Religious Services: Not on file    Active Member of Clubs or Organizations: Yes    Attends Engineer, structural: More than 4 times per year    Marital Status: Living with partner  Intimate Partner Violence: Not on file    Outpatient Medications Prior to Visit  Medication Sig Dispense Refill   albuterol (VENTOLIN HFA) 108 (90 Base) MCG/ACT inhaler Inhale 2 puffs into the lungs every 6 (six) hours as needed for wheezing or shortness of breath. 8 g 0   aspirin-acetaminophen-caffeine (EXCEDRIN MIGRAINE) 250-250-65 MG tablet Take 2 tablets by mouth every 6 (six) hours as needed for headache.     buPROPion (WELLBUTRIN SR) 200 MG 12 hr tablet Take 1 tablet (200 mg total) by mouth daily. 90  tablet 1   cetirizine-pseudoephedrine (ZYRTEC-D) 5-120 MG tablet Take 1 tablet by mouth daily. 30 tablet 0   Cholecalciferol (VITAMIN D PO) Take by mouth.     EPINEPHrine 0.3 mg/0.3 mL IJ SOAJ injection   0   fexofenadine (ALLEGRA) 180 MG tablet Take 1 tablet (180 mg total) by mouth daily. 90 tablet 3   fluticasone (FLONASE) 50 MCG/ACT nasal spray Place 1 spray into both nostrils 2 (two) times daily. 16 g 2   hydrOXYzine (VISTARIL) 25 MG capsule Take 1 capsule (25 mg total) by mouth every 8 (eight) hours as needed. 30 capsule 0   ibuprofen (ADVIL) 600 MG tablet Take 1 tablet (600 mg total) by mouth every 8 (eight) hours as needed. 30 tablet 0   Multiple Vitamin (MULTIVITAMIN) tablet Take 1 tablet by mouth daily.     neomycin-polymyxin-hydrocortisone (CORTISPORIN) OTIC solution Place 3 drops into the right ear 4 (four) times daily. X 7 days 10 mL 0   nicotine (NICODERM CQ - DOSED IN MG/24 HOURS) 21 mg/24hr patch Place 1 patch (21 mg total) onto the skin daily. 14 patch 3   ondansetron (ZOFRAN-ODT) 4 MG disintegrating tablet Take 1 tablet (4 mg total) by mouth every 8 (eight) hours as needed for nausea or vomiting. 20 tablet 0   Probiotic Product (PROBIOTIC DAILY PO) Take by mouth.     Spacer/Aero-Holding Chambers DEVI 1 each by Does not apply route as needed. 1 each 0   SUMAtriptan (IMITREX) 100 MG tablet Take 1 tablet by mouth every 2  hours as needed for migraine. May repeat in 2 hours if headache persists or recurs.Wait at least 2 hours between doses (max 2tabs/24 hours) 10 tablet 0   Rimegepant Sulfate (NURTEC) 75 MG TBDP Take 1 tablet (75 mg total) by mouth every other day. 30 tablet 1   No facility-administered medications prior to visit.    Allergies  Allergen Reactions   Ivp Dye [Iodinated Contrast Media] Shortness Of Breath and Rash   Sulfa Antibiotics Nausea And Vomiting   Codeine Other (See Comments)    "I'm Delusional"   Latex Hives   Morphine And Codeine Other (See Comments)     "bottoms out my blood pressure"   Topamax [Topiramate]     Hot flashes, leg pain   Vicodin [Hydrocodone-Acetaminophen] Other (See Comments)    Hallucinations: "I see spiders"    ROS Review of Systems    Objective:    Physical Exam  BP 108/74   Pulse 85   Ht 5\' 9"  (1.753 m)   Wt 180 lb 0.6 oz (81.7 kg)   SpO2 96%  BMI 26.59 kg/m  Wt Readings from Last 3 Encounters:  10/04/22 180 lb 0.6 oz (81.7 kg)  09/03/22 176 lb 5.9 oz (80 kg)  06/22/22 177 lb 0.6 oz (80.3 kg)    Lab Results  Component Value Date   TSH 2.940 03/31/2022   Lab Results  Component Value Date   WBC 5.2 03/31/2022   HGB 13.5 03/31/2022   HCT 40.0 03/31/2022   MCV 93 03/31/2022   PLT 282 03/31/2022   Lab Results  Component Value Date   NA 138 03/31/2022   K 4.2 03/31/2022   CO2 23 03/31/2022   GLUCOSE 86 03/31/2022   BUN 7 03/31/2022   CREATININE 0.79 03/31/2022   BILITOT 0.3 03/31/2022   ALKPHOS 58 03/31/2022   AST 14 03/31/2022   ALT 13 03/31/2022   PROT 6.5 03/31/2022   ALBUMIN 4.4 03/31/2022   CALCIUM 9.0 03/31/2022   ANIONGAP 8 06/15/2017   EGFR 96 03/31/2022   Lab Results  Component Value Date   CHOL 176 03/31/2022   Lab Results  Component Value Date   HDL 48 03/31/2022   Lab Results  Component Value Date   LDLCALC 114 (H) 03/31/2022   Lab Results  Component Value Date   TRIG 72 03/31/2022   Lab Results  Component Value Date   CHOLHDL 3.7 03/31/2022   Lab Results  Component Value Date   HGBA1C 5.0 03/31/2022      Assessment & Plan:  There are no diagnoses linked to this encounter.  Follow-up: No follow-ups on file.   [name], FNP

## 2022-10-04 NOTE — Patient Instructions (Addendum)
I appreciate the opportunity to provide care to you today!    Follow up:  3 months  Migraine Management  Prevention:Lifestyle Changes: Reduce or eliminate caffeine and alcohol intake. Maintain a regular eating and sleeping schedule. Engage in regular exercise several times per week. Supplements:  Riboflavin (Vitamin B2) 400 mg daily is recommended for migraine prevention. Migraine Rescue: For migraine relief, take Sumatriptan 25 mg at the onset of a migraine with a glass of liquid. Wait at least 2 hours between doses, with a maximum of 200 mg per 24-hour period.    Please continue to a heart-healthy diet and increase your physical activities. Try to exercise for at least five days a week.    It was a pleasure to see you and I look forward to continuing to work together on your health and well-being. Please do not hesitate to call the office if you need care or have questions about your care.  In case of emergency, please visit the Emergency Department for urgent care, or contact our clinic at [phone] to schedule an appointment. We're here to help you!   Have a wonderful day and week. With Gratitude, [name] MSN, FNP-BC

## 2022-10-06 ENCOUNTER — Other Ambulatory Visit (HOSPITAL_COMMUNITY): Payer: Self-pay

## 2022-10-07 ENCOUNTER — Other Ambulatory Visit: Payer: Self-pay

## 2022-10-07 NOTE — Assessment & Plan Note (Signed)
Encouraged to continue taking Wellbutrin 200 mg daily and she reports worsening of her depression, suicidal thoughts and ideation

## 2022-10-07 NOTE — Assessment & Plan Note (Signed)
Migraine Management  Prevention:Lifestyle Changes: Reduce or eliminate caffeine and alcohol intake. Maintain a regular eating and sleeping schedule. Engage in regular exercise several times per week. Supplements:  Riboflavin (Vitamin B2) 400 mg daily is recommended for migraine prevention. Migraine Rescue: For migraine relief, take Sumatriptan 25 mg at the onset of a migraine with a glass of liquid. Wait at least 2 hours between doses, with a maximum of 200 mg per 24-hour period.

## 2022-10-08 ENCOUNTER — Ambulatory Visit (INDEPENDENT_AMBULATORY_CARE_PROVIDER_SITE_OTHER): Payer: 59 | Admitting: Otolaryngology

## 2022-10-08 ENCOUNTER — Encounter (INDEPENDENT_AMBULATORY_CARE_PROVIDER_SITE_OTHER): Payer: Self-pay | Admitting: Otolaryngology

## 2022-10-08 VITALS — BP 124/82 | HR 77 | Ht 69.0 in | Wt 181.8 lb

## 2022-10-08 DIAGNOSIS — H6993 Unspecified Eustachian tube disorder, bilateral: Secondary | ICD-10-CM | POA: Diagnosis not present

## 2022-10-08 DIAGNOSIS — H6060 Unspecified chronic otitis externa, unspecified ear: Secondary | ICD-10-CM

## 2022-10-08 DIAGNOSIS — H9211 Otorrhea, right ear: Secondary | ICD-10-CM | POA: Diagnosis not present

## 2022-10-08 DIAGNOSIS — R6884 Jaw pain: Secondary | ICD-10-CM

## 2022-10-08 DIAGNOSIS — R0981 Nasal congestion: Secondary | ICD-10-CM

## 2022-10-08 DIAGNOSIS — R220 Localized swelling, mass and lump, head: Secondary | ICD-10-CM | POA: Diagnosis not present

## 2022-10-08 DIAGNOSIS — M26621 Arthralgia of right temporomandibular joint: Secondary | ICD-10-CM

## 2022-10-08 DIAGNOSIS — H9201 Otalgia, right ear: Secondary | ICD-10-CM

## 2022-10-08 DIAGNOSIS — J3089 Other allergic rhinitis: Secondary | ICD-10-CM | POA: Diagnosis not present

## 2022-10-08 DIAGNOSIS — M542 Cervicalgia: Secondary | ICD-10-CM | POA: Diagnosis not present

## 2022-10-08 MED ORDER — AMOXICILLIN-POT CLAVULANATE 875-125 MG PO TABS: 1.0000 | ORAL_TABLET | Freq: Two times a day (BID) | ORAL | 0 refills | Status: DC

## 2022-10-08 MED ORDER — CIPROFLOXACIN-DEXAMETHASONE 0.3-0.1 % OT SUSP: 4.0000 [drp] | Freq: Two times a day (BID) | OTIC | 0 refills | Status: AC

## 2022-10-08 MED ORDER — METHYLPREDNISOLONE 4 MG PO TBPK: ORAL_TABLET | ORAL | 1 refills | Status: DC

## 2022-10-08 NOTE — Progress Notes (Unsigned)
ENT CONSULT:  Reason for Consult: recurrent ear infections R > L, facial and jaw pain right side, facial swelling  HPI: Lisa Fields is an 42 y.o. female with hx of smoking daily, history of insomnia/chronic fatigue, migraine headaches on Excedrin as needed and Imitrex, recurrent ear infections x 2 yrs, more recently very frequent every two weeks, without any improvement after multiple courses of oral antibiotics and eardrops. This recent infection started on the left then the other ear, right ear, started to ache, and she had purulent otorrhea.  She was seen by PCP and was started on Corticosporin eardrops, was also given a prescription for ciprofloxacin per record review.  She felt that she had drainage in the back of her throat from the right ear.  She reports significant pain along the right ear jaw and postauricular region that keeps her up at night.  Takes Motrin for pain with some relief. On Flonase and Allegra for nasal congestion and hx of environmental allergies.  Denies history of ear surgery or nasal sinus surgery in the past.  Denies history of dental abscess or poor dentition.   Records Reviewed:  ED visit 09/03/2022 Lisa Fields is a 42 y.o. female with history of recurrent otitis externa reports R ear pain for the last several days. She was using Abx drops from prior infection without improvement. Went to UC on 8/7 and was given Rx for oral Cipro which has also not helped. She denies any fever. No drainage from ear. She has been referred to ENT but that appointment is not until September. She is having some nausea.   Patient here with continued R ear pain. Some external canal swelling, but no drainage or signs of mastoiditis or other deep infection. She is currently on cipro, just started in the last 36 hours. Offered Rx for pain medication but she declines however she will accept a dose here. She is also requesting a medication for nausea. She was advised that no additional  'stronger' antibiotics would be helpful. Encouraged to give the cipro more time to work. Continue OTC pain medications if she does not want prescription. Rx for Zofran for nausea.   PCP note from 05/2022 Lisa Fields is a 42 y.o. female with past medical history of anxiety and depression, insomnia, and fatigue presents for f/u of  chronic medical conditions. For the details of today's visit, please refer to the assessment and plan. Seen for insomnia since 04/30/2022 Lisa Fields is a 42 y.o. female with past medical history of anxiety and depression, insomnia, and fatigue presents for f/u of  chronic medical conditions. For the details of today's visit, please refer to the assessment and plan.    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  April 2019   Bronchitis    Cervical lymphadenitis 11/10/2015   Chronic fatigue 11/10/2015   Chronic headaches    Fatigue 11/10/2015   Fibromyalgia 11/10/2015   Myalgia 11/10/2015   Ovarian cyst    Smoker 11/10/2015    Past Surgical History:  Procedure Laterality Date   APPENDECTOMY     BREAST BIOPSY Left 11/24/2017   fibrocystic change benign   OVARIAN CYST SURGERY     TUBAL LIGATION      Family History  Problem Relation Age of Onset   Breast cancer Mother    Breast cancer Sister    Asthma Sister  Breast cancer Maternal Grandmother    Asthma Brother    Allergic rhinitis Neg Hx    Eczema Neg Hx    Immunodeficiency Neg Hx    Urticaria Neg Hx    Angioedema Neg Hx     Social History:  reports that she has been smoking cigarettes. She started smoking about 9 years ago. She has a 4.6 pack-year smoking history. She has never used smokeless tobacco. She reports that she does not drink alcohol and does not use drugs.  Allergies:  Allergies  Allergen Reactions   Ivp Dye [Iodinated Contrast Media] Shortness Of Breath and Rash   Sulfa Antibiotics  Nausea And Vomiting   Codeine Other (See Comments)    "I'm Delusional"   Latex Hives   Morphine And Codeine Other (See Comments)    "bottoms out my blood pressure"   Topamax [Topiramate]     Hot flashes, leg pain   Vicodin [Hydrocodone-Acetaminophen] Other (See Comments)    Hallucinations: "I see spiders"    Medications: I have reviewed the patient's current medications.  The PMH, PSH, Medications, Allergies, and SH were reviewed and updated.  ROS: Constitutional: Negative for fever, weight loss and weight gain. Cardiovascular: Negative for chest pain and dyspnea on exertion. Respiratory: Is not experiencing shortness of breath at rest. Gastrointestinal: Negative for nausea and vomiting. Neurological: Negative for headaches. Psychiatric: The patient is not nervous/anxious  Blood pressure 124/82, pulse 77, height 5\' 9"  (1.753 m), weight 181 lb 12.8 oz (82.5 kg), SpO2 100%.  PHYSICAL EXAM:  Exam: General: Well-developed, well-nourished, uncomfortable appears to be in pain holding right ear with her hand Respiratory Respiratory effort: Equal inspiration and expiration without stridor Cardiovascular Peripheral Vascular: Warm extremities with equal color/perfusion Eyes: No nystagmus with equal extraocular motion bilaterally Neuro/Psych/Balance: Patient oriented to person, place, and time; Appropriate mood and affect; Gait is intact with no imbalance; Cranial nerves I-XII are intact Head and Face Inspection: Normocephalic and atraumatic without mass or lesion.  Palpation: Facial skeleton intact without bony stepoffs Salivary Glands: No mass or tenderness Facial Strength: Facial motility symmetric and full bilaterally ENT Pinna: External ear intact and fully developed.  Minimal erythema no significant edema of right auricle, no edema swelling or erythema or fluctuance in the postauricular area on the right.  Mild tenderness in postauricular area, pain with right auricular  manipulation.  External canal: Canal is patent, mild edema of right EAC, minimal erythema, no drainage noted, TM appears normal without bulging erythema or fluid in the middle ear. Tympanic Membrane: Clear and mobile External Nose: No scar or anatomic deformity Internal Nose: Septum intact and midline. No edema, polyp, or rhinorrhea Lips, Teeth, and gums: Mucosa and teeth intact and viable TMJ: mild pain to palpation b/l with full mobility Oral cavity/oropharynx: No erythema or exudate, no lesions present Neck Neck and Trachea: Midline trachea without mass or lesion Thyroid: No mass or nodularity Lymphatics: No lymphadenopathy  Studies Reviewed:last set of labs 6 months ago Normal CBC with diff   Assessment/Plan: Encounter Diagnoses  Name Primary?   Right ear pain Yes   Neck pain    Facial swelling    Jaw pain    Otorrhea of right ear    Chronic otitis externa, unspecified laterality, unspecified type    Environmental and seasonal allergies    Dysfunction of both eustachian tubes    Nasal congestion    Arthralgia of right temporomandibular joint     42 year old female with history of chronic nasal congestion on daily  Flonase and Allegra and recurrent otitis externa versus otitis media with symptoms of ear infections periodically for over 2 years per report.  More recently developed right otalgia, otorrhea, facial and ear swelling and jaw pain radiating down her neck a couple of months ago, this is consistent with her prior episodes of ear infection, which typically respond to eardrops and antibiotics given by PCP, but this time her symptoms persisted and failed a course of ciprofloxacin p.o. and Corticosporin eardrops.  In addition to seeing PCP she was also seen in the ED where right ear canal edema was noted.  No imaging up to this point.  No history of ear surgery or nasal sinus surgery.  History of migraine headaches, denies headache with her current symptoms, on Imitrex and  Excedrin for headaches, MRI brain pending which I suspect was ordered to workup her headaches.   On my exam today there was minimal edema of right EAC but no otorrhea TM appeared normal without obvious air-fluid level, she was uncomfortable holding her right ear during the exam and reported pain with right auricular manipulation during the exam, there was mild erythema of right pinna, no significant edema, no postauricular edema erythema or fluctuance but she was somewhat tender subjectively in that area, she was also tender along the right parotid preauricular region.  There was no evidence of odontogenic infection or poor dentition on her exam, oropharynx was normal.   Due to degree of her pain and overall no exam findings to support persistent or severe otitis externa, although her symptoms could be related to unresolved ear infection, more likely cause of her otalgia is not ear related.  Potential possibilities include severe TMJ syndrome, cervicalgia, inflammatory or neoplastic process.  Will order CT neck with contrast and will plan for flexible scope when she returns.  Will empirically treat symptoms with oral steroids oral antibiotic and another course of eardrops.    - CT neck with contrast - RTC after imaging  - nasal endoscopy flexible laryngoscopy when she returns  - she was advised to continue Allegra 180 mg daily and Flonase 2 puffs both nares BID for history of nasal congestion allergies and suspected eustachian tube dysfunction  TMJ sy -we discussed supportive care for TMJ syndrome.  All questions have been answered.   Thank you for allowing me to participate in the care of this patient. Please do not hesitate to contact me with any questions or concerns.   Ashok Croon, MD Otolaryngology [facility] Phone: [phone] Fax: [phone]    10/09/2022, 7:25 AM

## 2022-10-09 NOTE — Patient Instructions (Signed)
  TMJ (Temporomandibular Joint Syndrome) The temporomandibular (tem-puh-roe-man-DIB-u-lur) joint (TMJ) acts like a sliding hinge, connecting your jawbone to your skull. You have one joint on each side of your jaw. TMJ disorders -- a type of temporomandibular disorder or TMD -- can cause pain in your jaw joint and in the muscles that control jaw movement.  The exact cause of a person's TMJ disorder is often difficult to determine. Your pain may be due to a combination of factors, such as genetics, arthritis or jaw injury. Some people who have jaw pain also tend to clench or grind their teeth (bruxism), although many people habitually clench or grind their teeth and never develop TMJ disorders.  In most cases, the pain and discomfort associated with TMJ disorders is temporary and can be relieved with self-managed care or nonsurgical treatments. This includes stress reduction, softer diet when the pain is present, anti-inflammatory pain medications such as Motrin and warm compresses.

## 2022-10-26 ENCOUNTER — Encounter (INDEPENDENT_AMBULATORY_CARE_PROVIDER_SITE_OTHER): Payer: Self-pay | Admitting: Otolaryngology

## 2022-11-09 ENCOUNTER — Telehealth (INDEPENDENT_AMBULATORY_CARE_PROVIDER_SITE_OTHER): Payer: Self-pay | Admitting: Otolaryngology

## 2022-11-09 NOTE — Telephone Encounter (Signed)
pt Lisa Fields 1980-07-01 called and stated that radiology told her that the provider needs to call them to order the 13hr prep.

## 2022-11-10 ENCOUNTER — Other Ambulatory Visit (INDEPENDENT_AMBULATORY_CARE_PROVIDER_SITE_OTHER): Payer: Self-pay | Admitting: Otolaryngology

## 2022-11-10 ENCOUNTER — Telehealth: Payer: Self-pay | Admitting: Otolaryngology

## 2022-11-10 DIAGNOSIS — H9201 Otalgia, right ear: Secondary | ICD-10-CM

## 2022-11-10 DIAGNOSIS — M542 Cervicalgia: Secondary | ICD-10-CM

## 2022-11-10 DIAGNOSIS — R6884 Jaw pain: Secondary | ICD-10-CM

## 2022-11-10 NOTE — Telephone Encounter (Signed)
I spoke with her, I will discuss with Dr. Irene Pap and call her back

## 2022-11-10 NOTE — Progress Notes (Signed)
Pt has a severe iodinated contrast allergy. We will order MRI neck with contrast. Pt aware. She will call radiology to schedule and will also reach out to our office to schedule appointment 2 weeks after MRI results.

## 2022-11-10 NOTE — Telephone Encounter (Signed)
MRI needs to be scheduled without contrast

## 2022-11-11 NOTE — Addendum Note (Signed)
Addended byCurlene Dolphin, Lilibeth Opie on: 11/11/2022 08:58 AM   Modules accepted: Orders

## 2022-11-11 NOTE — Telephone Encounter (Signed)
I spoke with her, it needs to be with and without. I will reorder it, thanks [facility]

## 2022-11-12 ENCOUNTER — Ambulatory Visit (HOSPITAL_COMMUNITY): Payer: 59

## 2022-11-16 ENCOUNTER — Ambulatory Visit (HOSPITAL_COMMUNITY)
Admission: RE | Admit: 2022-11-16 | Discharge: 2022-11-16 | Disposition: A | Discharge status: Home / Self Care | Payer: 59 | Source: Ambulatory Visit | Attending: Physician Assistant | Admitting: Physician Assistant

## 2022-11-16 DIAGNOSIS — H9201 Otalgia, right ear: Secondary | ICD-10-CM | POA: Insufficient documentation

## 2022-11-16 DIAGNOSIS — K118 Other diseases of salivary glands: Secondary | ICD-10-CM | POA: Diagnosis not present

## 2022-11-16 DIAGNOSIS — M542 Cervicalgia: Secondary | ICD-10-CM | POA: Diagnosis not present

## 2022-11-16 DIAGNOSIS — R6884 Jaw pain: Secondary | ICD-10-CM | POA: Insufficient documentation

## 2022-11-16 MED ORDER — GADOBUTROL 1 MMOL/ML IV SOLN
7.5000 mL | Freq: Once | INTRAVENOUS | Status: AC | PRN
  Administered 2022-11-16: 7.5 mL via INTRAVENOUS

## 2022-11-17 ENCOUNTER — Ambulatory Visit (INDEPENDENT_AMBULATORY_CARE_PROVIDER_SITE_OTHER): Payer: 59 | Admitting: Otolaryngology

## 2022-11-18 ENCOUNTER — Other Ambulatory Visit (HOSPITAL_COMMUNITY): Payer: Self-pay

## 2022-11-18 ENCOUNTER — Other Ambulatory Visit: Payer: Self-pay

## 2022-12-20 ENCOUNTER — Encounter (INDEPENDENT_AMBULATORY_CARE_PROVIDER_SITE_OTHER): Payer: Self-pay | Admitting: Otolaryngology

## 2022-12-20 ENCOUNTER — Ambulatory Visit (INDEPENDENT_AMBULATORY_CARE_PROVIDER_SITE_OTHER): Payer: 59 | Admitting: Otolaryngology

## 2022-12-20 VITALS — BP 107/74 | HR 103

## 2022-12-20 DIAGNOSIS — Z720 Tobacco use: Secondary | ICD-10-CM

## 2022-12-20 DIAGNOSIS — R220 Localized swelling, mass and lump, head: Secondary | ICD-10-CM

## 2022-12-20 DIAGNOSIS — J342 Deviated nasal septum: Secondary | ICD-10-CM

## 2022-12-20 DIAGNOSIS — H9313 Tinnitus, bilateral: Secondary | ICD-10-CM | POA: Diagnosis not present

## 2022-12-20 DIAGNOSIS — H6993 Unspecified Eustachian tube disorder, bilateral: Secondary | ICD-10-CM

## 2022-12-20 DIAGNOSIS — M542 Cervicalgia: Secondary | ICD-10-CM

## 2022-12-20 DIAGNOSIS — K219 Gastro-esophageal reflux disease without esophagitis: Secondary | ICD-10-CM

## 2022-12-20 DIAGNOSIS — J3089 Other allergic rhinitis: Secondary | ICD-10-CM

## 2022-12-20 DIAGNOSIS — R0982 Postnasal drip: Secondary | ICD-10-CM

## 2022-12-20 DIAGNOSIS — R09A2 Foreign body sensation, throat: Secondary | ICD-10-CM

## 2022-12-20 DIAGNOSIS — R0989 Other specified symptoms and signs involving the circulatory and respiratory systems: Secondary | ICD-10-CM

## 2022-12-20 DIAGNOSIS — R490 Dysphonia: Secondary | ICD-10-CM

## 2022-12-20 DIAGNOSIS — J343 Hypertrophy of nasal turbinates: Secondary | ICD-10-CM

## 2022-12-20 DIAGNOSIS — M26623 Arthralgia of bilateral temporomandibular joint: Secondary | ICD-10-CM

## 2022-12-20 DIAGNOSIS — R0981 Nasal congestion: Secondary | ICD-10-CM

## 2022-12-20 DIAGNOSIS — M26621 Arthralgia of right temporomandibular joint: Secondary | ICD-10-CM

## 2022-12-20 NOTE — Progress Notes (Unsigned)
ENT Progress Note:  Update 12/20/22:  Discussed the use of AI scribe software for clinical note transcription with the patient, who gave verbal consent to proceed.  History of Present Illness   The patient is a 42 yoF, with a history of nasal congestion and facial swelling, presents for follow-up after an MRI. She had contrast dye allergy in the past, and her CT neck was converted to MRI with contrast. They report persistent bilateral tinnitus and increased itching in the left ear, which they find difficult to ignore. The patient also mentions a history of ear infections, predominantly affecting the right ear, which visibly swells during these episodes. She took antibiotic steroids PO after her visit last time, which successfully reduced facial swelling and sensation of muffled hearing and clogged ears, but did not alleviate the tinnitus.  The patient has been using Allegra regularly and Flonase intermittently, particularly during weather changes. They report that excessive use of Flonase led to severe headaches. They have also experienced episodes of choking on postnasal drainage, particularly at night, which has led to sleep disturbances.  The patient has a history of smoking and has attempted to quit twice through a smoking cessation program, but has struggled with maintaining abstinence. They also report exposure to smoke from a wood stove at home.  The patient has previously used sweet oil for ear itching but discontinued its use due to concerns about causing ear infections. She has chronic sensation of mucus in her throat, and throat clearing, her voice is raspy at baseline.   Initial Consultation 10/08/22 Reason for Consult: recurrent ear infections R > L, facial and jaw pain right side, facial swelling  HPI: Lisa Fields is an 42 y.o. female with hx of smoking daily, history of insomnia/chronic fatigue, migraine headaches on Excedrin as needed and Imitrex, recurrent ear infections x 2  yrs, more recently very frequent every two weeks, without any improvement after multiple courses of oral antibiotics and eardrops. This recent infection started on the left then the other ear, right ear, started to ache, and she had purulent otorrhea.  She was seen by PCP and was started on Corticosporin eardrops, was also given a prescription for ciprofloxacin per record review.  She felt that she had drainage in the back of her throat from the right ear.  She reports significant pain along the right ear jaw and postauricular region that keeps her up at night.  Takes Motrin for pain with some relief. On Flonase and Allegra for nasal congestion and hx of environmental allergies.  Denies history of ear surgery or nasal sinus surgery in the past.  Denies history of dental abscess or poor dentition.   Records Reviewed:  ED visit 09/03/2022 CHANTRA WERTHEIMER is a 42 y.o. female with history of recurrent otitis externa reports R ear pain for the last several days. She was using Abx drops from prior infection without improvement. Went to UC on 8/7 and was given Rx for oral Cipro which has also not helped. She denies any fever. No drainage from ear. She has been referred to ENT but that appointment is not until September. She is having some nausea.   Patient here with continued R ear pain. Some external canal swelling, but no drainage or signs of mastoiditis or other deep infection. She is currently on cipro, just started in the last 36 hours. Offered Rx for pain medication but she declines however she will accept a dose here. She is also requesting a medication for nausea.  She was advised that no additional 'stronger' antibiotics would be helpful. Encouraged to give the cipro more time to work. Continue OTC pain medications if she does not want prescription. Rx for Zofran for nausea.   PCP note from 05/2022 DELORUS FESMIRE is a 42 y.o. female with past medical history of anxiety and depression, insomnia, and  fatigue presents for f/u of  chronic medical conditions. For the details of today's visit, please refer to the assessment and plan. Seen for insomnia since 04/30/2022 CHAE HOLOHAN is a 42 y.o. female with past medical history of anxiety and depression, insomnia, and fatigue presents for f/u of  chronic medical conditions. For the details of today's visit, please refer to the assessment and plan.    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  April 2019   Bronchitis    Cervical lymphadenitis 11/10/2015   Chronic fatigue 11/10/2015   Chronic headaches    Fatigue 11/10/2015   Fibromyalgia 11/10/2015   Myalgia 11/10/2015   Ovarian cyst    Smoker 11/10/2015    Past Surgical History:  Procedure Laterality Date   APPENDECTOMY     BREAST BIOPSY Left 11/24/2017   fibrocystic change benign   OVARIAN CYST SURGERY     TUBAL LIGATION      Family History  Problem Relation Age of Onset   Breast cancer Mother    Breast cancer Sister    Asthma Sister    Breast cancer Maternal Grandmother    Asthma Brother    Allergic rhinitis Neg Hx    Eczema Neg Hx    Immunodeficiency Neg Hx    Urticaria Neg Hx    Angioedema Neg Hx     Social History:  reports that she has been smoking cigarettes. She started smoking about 9 years ago. She has a 4.7 pack-year smoking history. She has never used smokeless tobacco. She reports that she does not drink alcohol and does not use drugs.  Allergies:  Allergies  Allergen Reactions   Ivp Dye [Iodinated Contrast Media] Shortness Of Breath and Rash   Sulfa Antibiotics Nausea And Vomiting   Codeine Other (See Comments)    "I'm Delusional"   Latex Hives   Morphine And Codeine Other (See Comments)    "bottoms out my blood pressure"   Topamax [Topiramate]     Hot flashes, leg pain   Vicodin [Hydrocodone-Acetaminophen] Other (See Comments)    Hallucinations: "I see  spiders"    Medications: I have reviewed the patient's current medications.  The PMH, PSH, Medications, Allergies, and SH were reviewed and updated.  ROS: Constitutional: Negative for fever, weight loss and weight gain. Cardiovascular: Negative for chest pain and dyspnea on exertion. Respiratory: Is not experiencing shortness of breath at rest. Gastrointestinal: Negative for nausea and vomiting. Neurological: Negative for headaches. Psychiatric: The patient is not nervous/anxious  Blood pressure 107/74, pulse (!) 103, SpO2 99%.  PHYSICAL EXAM:  Exam: General: Well-developed, well-nourished, uncomfortable appears to be in pain holding right ear with her hand Respiratory Respiratory effort: Equal inspiration and expiration without stridor Cardiovascular Peripheral Vascular: Warm extremities with equal color/perfusion Eyes: No nystagmus with equal extraocular motion bilaterally Neuro/Psych/Balance: Patient oriented to person, place, and time; Appropriate mood and affect; Gait is intact with no imbalance; Cranial nerves I-XII are intact Head and Face Inspection: Normocephalic and atraumatic without mass or lesion. No facial swelling noted  Palpation:  Facial skeleton intact without bony stepoffs Salivary Glands: No mass or tenderness Facial Strength: Facial motility symmetric and full bilaterally ENT Pinna: External ear intact and fully developed.  Minimal erythema no significant edema of right auricle, no edema swelling or erythema or fluctuance in the postauricular area on the right.  Mild tenderness in postauricular area, pain with right auricular manipulation.  External canal: Canal is patent, mild edema of right EAC, minimal erythema, no drainage noted, TM appears normal without bulging erythema or fluid in the middle ear. Tympanic Membrane: Clear and mobile External Nose: No scar or anatomic deformity Internal Nose: Septum intact with septal deviation and S-shaped septum. There  was evidence of mucosal edema, and clear secretions b/l, but no polyps, or pus. There was evidence of inferior turbinate hypertrophy Lips, Teeth, and gums: Mucosa and teeth intact and viable TMJ: mild pain to palpation R > L with full mobility and no trismus  Oral cavity/oropharynx: No erythema or exudate, no lesions present Larynx: b.l VF are mobile without lesions, moderate post-cricoid edema/pachydermia  Neck Neck and Trachea: Midline trachea without mass or lesion Thyroid: No mass or nodularity Lymphatics: No lymphadenopathy  Procedures  Preoperative diagnosis: hx of smoking, globus sensation throat clearing and raspy voice  Postoperative diagnosis:   Same  Procedure: Flexible fiberoptic laryngoscopy  Surgeon: Ashok Croon, MD  Anesthesia: Topical lidocaine and Afrin Complications: None Condition is stable throughout exam  Indications and consent:  The patient presents to the clinic with Indirect laryngoscopy view was incomplete. Thus it was recommended that they undergo a flexible fiberoptic laryngoscopy. All of the risks, benefits, and potential complications were reviewed with the patient preoperatively and verbal informed consent was obtained.  Procedure: The patient was seated upright in the clinic. Topical lidocaine and Afrin were applied to the nasal cavity. After adequate anesthesia had occurred, I then proceeded to pass the flexible telescope into the nasal cavity. The nasal cavity was patent without rhinorrhea or polyp. The nasopharynx was also patent without mass or lesion. The base of tongue was visualized and was normal. There were no signs of pooling of secretions in the piriform sinuses. The true vocal folds were mobile bilaterally. There were no signs of glottic or supraglottic mucosal lesion or mass. There was moderate interarytenoid pachydermia and post cricoid edema. The telescope was then slowly withdrawn and the patient tolerated the procedure  throughout.    PROCEDURE NOTE: nasal endoscopy  Preoperative diagnosis: chronic nasal congestion and post-nasal drainage symptoms  Postoperative diagnosis: same  Procedure: Diagnostic nasal endoscopy (09811)  Surgeon: Ashok Croon, M.D.  Anesthesia: Topical lidocaine and Afrin  H&P REVIEW: The patient's history and physical were reviewed today prior to procedure. All medications were reviewed and updated as well. Complications: None Condition is stable throughout exam Indications and consent: The patient presents with symptoms of chronic sinusitis not responding to previous therapies. All the risks, benefits, and potential complications were reviewed with the patient preoperatively and informed consent was obtained. The time out was completed with confirmation of the correct procedure.   Procedure: The patient was seated upright in the clinic. Topical lidocaine and Afrin were applied to the nasal cavity. After adequate anesthesia had occurred, the rigid nasal endoscope was passed into the nasal cavity. The nasal mucosa, turbinates, septum, and sinus drainage pathways were visualized bilaterally. This revealed no purulence or significant secretions that might be cultured. There were no polyps or sites of significant inflammation. The mucosa was intact and there was no crusting present. The scope  was then slowly withdrawn and the patient tolerated the procedure well. There were no complications or blood loss.    Studies Reviewed:last set of labs 6 months ago Normal CBC with diff   MRI neck with contrast 11/16/22 IMPRESSION: Minimal right mastoid opacification with normal nasopharynx.No specific cause for right-sided symptoms.   Assessment/Plan: Encounter Diagnoses  Name Primary?   Dysfunction of both eustachian tubes    Arthralgia of right temporomandibular joint    Chronic nasal congestion    Tobacco abuse    Tinnitus of both ears Yes   Bilateral temporomandibular joint  pain    Environmental and seasonal allergies    Nasal septal deviation    Hypertrophy of both inferior nasal turbinates    Chronic GERD    Chronic throat clearing    Dysphonia      42 year old female with history of chronic nasal congestion on daily Flonase and Allegra and recurrent otitis externa versus otitis media with symptoms of ear infections periodically for over 2 years per report.  More recently developed right otalgia, otorrhea, facial and ear swelling and jaw pain radiating down her neck a couple of months ago, this is consistent with her prior episodes of ear infection, which typically respond to eardrops and antibiotics given by PCP, but this time her symptoms persisted and failed a course of ciprofloxacin p.o. and Corticosporin eardrops.  In addition to seeing PCP she was also seen in the ED where right ear canal edema was noted.  No imaging up to this point.  No history of ear surgery or nasal sinus surgery.  History of migraine headaches, denies headache with her current symptoms, on Imitrex and Excedrin for headaches, MRI brain pending which I suspect was ordered to workup her headaches.   On my exam today there was minimal edema of right EAC but no otorrhea TM appeared normal without obvious air-fluid level, she was uncomfortable holding her right ear during the exam and reported pain with right auricular manipulation during the exam, there was mild erythema of right pinna, no significant edema, no postauricular edema erythema or fluctuance but she was somewhat tender subjectively in that area, she was also tender along the right parotid preauricular region.  There was no evidence of odontogenic infection or poor dentition on her exam, oropharynx was normal.   Due to degree of her pain and overall no exam findings to support persistent or severe otitis externa, although her symptoms could be related to unresolved ear infection, more likely cause of her otalgia is not ear related.   Potential possibilities include severe TMJ syndrome, cervicalgia, inflammatory or neoplastic process.  Will order CT neck with contrast and will plan for flexible scope when she returns.  Will empirically treat symptoms with oral steroids oral antibiotic and another course of eardrops.    - CT neck with contrast - RTC after imaging  - nasal endoscopy flexible laryngoscopy when she returns  - she was advised to continue Allegra 180 mg daily and Flonase 2 puffs both nares BID for history of nasal congestion allergies and suspected eustachian tube dysfunction  TMJ sy -we discussed supportive care for TMJ syndrome.  All questions have been answered.   Update 12/21/22 Assessment and Plan    Tinnitus Bilateral tinnitus with persistent ringing despite previous treatment with antibiotic steroids, all other sx reported previously resolved. Ear exam today is unremarkable. Suspected eustachian tube dysfunction vs CHL vs SNHL. Hearing test needed to rule out hearing loss. - Refer to audiology for  hearing test  Suspected Eustachian Tube Dysfunction Possible eustachian tube dysfunction contributing to tinnitus. No current fluid in ears, history of ear infections and swelling, particularly in the right ear.  - Continue Allegra 180 mg daily  - Recommend Flonase as tolerated 2 puffs b/l nares BID  Itchy ears - Use sweet oil for ear itching as needed  Chronic Nasal Congestion and Postnasal Drainage Chronic nasal congestion with septal deviation and enlarged nasal turbinates on recent MRI neck, no evidence of chronic sinusitis. Evidence of nasal mucosal edema, septal deviation and inferior turbinate hypertrophy on nasal endoscopy today. We reviewed imaging results with the patient. Postnasal drainage contributing to sensation of choking/globus sensation - Continue Allegra 180 mg daily - Recommend Flonase 2 puffs b/l nares BID - will consider septo/ITR in the future    Throat clearing and raspy voice at  baseline, evidence of GERD LPR on flexible scope exam today - we discussed diet and lifestyle changes to minimize GERD/LPR - no suspicious lesions on flexible scope exam   Temporomandibular Joint Dysfunction (TMJ) Possible TMJ dysfunction noted during physical examination with hx of right facial swelling and jaw pain. MRI neck was negative for pathology.  - Monitor symptoms - we discussed TMJ supportive care including softer diet, NSAIDs and warm compresses   General Health Maintenance Smoker advised on risks associated with smoking, including increased nasal congestion and cancer risk, spent total of 3 min counseling the patient. Previous unsuccessful attempts at smoking cessation. Discussed impact of smoking and wood stove exposure on nasal congestion. - Encouraged smoking cessation - Discussed smoking cessation program options  Follow-up - Schedule follow-up appointment after hearing test.        Ashok Croon, MD Otolaryngology [facility] Phone: [phone] Fax: [phone]    12/21/2022, 6:37 PM

## 2022-12-27 ENCOUNTER — Institutional Professional Consult (permissible substitution) (INDEPENDENT_AMBULATORY_CARE_PROVIDER_SITE_OTHER): Payer: 59 | Admitting: Audiology

## 2022-12-29 ENCOUNTER — Ambulatory Visit (INDEPENDENT_AMBULATORY_CARE_PROVIDER_SITE_OTHER): Payer: 59 | Admitting: Otolaryngology

## 2022-12-30 NOTE — Progress Notes (Unsigned)
NEUROLOGY CONSULTATION NOTE  Lisa Fields MRN: [id number] DOB: 04-30-1980  Referring provider: [name], FNP Primary care provider: [name], FNP  Reason for consult:  migraines  Assessment/Plan:   ***   Subjective:  Lisa Fields is a 42 year old female with fibromyalgia and chronic fatigue and alpha-gal syndrome who presents for migraines.  History supplemented by referring provider's note.  Onset:  *** Location:  *** Quality:  *** Intensity:  ***.  *** denies new headache, thunderclap headache or severe headache that wakes *** from sleep. Aura:  *** Prodrome:  *** Postdrome:  *** Associated symptoms:  ***.  *** denies associated unilateral numbness or weakness. Duration:  *** Frequency:  *** Frequency of abortive medication: *** Triggers:  *** Relieving factors:  *** Activity:  ***  CT head on 09/04/2015 personally reviewed was normal.  Past NSAIDS/analgesics:  naproxen, ketorolac, acetaminophen, tramadol Past abortive triptans:  *** Past abortive ergotamine:  *** Past muscle relaxants:  cyclobenzaprine Past anti-emetic:  *** Past antihypertensive medications:  *** Past antidepressant medications:  bupropion, citalopram Past anticonvulsant medications:  topiramate Past anti-CGRP:  Nurtec 75mg  every other day (side effects) Past vitamins/Herbal/Supplements:  riboflavin Past antihistamines/decongestants:  Benadryl, Sudafed Other past therapies:  ***  Current NSAIDS/analgesics:  ibuprofen, Exxcedrin Migraine Current triptans:  sumatriptan 100mg  Current ergotamine:  none Current anti-emetic:  Zofran-ODT 4mg  Current muscle relaxants:  none Current Antihypertensive medications:  none Current Antidepressant medications:  none Current Anticonvulsant medications:  none Current anti-CGRP:  none Current Vitamins/Herbal/Supplements:  MVI Current Antihistamines/Decongestants:  Flonase, Zyrtec-D, Allegra Other therapy:  *** Birth control:   *** Other medications:  albuterol   Caffeine:  *** Alcohol:  *** Smoker:  *** Diet:  *** Exercise:  *** Depression:  ***; Anxiety:  *** Other pain:  *** Sleep hygiene:  *** Family history of headache:  ***      PAST MEDICAL HISTORY: Past Medical History:  Diagnosis Date   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 05/11/2017   Patient's alpha gal was positive in regards to IgE for beef.  April 2019   Bronchitis    Cervical lymphadenitis 11/10/2015   Chronic fatigue 11/10/2015   Chronic headaches    Fatigue 11/10/2015   Fibromyalgia 11/10/2015   Myalgia 11/10/2015   Ovarian cyst    Smoker 11/10/2015    PAST SURGICAL HISTORY: Past Surgical History:  Procedure Laterality Date   APPENDECTOMY     BREAST BIOPSY Left 11/24/2017   fibrocystic change benign   OVARIAN CYST SURGERY     TUBAL LIGATION      MEDICATIONS: Current Outpatient Medications on File Prior to Visit  Medication Sig Dispense Refill   albuterol (VENTOLIN HFA) 108 (90 Base) MCG/ACT inhaler Inhale 2 puffs into the lungs every 6 (six) hours as needed for wheezing or shortness of breath. 8 g 0   amoxicillin-clavulanate (AUGMENTIN) 875-125 MG tablet Take 1 tablet by mouth 2 (two) times daily. (Patient not taking: Reported on 12/20/2022) 28 tablet 0   aspirin-acetaminophen-caffeine (EXCEDRIN MIGRAINE) 250-250-65 MG tablet Take 2 tablets by mouth every 6 (six) hours as needed for headache.     buPROPion (WELLBUTRIN SR) 200 MG 12 hr tablet Take 1 tablet (200 mg total) by mouth daily. (Patient not taking: Reported on 12/20/2022) 90 tablet 1   cetirizine-pseudoephedrine (ZYRTEC-D) 5-120 MG tablet Take 1 tablet by mouth daily. 30 tablet 0   Cholecalciferol (VITAMIN D PO) Take by mouth.     EPINEPHrine 0.3 mg/0.3 mL IJ SOAJ injection  (Patient not taking:  Reported on 12/20/2022)  0   fexofenadine (ALLEGRA) 180 MG tablet Take 1 tablet (180 mg total) by mouth daily. 90 tablet 3   fluticasone (FLONASE) 50 MCG/ACT nasal spray Place 1 spray into  both nostrils 2 (two) times daily. 16 g 2   hydrOXYzine (VISTARIL) 25 MG capsule Take 1 capsule (25 mg total) by mouth every 8 (eight) hours as needed. (Patient not taking: Reported on 12/20/2022) 30 capsule 0   ibuprofen (ADVIL) 600 MG tablet Take 1 tablet (600 mg total) by mouth every 8 (eight) hours as needed. 30 tablet 0   methylPREDNISolone (MEDROL DOSEPAK) 4 MG TBPK tablet Take with signs of chronic sinusitis and take as directed (Patient not taking: Reported on 12/20/2022) 1 each 1   Multiple Vitamin (MULTIVITAMIN) tablet Take 1 tablet by mouth daily.     neomycin-polymyxin-hydrocortisone (CORTISPORIN) OTIC solution Place 3 drops into the right ear 4 (four) times daily. X 7 days 10 mL 0   ondansetron (ZOFRAN-ODT) 4 MG disintegrating tablet Take 1 tablet (4 mg total) by mouth every 8 (eight) hours as needed for nausea or vomiting. 20 tablet 0   Probiotic Product (PROBIOTIC DAILY PO) Take by mouth.     Riboflavin 400 MG CAPS Take 1 capsule (400 mg total) by mouth daily. (Patient not taking: Reported on 12/20/2022) 90 capsule 2   Spacer/Aero-Holding Chambers DEVI 1 each by Does not apply route as needed. 1 each 0   SUMAtriptan (IMITREX) 100 MG tablet Take 1 tablet by mouth every 2  hours as needed for migraine. May repeat in 2 hours if headache persists or recurs.Wait at least 2 hours between doses (max 2tabs/24 hours) 10 tablet 0   No current facility-administered medications on file prior to visit.    ALLERGIES: Allergies  Allergen Reactions   Ivp Dye [Iodinated Contrast Media] Shortness Of Breath and Rash   Sulfa Antibiotics Nausea And Vomiting   Codeine Other (See Comments)    "I'm Delusional"   Latex Hives   Morphine And Codeine Other (See Comments)    "bottoms out my blood pressure"   Topamax [Topiramate]     Hot flashes, leg pain   Vicodin [Hydrocodone-Acetaminophen] Other (See Comments)    Hallucinations: "I see spiders"    FAMILY HISTORY: Family History  Problem Relation  Age of Onset   Breast cancer Mother    Breast cancer Sister    Asthma Sister    Breast cancer Maternal Grandmother    Asthma Brother    Allergic rhinitis Neg Hx    Eczema Neg Hx    Immunodeficiency Neg Hx    Urticaria Neg Hx    Angioedema Neg Hx     Objective:  *** General: No acute distress.  Patient appears well-groomed.   Head:  Normocephalic/atraumatic Eyes:  fundi examined but not visualized Neck: supple, no paraspinal tenderness, full range of motion Back: No paraspinal tenderness Heart: regular rate and rhythm Lungs: Clear to auscultation bilaterally. Vascular: No carotid bruits. Neurological Exam: Mental status: alert and oriented to person, place, and time, speech fluent and not dysarthric, language intact. Cranial nerves: CN I: not tested CN II: pupils equal, round and reactive to light, visual fields intact CN III, IV, VI:  full range of motion, no nystagmus, no ptosis CN V: facial sensation intact. CN VII: upper and lower face symmetric CN VIII: hearing intact CN IX, X: gag intact, uvula midline CN XI: sternocleidomastoid and trapezius muscles intact CN XII: tongue midline Bulk & Tone: normal, no  fasciculations. Motor:  muscle strength 5/5 throughout Sensation:  Pinprick, temperature and vibratory sensation intact. Deep Tendon Reflexes:  2+ throughout,  toes downgoing.   Finger to nose testing:  Without dysmetria.   Heel to shin:  Without dysmetria.   Gait:  Normal station and stride.  Romberg negative.    Thank you for allowing me to take part in the care of this patient.  Shon Millet, DO  CC: ***

## 2022-12-31 ENCOUNTER — Ambulatory Visit: Payer: 59 | Admitting: Neurology

## 2023-01-10 ENCOUNTER — Encounter (HOSPITAL_COMMUNITY): Payer: Self-pay

## 2023-01-10 ENCOUNTER — Ambulatory Visit (INDEPENDENT_AMBULATORY_CARE_PROVIDER_SITE_OTHER): Payer: 59 | Admitting: Family Medicine

## 2023-01-10 ENCOUNTER — Other Ambulatory Visit (HOSPITAL_COMMUNITY): Payer: Self-pay

## 2023-01-10 ENCOUNTER — Encounter: Payer: Self-pay | Admitting: Family Medicine

## 2023-01-10 VITALS — BP 104/72 | HR 103 | Ht 69.0 in | Wt 184.0 lb

## 2023-01-10 DIAGNOSIS — R7301 Impaired fasting glucose: Secondary | ICD-10-CM | POA: Diagnosis not present

## 2023-01-10 DIAGNOSIS — G43019 Migraine without aura, intractable, without status migrainosus: Secondary | ICD-10-CM | POA: Diagnosis not present

## 2023-01-10 DIAGNOSIS — K219 Gastro-esophageal reflux disease without esophagitis: Secondary | ICD-10-CM | POA: Diagnosis not present

## 2023-01-10 DIAGNOSIS — E038 Other specified hypothyroidism: Secondary | ICD-10-CM

## 2023-01-10 DIAGNOSIS — F321 Major depressive disorder, single episode, moderate: Secondary | ICD-10-CM | POA: Diagnosis not present

## 2023-01-10 DIAGNOSIS — E559 Vitamin D deficiency, unspecified: Secondary | ICD-10-CM | POA: Diagnosis not present

## 2023-01-10 DIAGNOSIS — E7849 Other hyperlipidemia: Secondary | ICD-10-CM | POA: Diagnosis not present

## 2023-01-10 DIAGNOSIS — E538 Deficiency of other specified B group vitamins: Secondary | ICD-10-CM | POA: Diagnosis not present

## 2023-01-10 DIAGNOSIS — Z91018 Allergy to other foods: Secondary | ICD-10-CM

## 2023-01-10 MED ORDER — EPINEPHRINE 0.3 MG/0.3ML IJ SOAJ
0.3000 mg | INTRAMUSCULAR | 0 refills | Status: AC | PRN
Start: 2023-01-10 — End: ?
  Filled 2023-01-10 – 2023-01-11 (×2): qty 2, 2d supply, fill #0

## 2023-01-10 NOTE — Assessment & Plan Note (Addendum)
The patient is stable on Wellbutrin 200 mg daily and denies any suicidal thoughts or ideation. We reviewed nonpharmacological management strategies, including deep breathing exercises, meditation, mindfulness, ensuring adequate rest, and adhering to a heart-healthy diet with increased physical activity. The patient verbalized understanding and is aware of the plan of care. Flowsheet Row Office Visit from 01/10/2023 in [facility]  PHQ-9 Total Score 10

## 2023-01-10 NOTE — Patient Instructions (Signed)
I appreciate the opportunity to provide care to you today!    Follow up:  3 months  Labs: please stop by the lab during the week to get your blood drawn (CBC, CMP, TSH, Lipid profile, HgA1c, Vit D)  Here are some foods to avoid or reduce in your diet to help manage cholesterol levels:  Fried Foods:Deep-fried items such as french fries, fried chicken, and fried snacks are high in unhealthy fats and can raise LDL (bad) cholesterol levels. Processed Meats:Foods like bacon, sausage, hot dogs, and deli meats are often high in saturated fat and cholesterol. Full-Fat Dairy Products:Whole milk, full-fat yogurt, butter, cream, and cheese are rich in saturated fats, which can increase cholesterol levels. Baked Goods and Sweets:Pastries, cakes, cookies, and donuts often contain trans fats and added sugars, which can raise LDL cholesterol and lower HDL (good) cholesterol. Red Meat:Beef, lamb, and pork are high in saturated fat. Lean cuts or plant-based protein alternatives are better options. Lard and Shortening:Used in some baked goods, lard and shortening are high in trans fats and should be avoided. Fast Food:Many fast food items are cooked with unhealthy oils and contain high amounts of saturated and trans fats. Processed Snacks:Chips, crackers, and certain microwave popcorns can contain trans fats and high levels of unhealthy oils. Shellfish:While nutritious in other ways, some shellfish like shrimp, lobster, and crab are high in cholesterol. They should be consumed in moderation. Coconut and Palm Oils:these oils are high in saturated fat and can raise cholesterol levels when used in cooking or baking.      Please continue to a heart-healthy diet and increase your physical activities. Try to exercise for at least five days a week.    It was a pleasure to see you and I look forward to continuing to work together on your health and well-being. Please do not hesitate to call the office if you  need care or have questions about your care.  In case of emergency, please visit the Emergency Department for urgent care, or contact our clinic at [phone] to schedule an appointment. We're here to help you!   Have a wonderful day and week. With Gratitude, [name] MSN, FNP-BC

## 2023-01-10 NOTE — Assessment & Plan Note (Signed)
Refill EpiPen Asymptomatic in the clinic

## 2023-01-10 NOTE — Assessment & Plan Note (Signed)
The patient complains of more frequent migraine headaches, likely due to increased stress. She has been taking sumatriptan 100 mg as needed and reports relief with her current treatment regimen. I advised the patient to inform me if her current treatment regimen becomes ineffective, and I will adjust the treatment as needed.  In addition, I encouraged non-medication approaches to help manage migraines, including: Applying a cool compress to the forehead or back of the neck to reduce pain. Resting in a dark, quiet room to minimize light and sound sensitivity. Staying well-hydrated and avoiding migraine triggers. I also explained when to seek emergency care:  If the patient experiences a severe or unusual headache different from their usual migraines. Neurological symptoms, such as weakness, numbness, difficulty speaking, or visual disturbances. A headache associated with a high fever, stiff neck, or rash, which could indicate a serious infection or other medical condition.

## 2023-01-10 NOTE — Assessment & Plan Note (Signed)
Stable GERD diet encouraged

## 2023-01-10 NOTE — Progress Notes (Signed)
Established Patient Office Visit  Subjective:  Patient ID: Lisa Fields, female    DOB: 01-16-1981  Age: 42 y.o. MRN: [id number]  CC:  Chief Complaint  Patient presents with   Care Management    3 month f/u    HPI Lisa Fields is a 42 y.o. female with past medical history of anxiety depression, migraines, and GERD presents for f/u of  chronic medical conditions. For the details of today's visit, please refer to the assessment and plan.    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  April 2019   Bronchitis    Cervical lymphadenitis 11/10/2015   Chronic fatigue 11/10/2015   Chronic headaches    Fatigue 11/10/2015   Fibromyalgia 11/10/2015   Myalgia 11/10/2015   Ovarian cyst    Smoker 11/10/2015    Past Surgical History:  Procedure Laterality Date   APPENDECTOMY     BREAST BIOPSY Left 11/24/2017   fibrocystic change benign   OVARIAN CYST SURGERY     TUBAL LIGATION      Family History  Problem Relation Age of Onset   Breast cancer Mother    Breast cancer Sister    Asthma Sister    Breast cancer Maternal Grandmother    Asthma Brother    Allergic rhinitis Neg Hx    Eczema Neg Hx    Immunodeficiency Neg Hx    Urticaria Neg Hx    Angioedema Neg Hx     Social History   Socioeconomic History   Marital status: Significant Other    Spouse name: Not on file   Number of children: Not on file   Years of education: Not on file   Highest education level: Some college, no degree  Occupational History   Not on file  Tobacco Use   Smoking status: Every Day    Current packs/day: 0.50    Average packs/day: 0.5 packs/day for 9.5 years (4.7 ttl pk-yrs)    Types: Cigarettes    Start date: 07/23/2013   Smokeless tobacco: Never  Substance and Sexual Activity   Alcohol use: No   Drug use: No   Sexual activity: Yes    Birth control/protection: Surgical  Other Topics Concern    Not on file  Social History Narrative   Not on file   Social Drivers of Health   Financial Resource Strain: Medium Risk (08/18/2022)   Overall Financial Resource Strain (CARDIA)    Difficulty of Paying Living Expenses: Somewhat hard  Food Insecurity: Food Insecurity Present (08/18/2022)   Hunger Vital Sign    Worried About Running Out of Food in the Last Year: Sometimes true    Ran Out of Food in the Last Year: Sometimes true  Transportation Needs: No Transportation Needs (08/18/2022)   PRAPARE - Administrator, Civil Service (Medical): No    Lack of Transportation (Non-Medical): No  Physical Activity: Insufficiently Active (08/18/2022)   Exercise Vital Sign    Days of Exercise per Week: 3 days    Minutes of Exercise per Session: 30 min  Stress: Stress Concern Present (08/18/2022)   [employer] of Occupational Health - Occupational Stress Questionnaire    Feeling of Stress : To some extent  Social Connections: Unknown (08/18/2022)   Social Connection and Isolation Panel [NHANES]    Frequency of Communication with Friends and Family: More than three times a week  Frequency of Social Gatherings with Friends and Family: More than three times a week    Attends Religious Services: Not on file    Active Member of Clubs or Organizations: Yes    Attends Engineer, structural: More than 4 times per year    Marital Status: Living with partner  Intimate Partner Violence: Not on file    Outpatient Medications Prior to Visit  Medication Sig Dispense Refill   albuterol (VENTOLIN HFA) 108 (90 Base) MCG/ACT inhaler Inhale 2 puffs into the lungs every 6 (six) hours as needed for wheezing or shortness of breath. 8 g 0   amoxicillin-clavulanate (AUGMENTIN) 875-125 MG tablet Take 1 tablet by mouth 2 (two) times daily. 28 tablet 0   aspirin-acetaminophen-caffeine (EXCEDRIN MIGRAINE) 250-250-65 MG tablet Take 2 tablets by mouth every 6 (six) hours as needed for headache.      buPROPion (WELLBUTRIN SR) 200 MG 12 hr tablet Take 1 tablet (200 mg total) by mouth daily. 90 tablet 1   cetirizine-pseudoephedrine (ZYRTEC-D) 5-120 MG tablet Take 1 tablet by mouth daily. 30 tablet 0   Cholecalciferol (VITAMIN D PO) Take by mouth.     fexofenadine (ALLEGRA) 180 MG tablet Take 1 tablet (180 mg total) by mouth daily. 90 tablet 3   fluticasone (FLONASE) 50 MCG/ACT nasal spray Place 1 spray into both nostrils 2 (two) times daily. 16 g 2   hydrOXYzine (VISTARIL) 25 MG capsule Take 1 capsule (25 mg total) by mouth every 8 (eight) hours as needed. 30 capsule 0   ibuprofen (ADVIL) 600 MG tablet Take 1 tablet (600 mg total) by mouth every 8 (eight) hours as needed. 30 tablet 0   methylPREDNISolone (MEDROL DOSEPAK) 4 MG TBPK tablet Take with signs of chronic sinusitis and take as directed 1 each 1   Multiple Vitamin (MULTIVITAMIN) tablet Take 1 tablet by mouth daily.     neomycin-polymyxin-hydrocortisone (CORTISPORIN) OTIC solution Place 3 drops into the right ear 4 (four) times daily. X 7 days 10 mL 0   ondansetron (ZOFRAN-ODT) 4 MG disintegrating tablet Take 1 tablet (4 mg total) by mouth every 8 (eight) hours as needed for nausea or vomiting. 20 tablet 0   Probiotic Product (PROBIOTIC DAILY PO) Take by mouth.     Riboflavin 400 MG CAPS Take 1 capsule (400 mg total) by mouth daily. 90 capsule 2   Spacer/Aero-Holding Chambers DEVI 1 each by Does not apply route as needed. 1 each 0   SUMAtriptan (IMITREX) 100 MG tablet Take 1 tablet by mouth every 2  hours as needed for migraine. May repeat in 2 hours if headache persists or recurs.Wait at least 2 hours between doses (max 2tabs/24 hours) 10 tablet 0   EPINEPHrine 0.3 mg/0.3 mL IJ SOAJ injection   0   No facility-administered medications prior to visit.    Allergies  Allergen Reactions   Ivp Dye [Iodinated Contrast Media] Shortness Of Breath and Rash   Sulfa Antibiotics Nausea And Vomiting   Codeine Other (See Comments)    "I'm  Delusional"   Latex Hives   Morphine And Codeine Other (See Comments)    "bottoms out my blood pressure"   Topamax [Topiramate]     Hot flashes, leg pain   Vicodin [Hydrocodone-Acetaminophen] Other (See Comments)    Hallucinations: "I see spiders"    ROS Review of Systems  Constitutional:  Negative for chills and fever.  Eyes:  Negative for visual disturbance.  Respiratory:  Negative for chest tightness and shortness of breath.  Neurological:  Positive for headaches. Negative for dizziness.      Objective:    Physical Exam HENT:     Head: Normocephalic.     Mouth/Throat:     Mouth: Mucous membranes are moist.  Cardiovascular:     Rate and Rhythm: Normal rate.     Heart sounds: Normal heart sounds.  Pulmonary:     Effort: Pulmonary effort is normal.     Breath sounds: Normal breath sounds.  Neurological:     Mental Status: She is alert.     BP 104/72   Pulse (!) 103   Ht 5\' 9"  (1.753 m)   Wt 184 lb 0.6 oz (83.5 kg)   SpO2 96%   BMI 27.18 kg/m  Wt Readings from Last 3 Encounters:  01/10/23 184 lb 0.6 oz (83.5 kg)  10/08/22 181 lb 12.8 oz (82.5 kg)  10/04/22 180 lb 0.6 oz (81.7 kg)    Lab Results  Component Value Date   TSH 2.940 03/31/2022   Lab Results  Component Value Date   WBC 5.2 03/31/2022   HGB 13.5 03/31/2022   HCT 40.0 03/31/2022   MCV 93 03/31/2022   PLT 282 03/31/2022   Lab Results  Component Value Date   NA 138 03/31/2022   K 4.2 03/31/2022   CO2 23 03/31/2022   GLUCOSE 86 03/31/2022   BUN 7 03/31/2022   CREATININE 0.79 03/31/2022   BILITOT 0.3 03/31/2022   ALKPHOS 58 03/31/2022   AST 14 03/31/2022   ALT 13 03/31/2022   PROT 6.5 03/31/2022   ALBUMIN 4.4 03/31/2022   CALCIUM 9.0 03/31/2022   ANIONGAP 8 06/15/2017   EGFR 96 03/31/2022   Lab Results  Component Value Date   CHOL 176 03/31/2022   Lab Results  Component Value Date   HDL 48 03/31/2022   Lab Results  Component Value Date   LDLCALC 114 (H) 03/31/2022   Lab  Results  Component Value Date   TRIG 72 03/31/2022   Lab Results  Component Value Date   CHOLHDL 3.7 03/31/2022   Lab Results  Component Value Date   HGBA1C 5.0 03/31/2022      Assessment & Plan:  Intractable migraine without aura and without status migrainosus Assessment & Plan: The patient complains of more frequent migraine headaches, likely due to increased stress. She has been taking sumatriptan 100 mg as needed and reports relief with her current treatment regimen. I advised the patient to inform me if her current treatment regimen becomes ineffective, and I will adjust the treatment as needed.  In addition, I encouraged non-medication approaches to help manage migraines, including: Applying a cool compress to the forehead or back of the neck to reduce pain. Resting in a dark, quiet room to minimize light and sound sensitivity. Staying well-hydrated and avoiding migraine triggers. I also explained when to seek emergency care:  If the patient experiences a severe or unusual headache different from their usual migraines. Neurological symptoms, such as weakness, numbness, difficulty speaking, or visual disturbances. A headache associated with a high fever, stiff neck, or rash, which could indicate a serious infection or other medical condition.    Allergy to alpha-gal Assessment & Plan: Refill EpiPen Asymptomatic in the clinic  Orders: -     EPINEPHrine; Inject 0.3 mg into the muscle as needed for anaphylaxis.  Dispense: 2 each; Refill: 0  Gastroesophageal reflux disease without esophagitis Assessment & Plan: Stable GERD diet encouraged   Current moderate episode of major depressive  disorder, unspecified whether recurrent [facility]) Assessment & Plan: The patient is stable on Wellbutrin 200 mg daily and denies any suicidal thoughts or ideation. We reviewed nonpharmacological management strategies, including deep breathing exercises, meditation, mindfulness, ensuring  adequate rest, and adhering to a heart-healthy diet with increased physical activity. The patient verbalized understanding and is aware of the plan of care. Flowsheet Row Office Visit from 01/10/2023 in [facility]  PHQ-9 Total Score 10              IFG (impaired fasting glucose) -     Hemoglobin A1c  Vitamin D deficiency -     VITAMIN D 25 Hydroxy (Vit-D Deficiency, Fractures)  TSH (thyroid-stimulating hormone deficiency) -     TSH + free T4  Other hyperlipidemia -     Lipid panel -     CMP14+EGFR -     CBC with Differential/Platelet  B12 deficiency -     Vitamin B12  Note: This chart has been completed using Engineer, civil (consulting) software, and while attempts have been made to ensure accuracy, certain words and phrases may not be transcribed as intended.    Follow-up: Return in about 3 months (around 04/10/2023).   [name], FNP

## 2023-01-11 ENCOUNTER — Other Ambulatory Visit (HOSPITAL_COMMUNITY): Payer: Self-pay

## 2023-01-11 ENCOUNTER — Other Ambulatory Visit: Payer: Self-pay

## 2023-01-11 ENCOUNTER — Encounter: Payer: Self-pay | Admitting: Neurology

## 2023-01-12 ENCOUNTER — Other Ambulatory Visit: Payer: Self-pay

## 2023-01-20 DIAGNOSIS — R7301 Impaired fasting glucose: Secondary | ICD-10-CM | POA: Diagnosis not present

## 2023-01-20 DIAGNOSIS — E559 Vitamin D deficiency, unspecified: Secondary | ICD-10-CM | POA: Diagnosis not present

## 2023-01-20 DIAGNOSIS — E7849 Other hyperlipidemia: Secondary | ICD-10-CM | POA: Diagnosis not present

## 2023-01-20 DIAGNOSIS — E038 Other specified hypothyroidism: Secondary | ICD-10-CM | POA: Diagnosis not present

## 2023-01-21 LAB — CBC WITH DIFFERENTIAL/PLATELET
Basophils Absolute: 0 10*3/uL (ref 0.0–0.2)
Basos: 1 %
EOS (ABSOLUTE): 0.1 10*3/uL (ref 0.0–0.4)
Eos: 2 %
Hematocrit: 41.1 % (ref 34.0–46.6)
Hemoglobin: 13.7 g/dL (ref 11.1–15.9)
Immature Grans (Abs): 0 10*3/uL (ref 0.0–0.1)
Immature Granulocytes: 0 %
Lymphocytes Absolute: 2.4 10*3/uL (ref 0.7–3.1)
Lymphs: 38 %
MCH: 30.5 pg (ref 26.6–33.0)
MCHC: 33.3 g/dL (ref 31.5–35.7)
MCV: 92 fL (ref 79–97)
Monocytes Absolute: 0.7 10*3/uL (ref 0.1–0.9)
Monocytes: 12 %
Neutrophils Absolute: 3 10*3/uL (ref 1.4–7.0)
Neutrophils: 47 %
Platelets: 313 10*3/uL (ref 150–450)
RBC: 4.49 x10E6/uL (ref 3.77–5.28)
RDW: 12.3 % (ref 11.7–15.4)
WBC: 6.3 10*3/uL (ref 3.4–10.8)

## 2023-01-21 LAB — CMP14+EGFR
ALT: 12 [IU]/L (ref 0–32)
AST: 16 [IU]/L (ref 0–40)
Albumin: 4.6 g/dL (ref 3.9–4.9)
Alkaline Phosphatase: 62 [IU]/L (ref 44–121)
BUN/Creatinine Ratio: 11 (ref 9–23)
BUN: 9 mg/dL (ref 6–24)
Bilirubin Total: 0.5 mg/dL (ref 0.0–1.2)
CO2: 20 mmol/L (ref 20–29)
Calcium: 9.1 mg/dL (ref 8.7–10.2)
Chloride: 104 mmol/L (ref 96–106)
Creatinine, Ser: 0.85 mg/dL (ref 0.57–1.00)
Globulin, Total: 2.5 g/dL (ref 1.5–4.5)
Glucose: 82 mg/dL (ref 70–99)
Potassium: 4.3 mmol/L (ref 3.5–5.2)
Sodium: 138 mmol/L (ref 134–144)
Total Protein: 7.1 g/dL (ref 6.0–8.5)
eGFR: 88 mL/min/{1.73_m2} (ref >59)

## 2023-01-21 LAB — LIPID PANEL
Chol/HDL Ratio: 4.3 {ratio} (ref 0.0–4.4)
Cholesterol, Total: 186 mg/dL (ref 100–199)
HDL: 43 mg/dL (ref >39)
LDL Chol Calc (NIH): 121 mg/dL — ABNORMAL HIGH (ref 0–99)
Triglycerides: 123 mg/dL (ref 0–149)
VLDL Cholesterol Cal: 22 mg/dL (ref 5–40)

## 2023-01-21 LAB — HEMOGLOBIN A1C
Est. average glucose Bld gHb Est-mCnc: 103 mg/dL
Hgb A1c MFr Bld: 5.2 % (ref 4.8–5.6)

## 2023-01-21 LAB — TSH+FREE T4
Free T4: 1.36 ng/dL (ref 0.82–1.77)
TSH: 2.58 u[IU]/mL (ref 0.450–4.500)

## 2023-01-21 LAB — VITAMIN D 25 HYDROXY (VIT D DEFICIENCY, FRACTURES): Vit D, 25-Hydroxy: 32.4 ng/mL (ref 30.0–100.0)

## 2023-02-01 ENCOUNTER — Ambulatory Visit (INDEPENDENT_AMBULATORY_CARE_PROVIDER_SITE_OTHER): Payer: 59 | Admitting: Audiology

## 2023-02-10 ENCOUNTER — Ambulatory Visit (INDEPENDENT_AMBULATORY_CARE_PROVIDER_SITE_OTHER): Payer: 59 | Admitting: Otolaryngology

## 2023-02-14 ENCOUNTER — Other Ambulatory Visit (HOSPITAL_COMMUNITY): Payer: Self-pay

## 2023-02-14 ENCOUNTER — Other Ambulatory Visit: Payer: Self-pay | Admitting: Family Medicine

## 2023-02-14 DIAGNOSIS — F32A Depression, unspecified: Secondary | ICD-10-CM

## 2023-02-14 MED ORDER — BUPROPION HCL ER (SR) 200 MG PO TB12
200.0000 mg | ORAL_TABLET | Freq: Every day | ORAL | 1 refills | Status: DC
Start: 2023-02-14 — End: 2023-06-30
  Filled 2023-02-14: qty 90, 90d supply, fill #0

## 2023-03-22 ENCOUNTER — Telehealth (INDEPENDENT_AMBULATORY_CARE_PROVIDER_SITE_OTHER): Payer: 59 | Admitting: Family Medicine

## 2023-03-22 ENCOUNTER — Ambulatory Visit: Payer: Self-pay | Admitting: Family Medicine

## 2023-03-22 ENCOUNTER — Encounter: Payer: Self-pay | Admitting: Family Medicine

## 2023-03-22 VITALS — BP 113/80 | HR 107 | Temp 101.0°F

## 2023-03-22 DIAGNOSIS — R110 Nausea: Secondary | ICD-10-CM | POA: Diagnosis not present

## 2023-03-22 DIAGNOSIS — Z20828 Contact with and (suspected) exposure to other viral communicable diseases: Secondary | ICD-10-CM | POA: Diagnosis not present

## 2023-03-22 DIAGNOSIS — R051 Acute cough: Secondary | ICD-10-CM | POA: Diagnosis not present

## 2023-03-22 DIAGNOSIS — R0602 Shortness of breath: Secondary | ICD-10-CM | POA: Diagnosis not present

## 2023-03-22 MED ORDER — ALBUTEROL SULFATE HFA 108 (90 BASE) MCG/ACT IN AERS
2.0000 | INHALATION_SPRAY | Freq: Four times a day (QID) | RESPIRATORY_TRACT | 0 refills | Status: AC | PRN
Start: 2023-03-22 — End: ?

## 2023-03-22 MED ORDER — BENZONATATE 200 MG PO CAPS: 200.0000 mg | ORAL_CAPSULE | Freq: Three times a day (TID) | ORAL | 0 refills | Status: DC | PRN

## 2023-03-22 MED ORDER — ONDANSETRON 4 MG PO TBDP: 4.0000 mg | ORAL_TABLET | Freq: Three times a day (TID) | ORAL | 0 refills | Status: DC | PRN

## 2023-03-22 MED ORDER — OSELTAMIVIR PHOSPHATE 75 MG PO CAPS: 75.0000 mg | ORAL_CAPSULE | Freq: Two times a day (BID) | ORAL | 0 refills | Status: AC

## 2023-03-22 NOTE — Assessment & Plan Note (Signed)
 Likely secondary to influenza-like illness. Current smoker, no cigarettes in 3 days. No history of asthma. -Start Albuterol inhaler as needed. -Consider hot teas, honey, lemon, and humidified air to help with mucus production.

## 2023-03-22 NOTE — Telephone Encounter (Signed)
 Chief Complaint: Flu-like symptoms  Symptoms: Fatigue, cough, congestion, post-nasal drip, body aches, headache, fever, nausea Frequency: constant, onset Sunday evening  Pertinent Negatives: Patient denies Chest pain,vomiting, SOB Disposition: []ED /[]Urgent Care (no appt availability in office) / [x]Appointment(In office/virtual)/ [] [facility]/ []Home Care/ []Refused Recommended Disposition /[][place] [facility]/ [] Follow-up with PCP Additional Notes: Patient states she started feeling sick Sunday evening with a cough. Patient stated she woke yesterday and felt even worse. Care advice was given and patient states she is feeling so fatigued but was not able to sleep much due to er symptoms. Patient requested a video visit. Patient has been scheduled with a  provider for a video visit today.    Summary: Cold/Flu symptoms, seeking appt today   Copied From CRM #[id number]. Reason for Triage: Fever 100.8, drainage, cough, congestion, headache, covid negative tested, seeking appt today nothing available at PCP office. Scheduled for Thursday.  Best contact: [phone]     Reason for Disposition  Fever present > 3 days (72 hours)  Answer Assessment - Initial Assessment Questions 1. WORST SYMPTOM: "What is your worst symptom?" (e.g., cough, runny nose, muscle aches, headache, sore throat, fever)      Fatigue  2. ONSET: "When did your flu symptoms start?"      Sunday evening  3. COUGH: "How bad is the cough?"       Moderate to Severe  4. RESPIRATORY DISTRESS: "Describe your breathing."      Normal  5. FEVER: "Do you have a fever?" If Yes, ask: "What is your temperature, how was it measured, and when did it start?"     10 0.8 6. EXPOSURE: "Were you exposed to someone with influenza?"       One of my co-workers have the flu  7. FLU VACCINE: "Did you get a flu shot this year?"     Yes  8. HIGH RISK DISEASE: "Do you have any chronic medical problems?" (e.g., heart or lung  disease, asthma, weak immune system, or other HIGH RISK conditions)     I'm a smoker and have a weak immune system  10. OTHER SYMPTOMS: "Do you have any other symptoms?"  (e.g., runny nose, muscle aches, headache, sore throat)       Congestion, headache, cough, body ache, nausea, fatigue, post nasal drip  Protocols used: Influenza (Flu) - [facility]

## 2023-03-22 NOTE — Telephone Encounter (Signed)
 Patient called, unable to leave VM to return the call to the office to speak to the NT.  Will try again.   Summary: Cold/Flu symptoms, seeking appt today   Copied From CRM [id number]. Reason for Triage: Fever 100.8, drainage, cough, congestion, headache, covid negative tested, seeking appt today nothing available at PCP office. Scheduled for Thursday.  Best contact: [phone]

## 2023-03-22 NOTE — Assessment & Plan Note (Signed)
 Likely secondary to illness. -Start Tessalon Perles 200mg  TID as needed.

## 2023-03-22 NOTE — Assessment & Plan Note (Signed)
 Fever, cough, chills, body aches, and exposure to influenza at work. Negative COVID-19 test. No flu test performed. -Start Tamiflu 75mg  BID for 5 days. -Continue over-the-counter cough medication. -If stomach upset, consider probiotics. -If continued trouble breathing, consider chest x-ray for possible pneumonia.

## 2023-03-22 NOTE — Assessment & Plan Note (Signed)
 Likely secondary to influenza-like illness. -Continue Zofran as needed. -If unable to keep down oral medications, consider Zofran disintegrating tablets 4mg  every 4 hours.

## 2023-03-22 NOTE — Progress Notes (Signed)
 Virtual Visit via Video Note   This visit type was conducted per patient request This format is felt to be appropriate for this patient at this time.  All issues noted in this document were discussed and addressed.  A limited physical exam was performed with this format.  A verbal consent was obtained for the virtual visit.   Date:  03/22/2023   ID:  Lisa Fields, DOB [birth year], MRN [id number]  Patient Location: Home Provider Location: Office/Clinic  PCP:  [name], FNP   Chief Complaint:  Flu like symptoms that started Sunday.  Discussed the use of AI scribe software for clinical note transcription with the patient, who gave verbal consent to proceed.   History of Present Illness:    Lisa Fields is a 43 year old female who presents with flu-like symptoms and flu exposure from work.  She has been experiencing flu-like symptoms including a non-productive cough, fever, chills, and body aches. Her appetite is decreased, but she is still able to eat. She tested negative for COVID-19 but has had flu exposure and has not been tested for the flu. She has not received the flu vaccine this year.  She reports chest tightness and shortness of breath, with occasional wheezing. No history of asthma and she does not use inhalers. She has been taking over-the-counter medications for her cough, but their effectiveness is unclear. She has not tried Psychiatrist Pearls for her cough previously.  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 She wants to return to work by Friday.  No nausea, vomiting, diarrhea, constipation, headaches, dizziness, tinnitus, or vision changes. She has a history of using Zofran for nausea and vomiting.  Chief Complaint  Patient presents with   Influenza   Fever   Fatigue  .  The patient does have symptoms concerning for COVID-19 infection (fever, chills, cough, or new shortness of breath).    Past Medical  History:  Diagnosis Date   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 05/11/2017   Patient's alpha gal was positive in regards to IgE for beef.  April 2019   Bronchitis    Cervical lymphadenitis 11/10/2015   Chronic fatigue 11/10/2015   Chronic headaches    Fatigue 11/10/2015   Fibromyalgia 11/10/2015   Myalgia 11/10/2015   Ovarian cyst    Smoker 11/10/2015    Past Surgical History:  Procedure Laterality Date   APPENDECTOMY     BREAST BIOPSY Left 11/24/2017   fibrocystic change benign   OVARIAN CYST SURGERY     TUBAL LIGATION      Family History  Problem Relation Age of Onset   Breast cancer Mother    Breast cancer Sister    Asthma Sister    Breast cancer Maternal Grandmother    Asthma Brother    Allergic rhinitis Neg Hx    Eczema Neg Hx    Immunodeficiency Neg Hx    Urticaria Neg Hx    Angioedema Neg Hx     Social History   Socioeconomic History   Marital status: Significant Other    Spouse name: Not on file   Number of children: Not on file   Years of education: Not on file   Highest education level: Some college, no degree  Occupational History   Not on file  Tobacco Use   Smoking status: Every Day    Current packs/day: 0.50    Average packs/day: 0.5 packs/day for 9.7 years (4.8 ttl pk-yrs)  Types: Cigarettes    Start date: 07/23/2013   Smokeless tobacco: Never  Substance and Sexual Activity   Alcohol use: No   Drug use: No   Sexual activity: Yes    Birth control/protection: Surgical  Other Topics Concern   Not on file  Social History Narrative   Not on file   Social Drivers of Health   Financial Resource Strain: Medium Risk (08/18/2022)   Overall Financial Resource Strain (CARDIA)    Difficulty of Paying Living Expenses: Somewhat hard  Food Insecurity: Food Insecurity Present (08/18/2022)   Hunger Vital Sign    Worried About Running Out of Food in the Last Year: Sometimes true    Ran Out of Food in the Last Year: Sometimes true  Transportation Needs: No Transportation  Needs (08/18/2022)   PRAPARE - Administrator, Civil Service (Medical): No    Lack of Transportation (Non-Medical): No  Physical Activity: Insufficiently Active (08/18/2022)   Exercise Vital Sign    Days of Exercise per Week: 3 days    Minutes of Exercise per Session: 30 min  Stress: Stress Concern Present (08/18/2022)   [employer] of Occupational Health - Occupational Stress Questionnaire    Feeling of Stress : To some extent  Social Connections: Unknown (08/18/2022)   Social Connection and Isolation Panel [NHANES]    Frequency of Communication with Friends and Family: More than three times a week    Frequency of Social Gatherings with Friends and Family: More than three times a week    Attends Religious Services: Not on Marketing executive or Organizations: Yes    Attends Engineer, structural: More than 4 times per year    Marital Status: Living with partner  Intimate Partner Violence: Not on file    Outpatient Medications Prior to Visit  Medication Sig Dispense Refill   amoxicillin-clavulanate (AUGMENTIN) 875-125 MG tablet Take 1 tablet by mouth 2 (two) times daily. 28 tablet 0   aspirin-acetaminophen-caffeine (EXCEDRIN MIGRAINE) 250-250-65 MG tablet Take 2 tablets by mouth every 6 (six) hours as needed for headache.     buPROPion (WELLBUTRIN SR) 200 MG 12 hr tablet Take 1 tablet (200 mg total) by mouth daily. 90 tablet 1   cetirizine-pseudoephedrine (ZYRTEC-D) 5-120 MG tablet Take 1 tablet by mouth daily. 30 tablet 0   Cholecalciferol (VITAMIN D PO) Take by mouth.     EPINEPHrine 0.3 mg/0.3 mL IJ SOAJ injection Inject 0.3 mg into the muscle as needed for anaphylaxis. 2 each 0   fexofenadine (ALLEGRA) 180 MG tablet Take 1 tablet (180 mg total) by mouth daily. 90 tablet 3   fluticasone (FLONASE) 50 MCG/ACT nasal spray Place 1 spray into both nostrils 2 (two) times daily. 16 g 2   hydrOXYzine (VISTARIL) 25 MG capsule Take 1 capsule (25 mg total) by  mouth every 8 (eight) hours as needed. 30 capsule 0   ibuprofen (ADVIL) 600 MG tablet Take 1 tablet (600 mg total) by mouth every 8 (eight) hours as needed. 30 tablet 0   methylPREDNISolone (MEDROL DOSEPAK) 4 MG TBPK tablet Take with signs of chronic sinusitis and take as directed 1 each 1   Multiple Vitamin (MULTIVITAMIN) tablet Take 1 tablet by mouth daily.     neomycin-polymyxin-hydrocortisone (CORTISPORIN) OTIC solution Place 3 drops into the right ear 4 (four) times daily. X 7 days 10 mL 0   Probiotic Product (PROBIOTIC DAILY PO) Take by mouth.     Riboflavin 400 MG  CAPS Take 1 capsule (400 mg total) by mouth daily. 90 capsule 2   Spacer/Aero-Holding Chambers DEVI 1 each by Does not apply route as needed. 1 each 0   SUMAtriptan (IMITREX) 100 MG tablet Take 1 tablet by mouth every 2  hours as needed for migraine. May repeat in 2 hours if headache persists or recurs.Wait at least 2 hours between doses (max 2tabs/24 hours) 10 tablet 0   albuterol (VENTOLIN HFA) 108 (90 Base) MCG/ACT inhaler Inhale 2 puffs into the lungs every 6 (six) hours as needed for wheezing or shortness of breath. 8 g 0   ondansetron (ZOFRAN-ODT) 4 MG disintegrating tablet Take 1 tablet (4 mg total) by mouth every 8 (eight) hours as needed for nausea or vomiting. 20 tablet 0   No facility-administered medications prior to visit.    Allergies  Allergen Reactions   Ivp Dye [Iodinated Contrast Media] Shortness Of Breath and Rash   Sulfa Antibiotics Nausea And Vomiting   Codeine Other (See Comments)    "I'm Delusional"   Latex Hives   Morphine And Codeine Other (See Comments)    "bottoms out my blood pressure"   Topamax [Topiramate]     Hot flashes, leg pain   Vicodin [Hydrocodone-Acetaminophen] Other (See Comments)    Hallucinations: "I see spiders"     Social History   Tobacco Use   Smoking status: Every Day    Current packs/day: 0.50    Average packs/day: 0.5 packs/day for 9.7 years (4.8 ttl pk-yrs)     Types: Cigarettes    Start date: 07/23/2013   Smokeless tobacco: Never  Substance Use Topics   Alcohol use: No   Drug use: No     Review of Systems  Constitutional:  Positive for chills, fever and malaise/fatigue.  HENT:  Positive for congestion and sore throat. Negative for sinus pain and tinnitus.   Eyes:        Vision changes - appointment next  Respiratory:  Positive for cough (non productive, mucous - grey), sputum production and shortness of breath. Negative for wheezing.   Cardiovascular:        Chest tightness  Gastrointestinal:  Positive for nausea and vomiting. Negative for constipation and diarrhea.  Neurological:  Positive for headaches. Negative for dizziness.     Labs/Other Tests and Data Reviewed:    Recent Labs: 01/20/2023: ALT 12; BUN 9; Creatinine, Ser 0.85; Hemoglobin 13.7; Platelets 313; Potassium 4.3; Sodium 138; TSH 2.580   Recent Lipid Panel Lab Results  Component Value Date/Time   CHOL 186 01/20/2023 09:54 AM   TRIG 123 01/20/2023 09:54 AM   HDL 43 01/20/2023 09:54 AM   CHOLHDL 4.3 01/20/2023 09:54 AM   LDLCALC 121 (H) 01/20/2023 09:54 AM    Wt Readings from Last 3 Encounters:  01/10/23 184 lb 0.6 oz (83.5 kg)  10/08/22 181 lb 12.8 oz (82.5 kg)  10/04/22 180 lb 0.6 oz (81.7 kg)     Objective:    Vital Signs:  BP 113/80 (BP Location: Right Arm, Patient Position: Sitting, Cuff Size: Normal)   Pulse (!) 107   Temp (!) 101 F (38.3 C) (Temporal)    Physical Exam Constitutional:      General: She is not in acute distress.    Appearance: She is ill-appearing.  Eyes:     Conjunctiva/sclera: Conjunctivae normal.  Pulmonary:     Breath sounds: Normal breath sounds. No wheezing.     Comments: coughing Musculoskeletal:     Cervical back: Normal range  of motion.  Neurological:     Mental Status: She is alert.  Psychiatric:        Mood and Affect: Mood normal.      ASSESSMENT & PLAN:   Exposure to the flu Assessment & Plan: Fever, cough,  chills, body aches, and exposure to influenza at work. Negative COVID-19 test. No flu test performed. -Start Tamiflu 75mg  BID for 5 days. -Continue over-the-counter cough medication. -If stomach upset, consider probiotics. -If continued trouble breathing, consider chest x-ray for possible pneumonia.  Orders: -     Oseltamivir Phosphate; Take 1 capsule (75 mg total) by mouth 2 (two) times daily for 5 days.  Dispense: 10 capsule; Refill: 0  Nausea Assessment & Plan: Likely secondary to influenza-like illness. -Continue Zofran as needed. -If unable to keep down oral medications, consider Zofran disintegrating tablets 4mg  every 4 hours.  Orders: -     Ondansetron; Take 1 tablet (4 mg total) by mouth every 8 (eight) hours as needed for nausea or vomiting.  Dispense: 20 tablet; Refill: 0  Acute cough Assessment & Plan: Likely secondary to illness. -Start Tessalon Perles 200mg  TID as needed.  Orders: -     Benzonatate; Take 1 capsule (200 mg total) by mouth 3 (three) times daily as needed for cough.  Dispense: 30 capsule; Refill: 0  Shortness of breath Assessment & Plan: Likely secondary to influenza-like illness. Current smoker, no cigarettes in 3 days. No history of asthma. -Start Albuterol inhaler as needed. -Consider hot teas, honey, lemon, and humidified air to help with mucus production.   Orders: -     Albuterol Sulfate HFA; Inhale 2 puffs into the lungs every 6 (six) hours as needed for wheezing or shortness of breath.  Dispense: 8 g; Refill: 0     No orders of the defined types were placed in this encounter.    Meds ordered this encounter  Medications   oseltamivir (TAMIFLU) 75 MG capsule    Sig: Take 1 capsule (75 mg total) by mouth 2 (two) times daily for 5 days.    Dispense:  10 capsule    Refill:  0   ondansetron (ZOFRAN-ODT) 4 MG disintegrating tablet    Sig: Take 1 tablet (4 mg total) by mouth every 8 (eight) hours as needed for nausea or vomiting.     Dispense:  20 tablet    Refill:  0   albuterol (VENTOLIN HFA) 108 (90 Base) MCG/ACT inhaler    Sig: Inhale 2 puffs into the lungs every 6 (six) hours as needed for wheezing or shortness of breath.    Dispense:  8 g    Refill:  0   benzonatate (TESSALON) 200 MG capsule    Sig: Take 1 capsule (200 mg total) by mouth 3 (three) times daily as needed for cough.    Dispense:  30 capsule    Refill:  0    I spent 20 minutes dedicated to the care of this patient on the date of this encounter to include face-to-face time with the patient.  Follow Up:  In Person prn  Signed, Lajuana Matte, FNP [facility] [phone]

## 2023-03-24 ENCOUNTER — Ambulatory Visit: Payer: Self-pay | Admitting: Family Medicine

## 2023-04-12 ENCOUNTER — Ambulatory Visit: Payer: 59 | Admitting: Family Medicine

## 2023-04-29 ENCOUNTER — Ambulatory Visit: Payer: 59 | Admitting: Neurology

## 2023-05-03 ENCOUNTER — Ambulatory Visit: Payer: 59 | Admitting: Neurology

## 2023-06-28 ENCOUNTER — Other Ambulatory Visit (HOSPITAL_COMMUNITY): Payer: Self-pay | Admitting: Physical Medicine & Rehabilitation

## 2023-06-28 ENCOUNTER — Other Ambulatory Visit (HOSPITAL_COMMUNITY): Payer: Self-pay

## 2023-06-28 ENCOUNTER — Encounter (HOSPITAL_COMMUNITY): Payer: Self-pay

## 2023-06-28 ENCOUNTER — Ambulatory Visit: Payer: Self-pay

## 2023-06-28 VITALS — BP 116/81 | HR 113 | Ht 69.0 in | Wt 186.0 lb

## 2023-06-28 DIAGNOSIS — G43019 Migraine without aura, intractable, without status migrainosus: Secondary | ICD-10-CM | POA: Diagnosis not present

## 2023-06-28 DIAGNOSIS — N920 Excessive and frequent menstruation with regular cycle: Secondary | ICD-10-CM

## 2023-06-28 DIAGNOSIS — G5691 Unspecified mononeuropathy of right upper limb: Secondary | ICD-10-CM

## 2023-06-28 DIAGNOSIS — Z1231 Encounter for screening mammogram for malignant neoplasm of breast: Secondary | ICD-10-CM

## 2023-06-28 DIAGNOSIS — N6320 Unspecified lump in the left breast, unspecified quadrant: Secondary | ICD-10-CM | POA: Diagnosis not present

## 2023-06-28 DIAGNOSIS — N63 Unspecified lump in unspecified breast: Secondary | ICD-10-CM

## 2023-06-28 DIAGNOSIS — R202 Paresthesia of skin: Secondary | ICD-10-CM

## 2023-06-28 DIAGNOSIS — F32A Depression, unspecified: Secondary | ICD-10-CM | POA: Diagnosis not present

## 2023-06-28 DIAGNOSIS — F419 Anxiety disorder, unspecified: Secondary | ICD-10-CM

## 2023-06-28 DIAGNOSIS — R928 Other abnormal and inconclusive findings on diagnostic imaging of breast: Secondary | ICD-10-CM

## 2023-06-28 DIAGNOSIS — M25571 Pain in right ankle and joints of right foot: Secondary | ICD-10-CM

## 2023-06-28 MED ORDER — SERTRALINE HCL 25 MG PO TABS
25.0000 mg | ORAL_TABLET | Freq: Every day | ORAL | 1 refills | Status: AC
Start: 2023-06-28 — End: ?
  Filled 2023-06-28 – 2023-06-29 (×2): qty 90, 90d supply, fill #0
  Filled 2023-09-30: qty 90, 90d supply, fill #1

## 2023-06-28 NOTE — Progress Notes (Unsigned)
 Established Patient Office Visit  Subjective   Patient ID: Lisa Fields, female    DOB: Jan 07, 1981  Age: 43 y.o. MRN: [id number]  Chief Complaint  Patient presents with   Medical Management of Chronic Issues    Pt states " Had two episodes where her whole right side goes numb after sitting for a little bit, states weakness after it occurs, pt also states with the first episode she fell"    HPI Patient reports having 2 separate episodes, the most recent 2 days ago, where she experiences numbness on the right side of her body, including her right arm and leg.  During the most recent episode she fell upon standing and twisted her right ankle.  She denies any blurred vision or slurred speech during this episode.  This episode was witnessed by her fianc and her daughter.  Her fianc wanted to take her to the ER but she declined to do so.  Patient Active Problem List   Diagnosis Date Noted   Exposure to the flu 03/22/2023   Nausea 03/22/2023   Shortness of breath 03/22/2023   Acute cough 03/22/2023   Ecchymosis 03/24/2022   Recurrent otitis externa of right ear 02/18/2022   Tinnitus of right ear 02/18/2022   Intractable migraine without aura and without status migrainosus 08/16/2020   Neuropathy of right hand 08/16/2020   Analgesic rebound headache 08/16/2020   Perennial allergic rhinitis 08/09/2017   Anaphylactic shock due to adverse food reaction 08/09/2017   Allergy to alpha-gal 05/11/2017   Insomnia 12/01/2016   Anxiety and depression 11/05/2016   Menorrhagia with regular cycle 11/05/2016   Dysmenorrhea, unspecified 11/05/2016   Gastroesophageal reflux disease 11/05/2016   Irritable bowel syndrome with both constipation and diarrhea 11/05/2016   Fatigue 11/10/2015   Fibromyalgia 11/10/2015   Smoker 11/10/2015   Chronic fatigue 11/10/2015   Depression 08/27/2014   Past Medical History:  Diagnosis Date   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 05/11/2017   Patient's alpha gal was positive in  regards to IgE for beef.  April 2019   Bronchitis    Cervical lymphadenitis 11/10/2015   Chronic fatigue 11/10/2015   Chronic headaches    Fatigue 11/10/2015   Fibromyalgia 11/10/2015   Myalgia 11/10/2015   Ovarian cyst    Smoker 11/10/2015      ROS    Objective:     BP 116/81   Pulse (!) 113   Ht 5\' 9"  (1.753 m)   Wt 186 lb 0.6 oz (84.4 kg)   SpO2 98%   BMI 27.47 kg/m  BP Readings from Last 3 Encounters:  06/28/23 116/81  03/22/23 113/80  01/10/23 104/72   Wt Readings from Last 3 Encounters:  06/28/23 186 lb 0.6 oz (84.4 kg)  01/10/23 184 lb 0.6 oz (83.5 kg)  10/08/22 181 lb 12.8 oz (82.5 kg)      Physical Exam Vitals and nursing note reviewed.  Constitutional:      Appearance: Normal appearance.  HENT:     Head: Normocephalic.     Right Ear: Tympanic membrane, ear canal and external ear normal.     Left Ear: Tympanic membrane, ear canal and external ear normal.     Nose: Nose normal.     Mouth/Throat:     Mouth: Mucous membranes are moist.     Pharynx: Oropharynx is clear.  Eyes:     Extraocular Movements: Extraocular movements intact.     Pupils: Pupils are equal, round, and reactive to light.  Cardiovascular:  Rate and Rhythm: Normal rate and regular rhythm.  Pulmonary:     Effort: Pulmonary effort is normal.     Breath sounds: Normal breath sounds.  Musculoskeletal:     Cervical back: Normal range of motion and neck supple.  Skin:    General: Skin is warm and dry.  Neurological:     Mental Status: She is alert and oriented to person, place, and time.  Psychiatric:        Mood and Affect: Mood normal.        Thought Content: Thought content normal.      Results for orders placed or performed in visit on 06/28/23  B12 and Folate Panel  Result Value Ref Range   Vitamin B-12 263 232 - 1,245 pg/mL   Folate 9.7 >3.0 ng/mL  CMP14+EGFR  Result Value Ref Range   Glucose 104 (H) 70 - 99 mg/dL   BUN 7 6 - 24 mg/dL   Creatinine, Ser 1.61  0.57 - 1.00 mg/dL   eGFR 95 >09 UE/AVW/0.98   BUN/Creatinine Ratio 9 9 - 23   Sodium 141 134 - 144 mmol/L   Potassium 4.7 3.5 - 5.2 mmol/L   Chloride 103 96 - 106 mmol/L   CO2 21 20 - 29 mmol/L   Calcium 9.6 8.7 - 10.2 mg/dL   Total Protein 7.2 6.0 - 8.5 g/dL   Albumin 4.7 3.9 - 4.9 g/dL   Globulin, Total 2.5 1.5 - 4.5 g/dL   Bilirubin Total 0.3 0.0 - 1.2 mg/dL   Alkaline Phosphatase 64 44 - 121 IU/L   AST 17 0 - 40 IU/L   ALT 14 0 - 32 IU/L    Last CBC Lab Results  Component Value Date   WBC 6.3 01/20/2023   HGB 13.7 01/20/2023   HCT 41.1 01/20/2023   MCV 92 01/20/2023   MCH 30.5 01/20/2023   RDW 12.3 01/20/2023   PLT 313 01/20/2023   Last metabolic panel Lab Results  Component Value Date   GLUCOSE 104 (H) 06/29/2023   NA 141 06/29/2023   K 4.7 06/29/2023   CL 103 06/29/2023   CO2 21 06/29/2023   BUN 7 06/29/2023   CREATININE 0.79 06/29/2023   EGFR 95 06/29/2023   CALCIUM 9.6 06/29/2023   PROT 7.2 06/29/2023   ALBUMIN 4.7 06/29/2023   LABGLOB 2.5 06/29/2023   AGRATIO 2.1 03/31/2022   BILITOT 0.3 06/29/2023   ALKPHOS 64 06/29/2023   AST 17 06/29/2023   ALT 14 06/29/2023   ANIONGAP 8 06/15/2017   Last hemoglobin A1c Lab Results  Component Value Date   HGBA1C 5.2 01/20/2023      The 10-year ASCVD risk score (Arnett DK, et al., 2019) is: 2.8%    Assessment & Plan:   Problem List Items Addressed This Visit       Cardiovascular and Mediastinum   Intractable migraine without aura and without status migrainosus   The patient complains of more frequent migraine headaches, and states that the sumatriptan  is not working as well to control these.  I recommend we have her seeing neurology for further evaluation of the symptoms as well as her recent episodes of numbness on right side of her body, as these may be related to migraines.      Relevant Medications   sertraline (ZOLOFT) 25 MG tablet   Other Relevant Orders   Ambulatory referral to Neurology      Nervous and Auditory   Neuropathy of right hand   Transient  in nature.  Symptoms not currently present.  Will check labs for further evaluation.      Relevant Medications   sertraline (ZOLOFT) 25 MG tablet   Other Relevant Orders   Ambulatory referral to Neurology     Other   Anxiety and depression   She stopped taking the Wellbutrin  due to ineffectiveness.  We will try Zoloft for ongoing anxiety and depression.  She denies any suicidal ideation at this time.  Advised to watch for worsening of anxiety and depression.  Tentative follow-up in 3 months or sooner if needed.      Relevant Medications   sertraline (ZOLOFT) 25 MG tablet   Menorrhagia with regular cycle   Update referral to OB/GYN for further evaluation and treatment of menstrual irregularities.      Relevant Orders   Ambulatory referral to Obstetrics / Gynecology   Other Visit Diagnoses       Encounter for screening mammogram for malignant neoplasm of breast    -  Primary     Mass of left breast, unspecified quadrant         Acute right ankle pain       Relevant Orders   DG Ankle Complete Right (Completed)     Paresthesia of right arm and leg       Relevant Orders   B12 and Folate Panel (Completed)   CMP14+EGFR (Completed)   Ambulatory referral to Neurology       No follow-ups on file.    Alison Irvine, FNP

## 2023-06-29 ENCOUNTER — Other Ambulatory Visit (HOSPITAL_COMMUNITY): Payer: Self-pay

## 2023-06-29 ENCOUNTER — Other Ambulatory Visit (HOSPITAL_BASED_OUTPATIENT_CLINIC_OR_DEPARTMENT_OTHER): Payer: Self-pay

## 2023-06-29 ENCOUNTER — Ambulatory Visit (HOSPITAL_COMMUNITY)
Admission: RE | Admit: 2023-06-29 | Discharge: 2023-06-29 | Disposition: A | Discharge status: Home / Self Care | Source: Ambulatory Visit

## 2023-06-29 DIAGNOSIS — M25571 Pain in right ankle and joints of right foot: Secondary | ICD-10-CM | POA: Diagnosis not present

## 2023-06-29 DIAGNOSIS — R202 Paresthesia of skin: Secondary | ICD-10-CM | POA: Diagnosis not present

## 2023-06-30 ENCOUNTER — Ambulatory Visit: Payer: Self-pay

## 2023-06-30 ENCOUNTER — Other Ambulatory Visit (HOSPITAL_COMMUNITY): Payer: Self-pay

## 2023-06-30 LAB — CMP14+EGFR
ALT: 14 IU/L (ref 0–32)
AST: 17 IU/L (ref 0–40)
Albumin: 4.7 g/dL (ref 3.9–4.9)
Alkaline Phosphatase: 64 IU/L (ref 44–121)
BUN/Creatinine Ratio: 9 (ref 9–23)
BUN: 7 mg/dL (ref 6–24)
Bilirubin Total: 0.3 mg/dL (ref 0.0–1.2)
CO2: 21 mmol/L (ref 20–29)
Calcium: 9.6 mg/dL (ref 8.7–10.2)
Chloride: 103 mmol/L (ref 96–106)
Creatinine, Ser: 0.79 mg/dL (ref 0.57–1.00)
Globulin, Total: 2.5 g/dL (ref 1.5–4.5)
Glucose: 104 mg/dL — ABNORMAL HIGH (ref 70–99)
Potassium: 4.7 mmol/L (ref 3.5–5.2)
Sodium: 141 mmol/L (ref 134–144)
Total Protein: 7.2 g/dL (ref 6.0–8.5)
eGFR: 95 mL/min/{1.73_m2} (ref >59)

## 2023-06-30 LAB — B12 AND FOLATE PANEL
Folate: 9.7 ng/mL (ref >3.0)
Vitamin B-12: 263 pg/mL (ref 232–1245)

## 2023-06-30 NOTE — Assessment & Plan Note (Signed)
 Update referral to OB/GYN for further evaluation and treatment of menstrual irregularities.

## 2023-06-30 NOTE — Assessment & Plan Note (Signed)
 She stopped taking the Wellbutrin  due to ineffectiveness.  We will try Zoloft for ongoing anxiety and depression.  She denies any suicidal ideation at this time.  Advised to watch for worsening of anxiety and depression.  Tentative follow-up in 3 months or sooner if needed.

## 2023-06-30 NOTE — Assessment & Plan Note (Signed)
 Transient in nature.  Symptoms not currently present.  Will check labs for further evaluation.

## 2023-06-30 NOTE — Assessment & Plan Note (Signed)
 The patient complains of more frequent migraine headaches, and states that the sumatriptan  is not working as well to control these.  I recommend we have her seeing neurology for further evaluation of the symptoms as well as her recent episodes of numbness on right side of her body, as these may be related to migraines.

## 2023-07-12 ENCOUNTER — Encounter: Payer: Self-pay | Admitting: Obstetrics & Gynecology

## 2023-07-12 ENCOUNTER — Other Ambulatory Visit (HOSPITAL_COMMUNITY): Payer: Self-pay

## 2023-07-12 ENCOUNTER — Ambulatory Visit: Admitting: Obstetrics & Gynecology

## 2023-07-12 VITALS — BP 112/75 | HR 96 | Ht 69.0 in | Wt 185.8 lb

## 2023-07-12 DIAGNOSIS — Z720 Tobacco use: Secondary | ICD-10-CM

## 2023-07-12 DIAGNOSIS — N939 Abnormal uterine and vaginal bleeding, unspecified: Secondary | ICD-10-CM

## 2023-07-12 MED ORDER — NORETHINDRONE 0.35 MG PO TABS
1.0000 | ORAL_TABLET | Freq: Every day | ORAL | 4 refills | Status: AC
Start: 2023-07-12 — End: ?
  Filled 2023-07-12 (×3): qty 84, 84d supply, fill #0
  Filled 2023-09-30: qty 84, 84d supply, fill #1

## 2023-07-12 NOTE — Progress Notes (Signed)
   GYN VISIT Patient name: Lisa Fields MRN [id number]  Date of birth: February 14, 1980 Chief Complaint:   Menstrual Problem (Lots of bloating before period, )  History of Present Illness:   Lisa Fields is a 43 y.o. [phone]  female being seen today for the following concerns:  -About 3 mos ago noted a change in her period- notes increased bleeding and clots.  Typically menses will last for 5 days, but it's now lasting up to 13 days.  +dysmenorrhea.  Minimal improvement with tylenol /ibuprofen . Does best with heating pack.  Prior to tubal ligation, pt had an IUD and did not have a period with this form of contraception  Does note occasional spotting after sex- sometimes, not all the time.  She also notes some constipation.  Has noted stress incontinence, but only on 3 occasions total.  Overall minimal urinary concerns.  Denies fatigue, dizziness.  Recent lab work in December- no evidence of anemia.  Pt has noted some mood changes- being seen by PCP and started on zoloft   NSVD x 2  Patient's last menstrual period was 06/21/2023 (approximate).    Review of Systems:   Pertinent items are noted in HPI Denies fever/chills, dizziness, headaches, visual disturbances, fatigue, shortness of breath, chest pain, abdominal pain, vomiting. Pertinent History Reviewed:   Past Surgical History:  Procedure Laterality Date   APPENDECTOMY     BREAST BIOPSY Left 11/24/2017   fibrocystic change benign   OVARIAN CYST SURGERY     TUBAL LIGATION      Past Medical History:  Diagnosis Date   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 05/11/2017   Patient's alpha gal was positive in regards to IgE for beef.  April 2019   Bronchitis    Cervical lymphadenitis 11/10/2015   Chronic fatigue 11/10/2015   Chronic headaches    Fatigue 11/10/2015   Fibromyalgia 11/10/2015   Myalgia 11/10/2015   Ovarian cyst    Smoker 11/10/2015   Reviewed problem list, medications and allergies. Physical Assessment:   Vitals:   07/12/23  1142  BP: 112/75  Pulse: 96  Weight: 185 lb 12.8 oz (84.3 kg)  Height: 5' 9 (1.753 m)  Body mass index is 27.44 kg/m.       Physical Examination:   General appearance: alert, well appearing, and in no distress  Psych: mood appropriate, normal affect  Skin: warm & dry   Cardiovascular: normal heart rate noted  Respiratory: normal respiratory effort, no distress  Abdomen: soft, non-tender, uterus appears well below umbilicus  Pelvic: examination not indicated  Extremities: no edema   Chaperone: N/A    Assessment & Plan:  1) AUB -plan for pelvic US  to r/o underlying etiology -pt has contraindications to COCs due to tobacco and possible migraines with aura -discussed management options ranging from POPs, Depot, IUD or surgical intervention -pt would prefer trial of pills, Rx sent in with f/u in 3-4 mos   No orders of the defined types were placed in this encounter.   Return in about 3 months (around 10/12/2023) for Medication follow up, pelvic US .   Jazmin Ley, DO Attending Obstetrician & Gynecologist, [facility], [facility]

## 2023-07-13 ENCOUNTER — Other Ambulatory Visit (HOSPITAL_COMMUNITY): Payer: Self-pay

## 2023-07-15 ENCOUNTER — Encounter: Payer: Self-pay | Admitting: Obstetrics & Gynecology

## 2023-07-18 ENCOUNTER — Other Ambulatory Visit: Payer: Self-pay | Admitting: Obstetrics & Gynecology

## 2023-07-18 DIAGNOSIS — N939 Abnormal uterine and vaginal bleeding, unspecified: Secondary | ICD-10-CM

## 2023-07-18 NOTE — Progress Notes (Signed)
 Ultrasound ordered

## 2023-07-19 ENCOUNTER — Ambulatory Visit (HOSPITAL_COMMUNITY)
Admission: RE | Admit: 2023-07-19 | Discharge: 2023-07-19 | Disposition: A | Discharge status: Home / Self Care | Source: Ambulatory Visit

## 2023-07-19 DIAGNOSIS — N63 Unspecified lump in unspecified breast: Secondary | ICD-10-CM

## 2023-07-19 DIAGNOSIS — R928 Other abnormal and inconclusive findings on diagnostic imaging of breast: Secondary | ICD-10-CM | POA: Diagnosis not present

## 2023-07-19 DIAGNOSIS — N6324 Unspecified lump in the left breast, lower inner quadrant: Secondary | ICD-10-CM | POA: Diagnosis not present

## 2023-07-19 DIAGNOSIS — R92333 Mammographic heterogeneous density, bilateral breasts: Secondary | ICD-10-CM | POA: Diagnosis not present

## 2023-07-19 DIAGNOSIS — N6325 Unspecified lump in the left breast, overlapping quadrants: Secondary | ICD-10-CM | POA: Diagnosis not present

## 2023-07-21 ENCOUNTER — Ambulatory Visit (HOSPITAL_COMMUNITY)
Admission: RE | Admit: 2023-07-21 | Discharge: 2023-07-21 | Disposition: A | Discharge status: Home / Self Care | Source: Ambulatory Visit | Attending: Obstetrics & Gynecology | Admitting: Obstetrics & Gynecology

## 2023-07-21 DIAGNOSIS — N939 Abnormal uterine and vaginal bleeding, unspecified: Secondary | ICD-10-CM | POA: Insufficient documentation

## 2023-07-28 ENCOUNTER — Encounter: Payer: Self-pay | Admitting: Obstetrics & Gynecology

## 2023-08-01 ENCOUNTER — Ambulatory Visit: Payer: Self-pay | Admitting: Obstetrics & Gynecology

## 2023-08-04 ENCOUNTER — Ambulatory Visit: Payer: Self-pay

## 2023-08-30 ENCOUNTER — Other Ambulatory Visit: Payer: Self-pay | Admitting: Medical Genetics

## 2023-09-02 ENCOUNTER — Other Ambulatory Visit (HOSPITAL_COMMUNITY)
Admission: RE | Admit: 2023-09-02 | Discharge: 2023-09-02 | Disposition: A | Discharge status: Home / Self Care | Payer: Self-pay | Source: Ambulatory Visit | Attending: Medical Genetics | Admitting: Medical Genetics

## 2023-09-12 LAB — GENECONNECT MOLECULAR SCREEN: Genetic Analysis Overall Interpretation: NEGATIVE

## 2023-09-30 ENCOUNTER — Other Ambulatory Visit (HOSPITAL_COMMUNITY): Payer: Self-pay

## 2023-09-30 ENCOUNTER — Other Ambulatory Visit: Payer: Self-pay

## 2023-09-30 ENCOUNTER — Other Ambulatory Visit: Payer: Self-pay | Admitting: Family Medicine

## 2023-09-30 DIAGNOSIS — G43109 Migraine with aura, not intractable, without status migrainosus: Secondary | ICD-10-CM

## 2023-09-30 MED ORDER — SUMATRIPTAN SUCCINATE 100 MG PO TABS
100.0000 mg | ORAL_TABLET | ORAL | 0 refills | Status: AC | PRN
  Filled 2023-09-30: qty 9, 30d supply, fill #0

## 2023-10-19 ENCOUNTER — Telehealth: Admitting: Family Medicine

## 2023-10-19 ENCOUNTER — Other Ambulatory Visit: Payer: Self-pay

## 2023-10-19 DIAGNOSIS — H60391 Other infective otitis externa, right ear: Secondary | ICD-10-CM

## 2023-10-19 MED ORDER — CIPROFLOXACIN-DEXAMETHASONE 0.3-0.1 % OT SUSP
3.0000 [drp] | Freq: Two times a day (BID) | OTIC | 0 refills | Status: AC
  Filled 2023-10-19: qty 7.5, 25d supply, fill #0

## 2023-10-19 NOTE — Progress Notes (Signed)
E Visit for Ear Pain - Swimmer's Ear  We are sorry that you are not feeling well. Here is how we plan to help!  Based on what you have shared with me it looks like you have Swimmer's Ear.  Swimmer's ear is a redness or swelling, irritation, or infection of your outer ear canal. These symptoms usually occur within a few days of swimming. Your ear canal is a tube that goes from the opening of the ear to the eardrum.  When water stays in your ear canal, germs can grow.  This is a painful condition that often happens to children and swimmers of all ages.  It is not contagious and oral antibiotics are not required to treat uncomplicated swimmer's ear.  The usual symptoms include:    Itchiness inside the ear  Redness or a sense of swelling in the ear  Pain when the ear is tugged on when pressure is placed on the ear  Pus draining from the infected ear   I have prescribed: Ciprofloxin 0.2% and hydrocortisone 1% otic suspension 3 drops in affected ears twice daily for 7 days  In certain cases, swimmer's ear may progress to a more serious bacterial infection of the middle or inner ear.  If you have a fever 102 and up and significantly worsening symptoms, this could indicate a more serious infection moving to the middle/inner and needs face to face evaluation in an office by a provider.  Your symptoms should improve over the next 3 days and should resolve in about 7 days.  Be sure to complete ALL of your prescription.  HOME CARE: Wash your hands frequently. If you are prescribed an ear drop, do not place the tip of the bottle on your ear or touch it with your fingers. You can take Acetaminophen 650 mg every 4-6 hours as needed for pain.  If pain is severe or moderate, you can apply a heating pad (set on low) or hot water bottle (wrapped in a towel) to outer ear for 20 minutes.  This will also increase drainage. Avoid ear plugs Do not go swimming until the symptoms are gone Do not use Q-tips After  showers, help the water run out by tilting your head to one side.   GET HELP RIGHT AWAY IF: Fever is over 102.2 degrees. You develop progressive ear pain or hearing loss. Ear symptoms persist longer than 3 days after treatment.  MAKE SURE YOU: Understand these instructions. Will watch your condition. Will get help right away if you are not doing well or get worse.  TO PREVENT SWIMMER'S EAR: Use a bathing cap or custom fitted swim molds to keep your ears dry. Towel off after swimming to dry your ears. Tilt your head or pull your earlobes to allow the water to escape your ear canal. If there is still water in your ears, consider using a hairdryer on the lowest setting.  Thank you for choosing an e-visit.  Your e-visit answers were reviewed by a board certified advanced clinical practitioner to complete your personal care plan. Depending upon the condition, your plan could have included both over the counter or prescription medications.  Please review your pharmacy choice. Make sure the pharmacy is open so you can pick up the prescription now. If there is a problem, you may contact your provider through [employer] and have the prescription routed to another pharmacy.  Your safety is important to [place]. If you have drug allergies check your prescription carefully.  For the next 24 hours you can use MyChart to ask questions about today's visit, request a non-urgent call back, or ask for a work or school excuse. You will get an email with a survey after your eVisit asking about your experience. We would appreciate your feedback. I hope that your e-visit has been valuable and will aid in your recovery.  I provided 5 minutes of non face-to-face time during this encounter for chart review, medication and order placement, as well as and documentation.

## 2023-10-20 ENCOUNTER — Other Ambulatory Visit: Payer: Self-pay

## 2023-10-27 ENCOUNTER — Ambulatory Visit: Admitting: Neurology

## 2023-10-27 ENCOUNTER — Encounter: Payer: Self-pay | Admitting: Neurology

## 2023-11-03 ENCOUNTER — Ambulatory Visit: Admitting: Neurology
